# Patient Record
Sex: Male | Born: 1940
Health system: Southern US, Community
[De-identification: ages and names within clinical notes are randomized; demographics above are authoritative.]

## PROBLEM LIST (undated history)

## (undated) DIAGNOSIS — I4891 Unspecified atrial fibrillation: Secondary | ICD-10-CM

## (undated) DIAGNOSIS — R040 Epistaxis: Principal | ICD-10-CM

## (undated) DIAGNOSIS — I499 Cardiac arrhythmia, unspecified: Secondary | ICD-10-CM

## (undated) DIAGNOSIS — N529 Male erectile dysfunction, unspecified: Secondary | ICD-10-CM

## (undated) DIAGNOSIS — E785 Hyperlipidemia, unspecified: Secondary | ICD-10-CM

## (undated) DIAGNOSIS — I10 Essential (primary) hypertension: Secondary | ICD-10-CM

## (undated) DIAGNOSIS — M1990 Unspecified osteoarthritis, unspecified site: Secondary | ICD-10-CM

## (undated) HISTORY — PX: APPENDECTOMY: SHX54

## (undated) HISTORY — DX: Male erectile dysfunction, unspecified: N52.9

## (undated) HISTORY — PX: NASAL MASS EXCISION: SHX2067

---

## 2003-02-04 ENCOUNTER — Encounter: Admission: RE | Admit: 2003-02-04 | Discharge: 2003-05-05 | Payer: Self-pay | Admitting: Internal Medicine

## 2003-03-20 ENCOUNTER — Ambulatory Visit (HOSPITAL_COMMUNITY): Admission: RE | Admit: 2003-03-20 | Discharge: 2003-03-20 | Payer: Self-pay | Admitting: Gastroenterology

## 2005-01-12 ENCOUNTER — Encounter: Admission: RE | Admit: 2005-01-12 | Discharge: 2005-01-12 | Payer: Self-pay | Admitting: Internal Medicine

## 2006-11-24 ENCOUNTER — Encounter (INDEPENDENT_AMBULATORY_CARE_PROVIDER_SITE_OTHER): Payer: Self-pay | Admitting: Specialist

## 2006-11-24 ENCOUNTER — Ambulatory Visit (HOSPITAL_BASED_OUTPATIENT_CLINIC_OR_DEPARTMENT_OTHER): Admission: RE | Admit: 2006-11-24 | Discharge: 2006-11-24 | Payer: Self-pay | Admitting: Otolaryngology

## 2007-03-21 ENCOUNTER — Ambulatory Visit (HOSPITAL_COMMUNITY): Admission: RE | Admit: 2007-03-21 | Discharge: 2007-03-21 | Payer: Self-pay | Admitting: Cardiology

## 2007-04-10 ENCOUNTER — Ambulatory Visit (HOSPITAL_COMMUNITY): Admission: RE | Admit: 2007-04-10 | Discharge: 2007-04-10 | Payer: Self-pay | Admitting: Cardiology

## 2008-03-31 ENCOUNTER — Emergency Department (HOSPITAL_COMMUNITY): Admission: EM | Admit: 2008-03-31 | Discharge: 2008-04-01 | Payer: Self-pay | Admitting: *Deleted

## 2008-04-26 ENCOUNTER — Emergency Department (HOSPITAL_COMMUNITY): Admission: EM | Admit: 2008-04-26 | Discharge: 2008-04-27 | Payer: Self-pay | Admitting: Emergency Medicine

## 2010-08-16 ENCOUNTER — Encounter: Payer: Self-pay | Admitting: Cardiology

## 2010-12-07 NOTE — Op Note (Signed)
Robert Irwin, Robert Irwin                    ACCOUNT NO.:  [id number]   MEDICAL RECORD NO.:  [id number]          PATIENT TYPE:  OIB   LOCATION:  2856                         FACILITY:  [facility]   PHYSICIAN:  Jake Bathe, MD      DATE OF BIRTH:  [birth year]   DATE OF PROCEDURE:  03/21/2007  DATE OF DISCHARGE:  03/21/2007                               OPERATIVE REPORT   INDICATIONS:  Atrial fibrillation.   OPERATOR:  Jake Bathe, MD   PROCEDURE:  Transesophageal echo/ cardioversion.   DESCRIPTION OF PROCEDURE:  The patient was consented.  Risks and  benefits were explained with the daughter and wife and patient all  present.  The patient was then sedated with 5 mg of Versed and 50 mcg of  fentanyl and placed under conscious sedation.  Transesophageal  echocardiogram was then performed which demonstrated no evidence of left  atrial appendage thrombus.   Following the TEE, DC cardioversion was performed under systems with  anesthesia, Dr. Katrinka Blazing; 120 joules biphasic was administered and the  patient successfully cardioverted after 1 attempt.  Sinus rhythm was  documented with EKG.  An attempt to discuss the result of the study was  made with the family, however, they were unavailable post procedure.  There was mild electrical excoriation noted on chest wall and Silvadene  cream was applied.  The patient tolerated the procedure well without any  complications.   IMPRESSION:  1. No evidence of left atrial appendage thrombus.  2. Successful DC cardioversion.   Will see patient back in clinic in 2 weeks.  Continue Coumadin.      Jake Bathe, MD  Electronically Signed     MCS/MEDQ  D:  03/23/2007  T:  03/24/2007  Job:  [id number]

## 2010-12-10 NOTE — Op Note (Signed)
   NAMEOLLEN, RAO                              ACCOUNT NO.:  [id number]   MEDICAL RECORD NO.:  [id number]                   PATIENT TYPE:  AMB   LOCATION:  ENDO                                 FACILITY:  [facility]   PHYSICIAN:  Danise Edge, M.D.                DATE OF BIRTH:  04/09/1941   DATE OF PROCEDURE:  03/20/2003  DATE OF DISCHARGE:                                 OPERATIVE REPORT   PROCEDURE:  Screening colonoscopy.   PROCEDURE INDICATIONS:  Mr. Robert Irwin is a 70 year old [place] male born 1941/05/17.  Mr. Robert Irwin is scheduled to undergo his first screening colonoscopy  with polypectomy to prevent colon cancer.   ENDOSCOPIST:  Danise Edge, M.D.   PREMEDICATION:  Versed 5 mg, Demerol 50 mg.   DESCRIPTION OF PROCEDURE:  After obtaining informed consent, Mr. Robert Irwin was  placed in the left lateral decubitus position.  I administered intravenous  Demerol and intravenous Versed to achieve conscious sedation for the  procedure.  The patient's blood pressure, oxygen saturation, and cardiac  rhythm were monitored throughout the procedure and documented in the medical  record.   Anal inspection was normal.  Digital rectal exam was normal.  The prostate  was non-nodular.  The Olympus adult colonoscope was introduced into the  rectum and advanced to the cecum.  Colonic preparation for the exam today  was excellent.   Rectum normal.   Sigmoid colon and descending colon normal.   Splenic flexure normal.   Transverse colon normal.   Hepatic flexure normal.   Ascending colon normal.   Cecum and ileocecal valve normal.    ASSESSMENT:  Normal screening proctocolonoscopy to the cecum.  No endoscopic  evidence for the presence of colorectal neoplasia.                                               Danise Edge, M.D.    MJ/MEDQ  D:  03/20/2003  T:  03/20/2003  Job:  [id number]   cc:   Georgann Housekeeper, M.D.  [address]  [place]  [place] [address]  Fax: [phone]

## 2010-12-10 NOTE — Op Note (Signed)
NAMEABRAM, SAX                    ACCOUNT NO.:  [id number]   MEDICAL RECORD NO.:  [id number]          PATIENT TYPE:  AMB   LOCATION:  DSC                          FACILITY:  [facility]   PHYSICIAN:  Christopher E. 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   OPERATION:  Excision of right intranasal mass with a cauterization with  silver nitrate.   SURGEON:  Kristine Garbe. Ezzard Standing, M.D.   ANESTHESIA:  Local, 1% Xylocaine with epinephrine.   COMPLICATIONS:  None.   CLINICAL NOTE:  Robert Irwin is a 70 year old gentleman who has been having  recurrent right sided nose bleeds.  On exam in the office he has a  polypoid mass extending from the septum on the right side consistent  with probable pyogenic granuloma.  He is taken to the operating room  under local anesthetic for excision of right nasal mass and  cauterization with silver nitrate.   DESCRIPTION OF PROCEDURE:  Nose was injected with 0.50 mL Xylocaine with  epinephrine for local anesthetic.  The polypoid lesion was removed and  sent to pathology and the base of the lesion was cauterized with silver  nitrate.  This completed the procedure.  Tee tolerated it well and is  subsequently discharged home on Tylenol p.r.n. pain.  Will have him  follow up in my office in one week to review pathology.           ______________________________  Kristine Garbe Ezzard Standing, M.D.     CEN/MEDQ  D:  11/24/2006  T:  11/24/2006  Job:  [id number]

## 2011-03-10 ENCOUNTER — Other Ambulatory Visit: Payer: Self-pay | Admitting: Internal Medicine

## 2011-03-10 DIAGNOSIS — M79652 Pain in left thigh: Secondary | ICD-10-CM

## 2011-03-11 ENCOUNTER — Ambulatory Visit
Admission: RE | Admit: 2011-03-11 | Discharge: 2011-03-11 | Disposition: A | Discharge status: Home / Self Care | Payer: Medicare Other | Source: Ambulatory Visit | Attending: Internal Medicine | Admitting: Internal Medicine

## 2011-03-11 DIAGNOSIS — M79652 Pain in left thigh: Secondary | ICD-10-CM

## 2011-03-11 MED ORDER — GADOBENATE DIMEGLUMINE 529 MG/ML IV SOLN
14.0000 mL | Freq: Once | INTRAVENOUS | Status: AC | PRN
  Administered 2011-03-11: 14 mL via INTRAVENOUS

## 2011-04-27 LAB — POCT I-STAT, CHEM 8
BUN: 18
Glucose, Bld: 135 — ABNORMAL HIGH
Potassium: 4.5
TCO2: 30

## 2011-04-27 LAB — URINALYSIS, ROUTINE W REFLEX MICROSCOPIC
Bilirubin Urine: NEGATIVE
Ketones, ur: 15 — AB
Nitrite: POSITIVE — AB
Protein, ur: 100 — AB
Urobilinogen, UA: 0.2

## 2011-04-27 LAB — URINE CULTURE

## 2011-04-27 LAB — PROTIME-INR: INR: 2.2 — ABNORMAL HIGH

## 2011-08-24 DIAGNOSIS — Z7901 Long term (current) use of anticoagulants: Secondary | ICD-10-CM | POA: Diagnosis not present

## 2011-08-24 DIAGNOSIS — I4891 Unspecified atrial fibrillation: Secondary | ICD-10-CM | POA: Diagnosis not present

## 2011-08-24 DIAGNOSIS — E119 Type 2 diabetes mellitus without complications: Secondary | ICD-10-CM | POA: Diagnosis not present

## 2011-08-24 DIAGNOSIS — E785 Hyperlipidemia, unspecified: Secondary | ICD-10-CM | POA: Diagnosis not present

## 2011-08-31 DIAGNOSIS — R040 Epistaxis: Secondary | ICD-10-CM | POA: Diagnosis not present

## 2011-08-31 DIAGNOSIS — Z7901 Long term (current) use of anticoagulants: Secondary | ICD-10-CM | POA: Diagnosis not present

## 2011-08-31 DIAGNOSIS — Z1331 Encounter for screening for depression: Secondary | ICD-10-CM | POA: Diagnosis not present

## 2011-09-23 DIAGNOSIS — E119 Type 2 diabetes mellitus without complications: Secondary | ICD-10-CM | POA: Diagnosis not present

## 2011-10-05 DIAGNOSIS — Z7901 Long term (current) use of anticoagulants: Secondary | ICD-10-CM | POA: Diagnosis not present

## 2011-10-05 DIAGNOSIS — I4891 Unspecified atrial fibrillation: Secondary | ICD-10-CM | POA: Diagnosis not present

## 2011-11-16 DIAGNOSIS — I4891 Unspecified atrial fibrillation: Secondary | ICD-10-CM | POA: Diagnosis not present

## 2011-11-16 DIAGNOSIS — Z7901 Long term (current) use of anticoagulants: Secondary | ICD-10-CM | POA: Diagnosis not present

## 2011-11-21 DIAGNOSIS — J069 Acute upper respiratory infection, unspecified: Secondary | ICD-10-CM | POA: Diagnosis not present

## 2011-12-05 DIAGNOSIS — H1130 Conjunctival hemorrhage, unspecified eye: Secondary | ICD-10-CM | POA: Diagnosis not present

## 2011-12-05 DIAGNOSIS — H40029 Open angle with borderline findings, high risk, unspecified eye: Secondary | ICD-10-CM | POA: Diagnosis not present

## 2011-12-05 DIAGNOSIS — H5789 Other specified disorders of eye and adnexa: Secondary | ICD-10-CM | POA: Diagnosis not present

## 2011-12-05 DIAGNOSIS — H5710 Ocular pain, unspecified eye: Secondary | ICD-10-CM | POA: Diagnosis not present

## 2011-12-12 DIAGNOSIS — R040 Epistaxis: Secondary | ICD-10-CM | POA: Diagnosis not present

## 2011-12-13 DIAGNOSIS — R040 Epistaxis: Secondary | ICD-10-CM | POA: Diagnosis not present

## 2011-12-14 ENCOUNTER — Inpatient Hospital Stay (HOSPITAL_COMMUNITY)
Admission: EM | Admit: 2011-12-14 | Discharge: 2011-12-18 | DRG: 151 | Disposition: A | Discharge status: Home / Self Care | Payer: Medicare Other | Source: Ambulatory Visit | Attending: Internal Medicine | Admitting: Internal Medicine

## 2011-12-14 ENCOUNTER — Encounter (HOSPITAL_COMMUNITY): Payer: Self-pay | Admitting: *Deleted

## 2011-12-14 DIAGNOSIS — I4891 Unspecified atrial fibrillation: Secondary | ICD-10-CM | POA: Diagnosis present

## 2011-12-14 DIAGNOSIS — I10 Essential (primary) hypertension: Secondary | ICD-10-CM | POA: Diagnosis present

## 2011-12-14 DIAGNOSIS — E119 Type 2 diabetes mellitus without complications: Secondary | ICD-10-CM | POA: Diagnosis not present

## 2011-12-14 DIAGNOSIS — D62 Acute posthemorrhagic anemia: Secondary | ICD-10-CM | POA: Diagnosis not present

## 2011-12-14 DIAGNOSIS — Z7982 Long term (current) use of aspirin: Secondary | ICD-10-CM | POA: Diagnosis not present

## 2011-12-14 DIAGNOSIS — R791 Abnormal coagulation profile: Secondary | ICD-10-CM | POA: Diagnosis not present

## 2011-12-14 DIAGNOSIS — T45515A Adverse effect of anticoagulants, initial encounter: Secondary | ICD-10-CM | POA: Diagnosis present

## 2011-12-14 DIAGNOSIS — R040 Epistaxis: Secondary | ICD-10-CM | POA: Diagnosis not present

## 2011-12-14 DIAGNOSIS — Z7901 Long term (current) use of anticoagulants: Secondary | ICD-10-CM

## 2011-12-14 DIAGNOSIS — R338 Other retention of urine: Secondary | ICD-10-CM | POA: Diagnosis not present

## 2011-12-14 DIAGNOSIS — D649 Anemia, unspecified: Secondary | ICD-10-CM | POA: Diagnosis not present

## 2011-12-14 DIAGNOSIS — E785 Hyperlipidemia, unspecified: Secondary | ICD-10-CM

## 2011-12-14 DIAGNOSIS — R112 Nausea with vomiting, unspecified: Secondary | ICD-10-CM

## 2011-12-14 DIAGNOSIS — I498 Other specified cardiac arrhythmias: Secondary | ICD-10-CM | POA: Diagnosis not present

## 2011-12-14 HISTORY — DX: Epistaxis: R04.0

## 2011-12-14 HISTORY — DX: Hyperlipidemia, unspecified: E78.5

## 2011-12-14 HISTORY — DX: Unspecified atrial fibrillation: I48.91

## 2011-12-14 HISTORY — DX: Essential (primary) hypertension: I10

## 2011-12-14 LAB — POCT I-STAT, CHEM 8
Creatinine, Ser: 0.9 mg/dL (ref 0.50–1.35)
Hemoglobin: 7.8 g/dL — ABNORMAL LOW (ref 13.0–17.0)
Sodium: 139 mEq/L (ref 135–145)
TCO2: 23 mmol/L (ref 0–100)

## 2011-12-14 LAB — COMPREHENSIVE METABOLIC PANEL
ALT: 14 U/L (ref 0–53)
AST: 23 U/L (ref 0–37)
Albumin: 3.3 g/dL — ABNORMAL LOW (ref 3.5–5.2)
Alkaline Phosphatase: 46 U/L (ref 39–117)
Potassium: 4.2 mEq/L (ref 3.5–5.1)
Sodium: 135 mEq/L (ref 135–145)
Total Protein: 6.1 g/dL (ref 6.0–8.3)

## 2011-12-14 LAB — CBC
MCHC: 34.4 g/dL (ref 30.0–36.0)
RBC: 2.83 MIL/uL — ABNORMAL LOW (ref 4.22–5.81)
RDW: 13 % (ref 11.5–15.5)

## 2011-12-14 LAB — GLUCOSE, CAPILLARY

## 2011-12-14 LAB — DIFFERENTIAL
Basophils Relative: 0 % (ref 0–1)
Eosinophils Absolute: 0.1 10*3/uL (ref 0.0–0.7)
Lymphs Abs: 1.7 10*3/uL (ref 0.7–4.0)
Monocytes Absolute: 0.4 10*3/uL (ref 0.1–1.0)
Monocytes Relative: 4 % (ref 3–12)
Neutro Abs: 8.5 10*3/uL — ABNORMAL HIGH (ref 1.7–7.7)

## 2011-12-14 LAB — RETICULOCYTES
Retic Count, Absolute: 87.6 10*3/uL (ref 19.0–186.0)
Retic Ct Pct: 4 % — ABNORMAL HIGH (ref 0.4–3.1)

## 2011-12-14 LAB — PROTIME-INR: Prothrombin Time: 54.4 seconds — ABNORMAL HIGH (ref 11.6–15.2)

## 2011-12-14 MED ORDER — INSULIN ASPART 100 UNIT/ML ~~LOC~~ SOLN
0.0000 [IU] | Freq: Three times a day (TID) | SUBCUTANEOUS | Status: DC
  Administered 2011-12-16: 3 [IU] via SUBCUTANEOUS
  Administered 2011-12-17 – 2011-12-18 (×3): 2 [IU] via SUBCUTANEOUS

## 2011-12-14 MED ORDER — DILTIAZEM HCL 25 MG/5ML IV SOLN: 20.0000 mg | Freq: Once | INTRAVENOUS | Status: DC

## 2011-12-14 MED ORDER — INSULIN ASPART 100 UNIT/ML ~~LOC~~ SOLN: 0.0000 [IU] | Freq: Every day | SUBCUTANEOUS | Status: DC

## 2011-12-14 MED ORDER — DILTIAZEM HCL 90 MG PO TABS
90.0000 mg | ORAL_TABLET | Freq: Three times a day (TID) | ORAL | Status: DC
  Administered 2011-12-14 – 2011-12-17 (×8): 90 mg via ORAL
  Filled 2011-12-14 (×11): qty 1

## 2011-12-14 MED ORDER — ONDANSETRON HCL 4 MG/2ML IJ SOLN: 4.0000 mg | Freq: Four times a day (QID) | INTRAMUSCULAR | Status: DC | PRN

## 2011-12-14 MED ORDER — ACETAMINOPHEN 325 MG PO TABS
650.0000 mg | ORAL_TABLET | Freq: Four times a day (QID) | ORAL | Status: DC | PRN
  Administered 2011-12-14 – 2011-12-18 (×6): 650 mg via ORAL
  Filled 2011-12-14 (×6): qty 2

## 2011-12-14 MED ORDER — LISINOPRIL 2.5 MG PO TABS
2.5000 mg | ORAL_TABLET | Freq: Every day | ORAL | Status: DC
  Filled 2011-12-14 (×3): qty 1

## 2011-12-14 MED ORDER — ONDANSETRON HCL 4 MG PO TABS: 4.0000 mg | ORAL_TABLET | Freq: Four times a day (QID) | ORAL | Status: DC | PRN

## 2011-12-14 MED ORDER — METOPROLOL TARTRATE 1 MG/ML IV SOLN
5.0000 mg | INTRAVENOUS | Status: DC | PRN
  Filled 2011-12-14: qty 5

## 2011-12-14 MED ORDER — DILTIAZEM HCL 100 MG IV SOLR
5.0000 mg/h | Freq: Once | INTRAVENOUS | Status: AC
  Administered 2011-12-14: 5 mg/h via INTRAVENOUS
  Filled 2011-12-14: qty 100

## 2011-12-14 MED ORDER — SODIUM CHLORIDE 0.9 % IJ SOLN
3.0000 mL | Freq: Two times a day (BID) | INTRAMUSCULAR | Status: DC
  Administered 2011-12-15 – 2011-12-18 (×4): 3 mL via INTRAVENOUS

## 2011-12-14 MED ORDER — GLIMEPIRIDE 1 MG PO TABS
1.0000 mg | ORAL_TABLET | Freq: Every day | ORAL | Status: DC
  Administered 2011-12-15 – 2011-12-18 (×3): 1 mg via ORAL
  Filled 2011-12-14 (×5): qty 1

## 2011-12-14 MED ORDER — SODIUM CHLORIDE 0.9 % IV SOLN
INTRAVENOUS | Status: DC
  Administered 2011-12-14 – 2011-12-16 (×4): via INTRAVENOUS

## 2011-12-14 MED ORDER — SODIUM CHLORIDE 0.9 % IV BOLUS (SEPSIS)
1000.0000 mL | INTRAVENOUS | Status: AC
  Administered 2011-12-14: 1000 mL via INTRAVENOUS

## 2011-12-14 NOTE — H&P (Signed)
PCP:   Georgann Housekeeper, MD, MD  ENT: Dr. Ezzard Standing  Chief Complaint:  Nosebleed   HPI: This is a pleasant 71 year old gentleman who developed nose bleeding on Sunday. He saw his ENT Dr. Ezzard Standing on Tuesday and Wednesday, he had cauterization done and his nares were packed today.   After each cauterization, he was sent home but continued to bleed profusely. Dr. Ezzard Standing sent to the cardiologist today where his INR was checked. Patient is on Coumadin for atrial fibrillation. His INR was 6.8, he was given vitamin K by mouth and sent to the ER. In the ER patient was tachycardic with heart rates up to 160s. The patient does report feeling dizzy and lightheaded. Today had nausea and vomiting with old blood. He also reports black stools. The patient has a history of a right nasal mass and that was excised approximately 5 years ago. Patient denies any chest pains. History provided by the patient. Dr. Ezzard Standing is aware and states he was to the patient in the a.m.  Review of Systems: Positives bolded   anorexia, fever, weight loss,, vision loss, decreased hearing, hoarseness, chest pain, syncope, dyspnea on exertion, peripheral edema, balance deficits, hemoptysis, abdominal pain, melena, hematochezia, severe indigestion/heartburn, hematuria, incontinence, genital sores, muscle weakness, suspicious skin lesions, transient blindness, difficulty walking, depression, unusual weight change, abnormal bleeding, enlarged lymph nodes, angioedema, and breast masses.  Past Medical History: Past Medical History  Diagnosis Date  . A-fib   . Hyperlipidemia   . Diabetes mellitus   . Hypertension    Past Surgical History  Procedure Date  . Nasal mass excision     Medications: Prior to Admission medications   Medication Sig Start Date End Date Taking? Authorizing Provider  aspirin 81 MG tablet Take 81 mg by mouth daily. Take three times a week.   Yes Historical Provider, MD  atorvastatin (LIPITOR) 20 MG tablet Take 20 mg by  mouth daily.   Yes Historical Provider, MD  calcium citrate-vitamin D 200-200 MG-UNIT TABS Take 1 tablet by mouth daily.   Yes Historical Provider, MD  diltiazem (TIAZAC) 240 MG 24 hr capsule Take 240 mg by mouth daily.   Yes Historical Provider, MD  fish oil-omega-3 fatty acids 1000 MG capsule Take 2 g by mouth daily.   Yes Historical Provider, MD  fluocinonide ointment (LIDEX) 0.05 % Apply 1 application topically 2 (two) times daily.   Yes Historical Provider, MD  glimepiride (AMARYL) 2 MG tablet Take 1 mg by mouth daily before breakfast.   Yes Historical Provider, MD  GLUCOSAMINE PO Take 1 tablet by mouth daily. Hold while in hospital   Yes Historical Provider, MD  lisinopril (PRINIVIL,ZESTRIL) 2.5 MG tablet Take 2.5 mg by mouth daily.   Yes Historical Provider, MD  Multiple Vitamin (MULITIVITAMIN WITH MINERALS) TABS Take 1 tablet by mouth daily.   Yes Historical Provider, MD  phenylephrine (NEO-SYNEPHRINE) 0.5 % nasal solution Place 1 drop into the nose every 4 (four) hours as needed.   Yes Historical Provider, MD  warfarin (COUMADIN) 2.5 MG tablet Take 2.5 mg by mouth daily.   Yes Historical Provider, MD    Allergies:  No Known Allergies  Social History:  reports that he has never smoked. He does not have any smokeless tobacco history on file. He reports that he drinks alcohol. He reports that he does not use illicit drugs.  Family History: History reviewed. No pertinent family history.  Physical Exam: Filed Vitals:   12/14/11 1650 12/14/11 1704 12/14/11 1800  BP:  91/60  129/69  Pulse: 167    Temp:  97.9 F (36.6 C)   TempSrc:  Axillary   Resp: 21  19  SpO2: 98%      General:  Alert and oriented times three, well developed and nourished, no acute distress Eyes: PERRLA, pink conjunctiva, no scleral icterus ENT: Moist oral mucosa, neck supple, no thyromegaly, nasal packing in place Lungs: clear to ascultation, no wheeze, no crackles, no use of accessory  muscles Cardiovascular: irregular rate and rhythm, no regurgitation, no gallops, no murmurs. No carotid bruits, no JVD Abdomen: soft, positive BS, non-tender, non-distended, no organomegaly, not an acute abdomen GU: not examined Neuro: CN II - XII grossly intact, sensation intact Musculoskeletal: strength 5/5 all extremities, no clubbing, cyanosis or edema Skin: no rash, no subcutaneous crepitation, no decubitus Psych: appropriate patient   Labs on Admission:   [facility] 12/14/11 1816 12/14/11 1719  NA 139 135  K 4.7 4.2  CL 106 100  CO2 -- 24  GLUCOSE 155* 216*  BUN 30* 31*  CREATININE 0.90 0.87  CALCIUM -- 8.4  MG -- --  PHOS -- --    Basename 12/14/11 1719  AST 23  ALT 14  ALKPHOS 46  BILITOT 1.1  PROT 6.1  ALBUMIN 3.3*   No results found for this basename: LIPASE:2,AMYLASE:2 in the last 72 hours  Basename 12/14/11 1816 12/14/11 1719  WBC -- 10.8*  NEUTROABS -- 8.5*  HGB 7.8* 9.7*  HCT 23.0* 28.2*  MCV -- 99.6  PLT -- 209   No results found for this basename: CKTOTAL:3,CKMB:3,CKMBINDEX:3,TROPONINI:3 in the last 72 hours No components found with this basename: POCBNP:3 No results found for this basename: DDIMER:2 in the last 72 hours No results found for this basename: HGBA1C:2 in the last 72 hours No results found for this basename: CHOL:2,HDL:2,LDLCALC:2,TRIG:2,CHOLHDL:2,LDLDIRECT:2 in the last 72 hours No results found for this basename: TSH,T4TOTAL,FREET3,T3FREE,THYROIDAB in the last 72 hours No results found for this basename: VITAMINB12:2,FOLATE:2,FERRITIN:2,TIBC:2,IRON:2,RETICCTPCT:2 in the last 72 hours  Micro Results: No results found for this or any previous visit (from the past 240 hour(s)).   Radiological Exams on Admission: No results found.   EKG: A. fib with RVR  Assessment/Plan Present on Admission:  .Epistaxis Hypercoagulable state due to Coumadin Admit to telemetry FFP ordered for reversal of Coumadin Patient received vitamin K at  the cardiologist's office Patient's with nasal packing, Dr. Ezzard Standing to see patient in a.m. Serial H&H's  Hematemesis  and melena likely due to due to old swallowed blood Of note: On presentation to ER patient had i-STAT the hemoglobin was 7.8, confirmation her that H&H showed a hemoglobin of 9.7   .Atrial fibrillation with RVR discontinue Coumadin by mouth, Lopressor when necessary Coumadin being reversed Diabetes mellitus Hypertension Dyslipidemia On present medications will be continued  Full code Note the for DVT prophylaxis T./Dr. Domingo Madeira, Dorien Mayotte 12/14/2011, 7:33 PM

## 2011-12-14 NOTE — ED Notes (Signed)
Critical lab INR 6.00. MD advised.

## 2011-12-14 NOTE — ED Notes (Signed)
Patient was seen by ENT this am for nosebleed and bilateral were packed in office.  Patient called EMS for continued bleeding- vomiting 1500 CC enroute of dark blood with multiple clots. Sees Dr. Ezzard Standing for same.  Patient states "he is on coumadin and level was drawn today and was 7.6 in office.

## 2011-12-14 NOTE — ED Provider Notes (Signed)
History     CSN: [id number]  Arrival date & time 12/14/11  1645   None     Chief Complaint  Patient presents with  . Epistaxis    (Consider location/radiation/quality/duration/timing/severity/associated sxs/prior treatment) Patient is a 71 y.o. male presenting with nosebleeds. The history is provided by the spouse and the patient.  Epistaxis  This is a new problem. The current episode started 2 days ago. Episode frequency: intermittent. The problem has been gradually improving. The problem is associated with anticoagulants. The bleeding has been from both nares. Treatments tried: currently has packing in place, reports cautery stick by ENT. The treatment provided moderate relief.    Past Medical History  Diagnosis Date  . A-fib   . Hyperlipidemia   . Diabetes mellitus   . Hypertension     No past surgical history on file.  No family history on file.  History  Substance Use Topics  . Smoking status: Not on file  . Smokeless tobacco: Not on file  . Alcohol Use:       Review of Systems  Constitutional: Negative for fever.  HENT: Positive for nosebleeds. Negative for rhinorrhea, drooling and neck pain.   Eyes: Negative for pain.  Respiratory: Negative for cough and shortness of breath.   Cardiovascular: Negative for chest pain and leg swelling.  Gastrointestinal: Negative for nausea, vomiting, abdominal pain and diarrhea.  Genitourinary: Negative for dysuria and hematuria.  Musculoskeletal: Negative for gait problem.  Skin: Negative for color change.  Neurological: Positive for dizziness. Negative for numbness and headaches.  Hematological: Negative for adenopathy.  Psychiatric/Behavioral: Negative for behavioral problems.  All other systems reviewed and are negative.    Allergies  Review of patient's allergies indicates no known allergies.  Home Medications   Current Outpatient Rx  Name Route Sig Dispense Refill  . ASPIRIN 81 MG PO TABS Oral Take 81 mg by  mouth daily. Take three times a week.    . ATORVASTATIN CALCIUM 20 MG PO TABS Oral Take 20 mg by mouth daily.    Marland Kitchen CALCIUM CITRATE-VITAMIN D 200-200 MG-UNIT PO TABS Oral Take 1 tablet by mouth daily.    Marland Kitchen DILTIAZEM HCL ER BEADS 240 MG PO CP24 Oral Take 240 mg by mouth daily.    . OMEGA-3 FATTY ACIDS 1000 MG PO CAPS Oral Take 2 g by mouth daily.    Marland Kitchen FLUOCINONIDE 0.05 % EX OINT Topical Apply 1 application topically 2 (two) times daily.    Marland Kitchen GLIMEPIRIDE 2 MG PO TABS Oral Take 1 mg by mouth daily before breakfast.    . GLUCOSAMINE PO Oral Take 1 tablet by mouth daily. Hold while in hospital    . LISINOPRIL 2.5 MG PO TABS Oral Take 2.5 mg by mouth daily.    . ADULT MULTIVITAMIN W/MINERALS CH Oral Take 1 tablet by mouth daily.    Marland Kitchen PHENYLEPHRINE HCL 0.5 % NA SOLN Nasal Place 1 drop into the nose every 4 (four) hours as needed.    . WARFARIN SODIUM 2.5 MG PO TABS Oral Take 2.5 mg by mouth daily.      BP 91/60  Pulse 167  Temp(Src) 97.9 F (36.6 C) (Axillary)  Resp 21  SpO2 98%  Physical Exam  Constitutional: He is oriented to person, place, and time. He appears well-developed and well-nourished.  HENT:  Head: Normocephalic and atraumatic.  Right Ear: External ear normal.  Left Ear: External ear normal.  Nose: Nose normal.  Mouth/Throat: Oropharynx is clear and moist. No  oropharyngeal exudate.       Bilateral nasal packing noted. Mild oozing of blood bilaterally. No active bleeding noted in posterior pharynx.   Eyes: Conjunctivae and EOM are normal. Pupils are equal, round, and reactive to light.  Neck: Normal range of motion. Neck supple.  Cardiovascular: Normal heart sounds and intact distal pulses.  Exam reveals no gallop and no friction rub.   No murmur heard.      150's, A fib w/ RVR   Pulmonary/Chest: Effort normal and breath sounds normal. No respiratory distress. He has no wheezes.  Abdominal: Soft. Bowel sounds are normal. He exhibits no distension. There is no tenderness.    Musculoskeletal: Normal range of motion. He exhibits no edema and no tenderness.  Neurological: He is alert and oriented to person, place, and time.  Skin: Skin is warm and dry.  Psychiatric: He has a normal mood and affect. His behavior is normal.    ED Course  Procedures (including critical care time)   Labs Reviewed  CBC  DIFFERENTIAL  COMPREHENSIVE METABOLIC PANEL  PROTIME-INR  TYPE AND SCREEN   No results found.   No diagnosis found.   Date: 12/14/2011  Rate: 158  Rhythm: atrial fibrillation  QRS Axis: normal  Intervals: QT prolonged  ST/T Wave abnormalities: normal  Conduction Disutrbances:none  Narrative Interpretation: Afib w/ RVR, no ST or T wave changes cw ischemia  Old EKG Reviewed: changes noted    MDM  5:13 PM 71 y.o. male pw epistaxis for 2 days. Pt has seen his ENT, Dr. Ezzard Standing, two days ago and today. Pt noted to have Hgb 10.5 and INR approx 6 this am, he was given po vitamin K. Pt notes he had continued mild epistaxis and dizziness. Pt is tachycardic here to 150's, found to be in afib w/ RVR, BP 91/60. Pt mentating well on exam. Will get labs, IVF, cardizem for rate. Pt notes he did not take his meds this am.   HR now controlled. Pt feeling better on exam. INR remains elevated. Hgb dropped to 9.7. Dr. Radford Pax has spoken w/ Dr. Ezzard Standing, plan for admission to medicine service.   Clinical Impression 1. Epistaxis   2. Elevated INR            Purvis Sheffield, MD 12/14/11 2339

## 2011-12-15 DIAGNOSIS — R040 Epistaxis: Secondary | ICD-10-CM

## 2011-12-15 DIAGNOSIS — I4891 Unspecified atrial fibrillation: Secondary | ICD-10-CM

## 2011-12-15 DIAGNOSIS — T45515A Adverse effect of anticoagulants, initial encounter: Secondary | ICD-10-CM

## 2011-12-15 DIAGNOSIS — R112 Nausea with vomiting, unspecified: Secondary | ICD-10-CM

## 2011-12-15 LAB — GLUCOSE, CAPILLARY: Glucose-Capillary: 112 mg/dL — ABNORMAL HIGH (ref 70–99)

## 2011-12-15 LAB — DIFFERENTIAL
Eosinophils Relative: 2 % (ref 0–5)
Lymphocytes Relative: 22 % (ref 12–46)
Lymphs Abs: 1.4 10*3/uL (ref 0.7–4.0)
Monocytes Absolute: 0.5 10*3/uL (ref 0.1–1.0)

## 2011-12-15 LAB — CBC
HCT: 25.3 % — ABNORMAL LOW (ref 39.0–52.0)
MCH: 32.3 pg (ref 26.0–34.0)
MCV: 94.1 fL (ref 78.0–100.0)
Platelets: 148 10*3/uL — ABNORMAL LOW (ref 150–400)
RBC: 1.94 MIL/uL — ABNORMAL LOW (ref 4.22–5.81)
RBC: 2.69 MIL/uL — ABNORMAL LOW (ref 4.22–5.81)
RDW: 13.5 % (ref 11.5–15.5)
WBC: 6.1 10*3/uL (ref 4.0–10.5)
WBC: 6.3 10*3/uL (ref 4.0–10.5)

## 2011-12-15 LAB — PREPARE RBC (CROSSMATCH)

## 2011-12-15 LAB — BASIC METABOLIC PANEL
CO2: 29 mEq/L (ref 19–32)
Calcium: 8.8 mg/dL (ref 8.4–10.5)
Creatinine, Ser: 0.68 mg/dL (ref 0.50–1.35)
GFR calc Af Amer: >90 mL/min (ref >90)
GFR calc non Af Amer: >90 mL/min (ref >90)
Sodium: 143 mEq/L (ref 135–145)

## 2011-12-15 LAB — FERRITIN: Ferritin: 111 ng/mL (ref 22–322)

## 2011-12-15 LAB — IRON AND TIBC
Iron: 38 ug/dL — ABNORMAL LOW (ref 42–135)
UIBC: 211 ug/dL (ref 125–400)

## 2011-12-15 LAB — PROTIME-INR: INR: 1.51 — ABNORMAL HIGH (ref 0.00–1.49)

## 2011-12-15 MED ORDER — WHITE PETROLATUM GEL
Status: AC
  Administered 2011-12-15: 22:00:00
  Filled 2011-12-15: qty 5

## 2011-12-15 MED ORDER — LORAZEPAM 1 MG PO TABS: 1.0000 mg | ORAL_TABLET | Freq: Four times a day (QID) | ORAL | Status: DC | PRN

## 2011-12-15 MED ORDER — OXYCODONE-ACETAMINOPHEN 5-325 MG PO TABS
1.0000 | ORAL_TABLET | ORAL | Status: DC | PRN
  Administered 2011-12-15: 1 via ORAL
  Administered 2011-12-16 – 2011-12-17 (×2): 2 via ORAL
  Filled 2011-12-15 (×2): qty 2
  Filled 2011-12-15: qty 1

## 2011-12-15 MED ORDER — VITAMIN B-1 100 MG PO TABS
100.0000 mg | ORAL_TABLET | Freq: Every day | ORAL | Status: DC
  Administered 2011-12-15 – 2011-12-18 (×3): 100 mg via ORAL
  Filled 2011-12-15 (×4): qty 1

## 2011-12-15 MED ORDER — LORAZEPAM 2 MG/ML IJ SOLN: 1.0000 mg | Freq: Four times a day (QID) | INTRAMUSCULAR | Status: DC | PRN

## 2011-12-15 MED ORDER — THIAMINE HCL 100 MG/ML IJ SOLN
100.0000 mg | Freq: Every day | INTRAMUSCULAR | Status: DC
  Filled 2011-12-15 (×2): qty 1

## 2011-12-15 MED ORDER — FOLIC ACID 1 MG PO TABS
1.0000 mg | ORAL_TABLET | Freq: Every day | ORAL | Status: DC
  Administered 2011-12-15 – 2011-12-18 (×3): 1 mg via ORAL
  Filled 2011-12-15 (×4): qty 1

## 2011-12-15 MED ORDER — ADULT MULTIVITAMIN W/MINERALS CH
1.0000 | ORAL_TABLET | Freq: Every day | ORAL | Status: DC
  Administered 2011-12-15 – 2011-12-18 (×3): 1 via ORAL
  Filled 2011-12-15 (×4): qty 1

## 2011-12-15 NOTE — ED Provider Notes (Signed)
I saw and evaluated the patient, reviewed the resident's note and I agree with the findings and plan.   .Face to face Exam:  General:  Awake HEENT:  Atraumatic Resp:  Normal effort Abd:  Nondistended Neuro:No focal weakness Lymph: No adenopathy   Nelia Shi, MD 12/15/11 2350

## 2011-12-15 NOTE — Clinical Documentation Improvement (Signed)
Anemia Blood Loss Clarification  THIS DOCUMENT IS NOT A PERMANENT PART OF THE MEDICAL RECORD  RESPOND TO THE THIS QUERY, FOLLOW THE INSTRUCTIONS BELOW:  1. If needed, update documentation for the patient's encounter via the notes activity.  2. Access this query again and click edit on the In [employer].  3. After updating, or not, click F2 to complete all highlighted (required) fields concerning your review. Select "additional documentation in the medical record" OR "no additional documentation provided".  4. Click Sign note button.  5. The deficiency will fall out of your In Basket *Please let us know if you are not able to complete this workflow by phone or e-mail (listed below).        12/15/11  Dear Dr. Isidoro Donning Marton Redwood  In an effort to better capture your patient's severity of illness, reflect appropriate length of stay and utilization of resources, a review of the patient medical record has revealed the following indicators.    Based on your clinical judgment, please clarify and document in a progress note and/or discharge summary the clinical condition associated with the following supporting information:  In responding to this query please exercise your independent judgment.  The fact that a query is asked, does not imply that any particular answer is desired or expected.  Possible Clinical Conditions?   Acute Blood Loss Anemia  Acute on chronic blood loss anemia  Chronic blood loss anemia  Other Condition  [place] Clinically Determine  Supporting Information:  Risk Factors: (As per H&P and PN) ".Epistaxis", "recent significant nosebleed"    Signs and Symptoms:(As per H&P and PN) "hemoglobin was 7.8, confirmation her that H&H showed a hemoglobin of 9.7"    Diagnostics: Component Hemoglobin HCT  Latest Ref Rng 13.0 - 17.0 g/dL 96.2 - 95.2 %  8/41/3244    12/14/2011    12/14/2011    12/14/2011    12/14/2011    12/14/2011    12/14/2011 9.7 (L) 28.2 (L)    12/14/2011 7.8 (L) 23.0 (L)  12/14/2011    12/14/2011    12/14/2011    12/14/2011 7.6 (L) 21.6 (L)  12/14/2011    12/14/2011    12/14/2011    12/14/2011    12/14/2011    12/14/2011    12/15/2011    12/15/2011 6.6 (LL) 19.5 (L)  12/15/2011    12/15/2011    12/15/2011    12/15/2011      Treatments: Transfusion:(As per notes) "FFP ordered for reversal of Coumadin"    IV fluids / plasma expanders:(As per notes) "He has received FFP, he is currently on his second unit of packed red blood cells. His INR was 1.5"  Serial H&H monitoring Medications (As per notes) "Patient received vitamin K at the cardiologist's office" "On Wednesday, May 22, he received 5 mg of p.o. Vitamin K."  Reviewed: additional documentation in the medical record  Thank Bonita Quin  Joanette Gula [facility] RN,BSN  Clinical Documentation Specialist: [phone] Pager Health Information Management [place]

## 2011-12-15 NOTE — Progress Notes (Signed)
71 year old male with persistent atrial fibrillation, on chronic anticoagulation with warfarin therapy with recent significant nosebleed admitted to hospital with dizziness, significantly decreased hemoglobin.  He has received FFP, he is currently on his second unit of packed red blood cells. His INR was 1.5.  On Wednesday, May 22, he received 5 mg of p.o. Vitamin K.  I came by today to visit him, courtesy visit. His daughter and her husband were both present.  After leaving our office yesterday, he felt worse, dizziness increased, hematemesis/melena from nosebleed. He heeded our instruction and went to the emergency department.  Is atrial fibrillation demonstrated rapid ventricular response at first but now is slowly returning to normal with diltiazem p.o. As well as resuscitation.  Dr. Ezzard Standing will be taking him in tomorrow for exploratory surgery and possible further cauterization.  I discussed his case with Dr. Donette Larry, his primary physician. I reiterated to him that Robert Irwin has been extremely stable from his INR standpoint. He was just in [place] head and admits to excessive alcohol use which is the only explainable culprit for increasing INR. He does admit to daily drinking however.  Please let us know if we can be of assistance.

## 2011-12-15 NOTE — Progress Notes (Signed)
Nursing Note  Pt received a total of 3 units FFP's. No problems or reactions noted. Pt tolerated well. Will monitor. C.Lakresha Stifter, RN.

## 2011-12-15 NOTE — Progress Notes (Signed)
CRITICAL VALUE ALERT  Critical value received:  Hgb 6.6  Date of notification:  12/15/11  Time of notification:  0606  Critical value read back:yes  Nurse who received alert:  C.Jaece Ducharme, RN  MD notified (1st page):  K.Kirby, NP  Time of first page:  0607  Responding MD:  Donnamarie Poag, NP  Time MD responded:  1610 - orders placed in EPIC

## 2011-12-15 NOTE — Progress Notes (Signed)
Patient ID: CAS TRACZ  male  ZOX:[id number]    DOB: Jun 13, 1941    DOA: 12/14/2011  PCP: Georgann Housekeeper, MD, MD  Subjective: No active bleeding currently, hemoglobin however 6.6. Nasal packing noted  Objective: Weight change:   Intake/Output Summary (Last 24 hours) at 12/15/11 1450 Last data filed at 12/15/11 1300  Gross per 24 hour  Intake 2989.26 ml  Output      0 ml  Net 2989.26 ml   Blood pressure 104/53, pulse 90, temperature 98 F (36.7 C), temperature source Oral, resp. rate 20, height 5\' 7"  (1.702 m), weight 69.31 kg (152 lb 12.8 oz), SpO2 99.00%.  Physical Exam: General: Alert and awake, oriented x3, not in any acute distress. HEENT: anicteric sclera, pupils reactive to light and accommodation, EOMI nasal packing CVS: S1-S2 clear, no murmur rubs or gallops Chest: clear to auscultation bilaterally, no wheezing, rales or rhonchi Abdomen: soft nontender, nondistended, normal bowel sounds, no organomegaly Extremities: no cyanosis, clubbing or edema noted bilaterally   Lab Results: Basic Metabolic Panel:  Lab 12/15/11 8119 12/14/11 1816 12/14/11 1719  NA 143 139 --  K 4.3 4.7 --  CL 107 106 --  CO2 29 -- 24  GLUCOSE 113* 155* --  BUN 15 30* --  CREATININE 0.68 0.90 --  CALCIUM 8.8 -- 8.4  MG -- -- --  PHOS -- -- --   Liver Function Tests:  Lab 12/14/11 1719  AST 23  ALT 14  ALKPHOS 46  BILITOT 1.1  PROT 6.1  ALBUMIN 3.3*   CBC:  Lab 12/15/11 0500 12/14/11 2104 12/14/11 1719  WBC 6.1 -- 10.8*  NEUTROABS -- -- 8.5*  HGB 6.6* 7.6* --  HCT 19.5* 21.6* --  MCV 100.5* -- 99.6  PLT 148* -- 209   CBG:  Lab 12/15/11 1123 12/15/11 0723 12/14/11 2214  GLUCAP 99 112* 109*      Studies/Results: No results found.  Medications: Scheduled Meds:   . diltiazem (CARDIZEM) infusion  5-15 mg/hr Intravenous Once  . diltiazem  90 mg Oral Q8H  . glimepiride  1 mg Oral QAC breakfast  . insulin aspart  0-15 Units Subcutaneous TID WC  . insulin aspart  0-5 Units  Subcutaneous QHS  . lisinopril  2.5 mg Oral Daily  . sodium chloride  1,000 mL Intravenous STAT  . sodium chloride  3 mL Intravenous Q12H  . DISCONTD: diltiazem  20 mg Intravenous Once   Continuous Infusions:   . sodium chloride 100 mL/hr at 12/15/11 1300     Assessment/Plan: Principal Problem:  *Epistaxis: Recurrent bilateral status post cauterization and packing, status post vitamin K and FFP - No active bleeding currently however hemoglobin down to 6.6 - Continue packing. Surgery following, plan for OR for removal of packing nasal exam and cauterization on 12/16/2011, discussed with Dr. Ezzard Standing this morning.  Acute blood loss anemia: Secondary to epistaxis - Transfuse 2 units of packed RBCs. Recheck hemoglobin after the second unit.  Alcohol use: Not currently in withdrawals however been placed on CIWA scale. Patient was strongly counseled for alcohol cessation.   Hyperlipidemia   Diabetes mellitus:  Well-controlled   Hypertension   Atrial fibrillation with RVR: Off Cardizem drip, continue PO Cardizem. Cardiology following, off Coumadin currently. INR 1.5 after vitamin K and FFP  DVT Prophylaxis: SCDs  Code Status: Full code  Disposition: Not medically ready   LOS: 1 day   Davy Faught M.D. [facility] 12/15/2011, 2:50 PM Pager: [phone]

## 2011-12-15 NOTE — Progress Notes (Signed)
Admission Note  Pt arrived to 6734 via stretcher from the ED. A&Ox4, spouse at bedside. No distress noted, except for obvious packing/bleeding in bilateral nares. Pt arrived to unit with cardizem drip infusing - K.Kirby, NP notified and she stated this order was to be d/c'd prior to arriving to the floor. Pt has PO cardizem ordered. Pt states he's been having this nose bleed for several days now and was scheduled for a procedure on Thursday, but came to the hospital because he was feeling worse. VSS. Skin intact. No contact precautions. Oriented pt to unit and surroundings. Call bell within reach. Informed pt and family he would be receiving 3 units FFP's tonight; family agrees with this plan. Will continue to monitor. C.Baily Serpe, RN.

## 2011-12-15 NOTE — Consult Note (Signed)
Patient with hx of recurrent bilateral epistaxis seen in my office Tues.and Wed. For cauterization and packing. Because of persistent bleeding and patient on coumadin for chronic a fib  INR was obtained Wed. And was 6.8. He received  Vit K and coumadin was stopped but he continued to bleed and was tachy resulting in ER visit and admission.    Hgb dropped to 6.8 . He received FFP and  2 units PRBC. His bleeding has subsequently stopped but still has packing in place.     I will plan to take patient to OR for removal of packing , nasal exam and cauterization in AM 5/24.

## 2011-12-16 ENCOUNTER — Encounter (HOSPITAL_COMMUNITY): Payer: Self-pay | Admitting: Anesthesiology

## 2011-12-16 ENCOUNTER — Encounter (HOSPITAL_COMMUNITY)
Admission: EM | Disposition: A | Discharge status: Home / Self Care | Payer: Self-pay | Source: Ambulatory Visit | Attending: Internal Medicine

## 2011-12-16 ENCOUNTER — Inpatient Hospital Stay (HOSPITAL_COMMUNITY): Payer: Medicare Other | Admitting: Anesthesiology

## 2011-12-16 DIAGNOSIS — R040 Epistaxis: Secondary | ICD-10-CM | POA: Diagnosis not present

## 2011-12-16 DIAGNOSIS — D649 Anemia, unspecified: Secondary | ICD-10-CM

## 2011-12-16 HISTORY — PX: NASAL SINUS SURGERY: SHX719

## 2011-12-16 LAB — PREPARE FRESH FROZEN PLASMA
Unit division: 0
Unit division: 0

## 2011-12-16 LAB — CBC
Hemoglobin: 9.2 g/dL — ABNORMAL LOW (ref 13.0–17.0)
MCH: 33.2 pg (ref 26.0–34.0)
MCV: 95.7 fL (ref 78.0–100.0)
RBC: 2.77 MIL/uL — ABNORMAL LOW (ref 4.22–5.81)

## 2011-12-16 LAB — TYPE AND SCREEN
Antibody Screen: NEGATIVE
Unit division: 0

## 2011-12-16 LAB — GLUCOSE, CAPILLARY
Glucose-Capillary: 122 mg/dL — ABNORMAL HIGH (ref 70–99)
Glucose-Capillary: 135 mg/dL — ABNORMAL HIGH (ref 70–99)

## 2011-12-16 SURGERY — SINUS SURGERY, ENDOSCOPIC
Anesthesia: General | Site: Nose | Wound class: Clean Contaminated

## 2011-12-16 MED ORDER — ONDANSETRON HCL 4 MG/2ML IJ SOLN: 4.0000 mg | Freq: Once | INTRAMUSCULAR | Status: DC | PRN

## 2011-12-16 MED ORDER — OXYMETAZOLINE HCL 0.05 % NA SOLN
NASAL | Status: DC | PRN
  Administered 2011-12-16: 1 via NASAL

## 2011-12-16 MED ORDER — FENTANYL CITRATE 0.05 MG/ML IJ SOLN
INTRAMUSCULAR | Status: DC | PRN
  Administered 2011-12-16: 50 ug via INTRAVENOUS

## 2011-12-16 MED ORDER — LACTATED RINGERS IV SOLN
INTRAVENOUS | Status: DC
  Administered 2011-12-16 (×2): via INTRAVENOUS

## 2011-12-16 MED ORDER — LIDOCAINE HCL (CARDIAC) 20 MG/ML IV SOLN
INTRAVENOUS | Status: DC | PRN
  Administered 2011-12-16: 100 mg via INTRAVENOUS

## 2011-12-16 MED ORDER — CEPHALEXIN 500 MG PO CAPS: 500.0000 mg | ORAL_CAPSULE | Freq: Two times a day (BID) | ORAL | Status: AC

## 2011-12-16 MED ORDER — SUCCINYLCHOLINE CHLORIDE 20 MG/ML IJ SOLN
INTRAMUSCULAR | Status: DC | PRN
  Administered 2011-12-16: 100 mg via INTRAVENOUS

## 2011-12-16 MED ORDER — MIDAZOLAM HCL 2 MG/2ML IJ SOLN: 1.0000 mg | INTRAMUSCULAR | Status: DC | PRN

## 2011-12-16 MED ORDER — SODIUM CHLORIDE 0.9 % IR SOLN
Status: DC | PRN
  Administered 2011-12-16: 1000 mL

## 2011-12-16 MED ORDER — PHENYLEPHRINE HCL 10 MG/ML IJ SOLN
INTRAMUSCULAR | Status: DC | PRN
  Administered 2011-12-16 (×3): 80 ug via INTRAVENOUS
  Administered 2011-12-16: 40 ug via INTRAVENOUS
  Administered 2011-12-16: 80 ug via INTRAVENOUS
  Administered 2011-12-16: 40 ug via INTRAVENOUS
  Administered 2011-12-16: 80 ug via INTRAVENOUS

## 2011-12-16 MED ORDER — MIDAZOLAM HCL 5 MG/5ML IJ SOLN
INTRAMUSCULAR | Status: DC | PRN
  Administered 2011-12-16: 1 mg via INTRAVENOUS

## 2011-12-16 MED ORDER — HYDROMORPHONE HCL PF 1 MG/ML IJ SOLN
0.2500 mg | INTRAMUSCULAR | Status: DC | PRN
  Administered 2011-12-16 (×2): 0.5 mg via INTRAVENOUS

## 2011-12-16 MED ORDER — TAMSULOSIN HCL 0.4 MG PO CAPS
0.4000 mg | ORAL_CAPSULE | Freq: Every day | ORAL | Status: DC
  Administered 2011-12-16 – 2011-12-17 (×2): 0.4 mg via ORAL
  Filled 2011-12-16 (×3): qty 1

## 2011-12-16 MED ORDER — BACITRACIN ZINC 500 UNIT/GM EX OINT
TOPICAL_OINTMENT | CUTANEOUS | Status: DC | PRN
  Administered 2011-12-16: 1 via TOPICAL

## 2011-12-16 MED ORDER — OXYCODONE-ACETAMINOPHEN 5-325 MG PO TABS: 1.0000 | ORAL_TABLET | Freq: Three times a day (TID) | ORAL | Status: DC | PRN

## 2011-12-16 MED ORDER — FENTANYL CITRATE 0.05 MG/ML IJ SOLN: 50.0000 ug | INTRAMUSCULAR | Status: DC | PRN

## 2011-12-16 SURGICAL SUPPLY — 40 items
ATTRACTOMAT 16X20 MAGNETIC DRP (DRAPES) IMPLANT
BLADE RAD40 ROTATE 4M 4 5PK (BLADE) IMPLANT
BLADE RAD60 ROTATE M4 4 5PK (BLADE) IMPLANT
BLADE TRICUT ROTATE M4 4 5PK (BLADE) IMPLANT
CANISTER SUCTION 2500CC (MISCELLANEOUS) ×1 IMPLANT
CLEANER TIP ELECTROSURG 2X2 (MISCELLANEOUS) IMPLANT
CLOTH BEACON ORANGE TIMEOUT ST (SAFETY) ×2 IMPLANT
COAGULATOR SUCT 8FR VV (MISCELLANEOUS) ×1 IMPLANT
DRESSING NASAL KENNEDY 3.5X.9 (MISCELLANEOUS) IMPLANT
DRESSING NASAL POPE 10X1.5X2.5 (GAUZE/BANDAGES/DRESSINGS) IMPLANT
DRESSING TELFA 8X3 (GAUZE/BANDAGES/DRESSINGS) IMPLANT
DRSG NASAL KENNEDY 3.5X.9 (MISCELLANEOUS)
DRSG NASAL POPE 10X1.5X2.5 (GAUZE/BANDAGES/DRESSINGS) ×2
ELECT COATED BLADE 2.86 ST (ELECTRODE) IMPLANT
ELECT REM PT RETURN 9FT ADLT (ELECTROSURGICAL)
ELECTRODE REM PT RTRN 9FT ADLT (ELECTROSURGICAL) IMPLANT
FILTER ARTHROSCOPY CONVERTOR (FILTER) IMPLANT
GLOVE SS BIOGEL STRL SZ 7.5 (GLOVE) ×2 IMPLANT
GLOVE SUPERSENSE BIOGEL SZ 7.5 (GLOVE) ×2
GOWN PREVENTION PLUS XLARGE (GOWN DISPOSABLE) ×2 IMPLANT
GOWN STRL NON-REIN LRG LVL3 (GOWN DISPOSABLE) ×2 IMPLANT
HEMOSTAT SURGICEL 2X14 (HEMOSTASIS) ×1 IMPLANT
KIT BASIN OR (CUSTOM PROCEDURE TRAY) ×2 IMPLANT
KIT ROOM TURNOVER OR (KITS) ×2 IMPLANT
MARKER SPHERE PSV REFLC NDI (MISCELLANEOUS) ×5 IMPLANT
NDL SPNL 25GX3.5 QUINCKE BL (NEEDLE) IMPLANT
NEEDLE 27GAX1X1/2 (NEEDLE) ×1 IMPLANT
NEEDLE SPNL 25GX3.5 QUINCKE BL (NEEDLE) IMPLANT
NS IRRIG 1000ML POUR BTL (IV SOLUTION) ×2 IMPLANT
PAD ARMBOARD 7.5X6 YLW CONV (MISCELLANEOUS) ×4 IMPLANT
PATTIES SURGICAL .5 X3 (DISPOSABLE) ×2 IMPLANT
PENCIL BUTTON HOLSTER BLD 10FT (ELECTRODE) IMPLANT
SOLUTION ANTI FOG 6CC (MISCELLANEOUS) ×2 IMPLANT
SPECIMEN JAR SMALL (MISCELLANEOUS) ×2 IMPLANT
SPLINT NASAL DOYLE BI-VL (GAUZE/BANDAGES/DRESSINGS) IMPLANT
TOWEL OR 17X24 6PK STRL BLUE (TOWEL DISPOSABLE) ×2 IMPLANT
TOWEL OR 17X26 10 PK STRL BLUE (TOWEL DISPOSABLE) ×2 IMPLANT
TRAY ENT MC OR (CUSTOM PROCEDURE TRAY) ×2 IMPLANT
TUBE CONNECTING 12X1/4 (SUCTIONS) ×2 IMPLANT
WATER STERILE IRR 1000ML POUR (IV SOLUTION) ×2 IMPLANT

## 2011-12-16 NOTE — Anesthesia Preprocedure Evaluation (Signed)
Anesthesia Evaluation  Patient identified by MRN, date of birth, ID band Patient awake    Reviewed: Allergy & Precautions, H&P , NPO status , Patient's Chart, lab work & pertinent test results  Airway Mallampati: II TM Distance: >3 FB Neck ROM: Full    Dental  (+) Teeth Intact and Dental Advisory Given   Pulmonary  breath sounds clear to auscultation        Cardiovascular hypertension, Pt. on medications Rhythm:Regular Rate:Normal     Neuro/Psych    GI/Hepatic   Endo/Other  Diabetes mellitus-, Well Controlled, Type 2, Oral Hypoglycemic Agents  Renal/GU      Musculoskeletal   Abdominal   Peds  Hematology   Anesthesia Other Findings   Reproductive/Obstetrics                           Anesthesia Physical Anesthesia Plan  ASA: III  Anesthesia Plan: General   Post-op Pain Management:    Induction: Intravenous  Airway Management Planned: Oral ETT  Additional Equipment:   Intra-op Plan:   Post-operative Plan: Extubation in OR  Informed Consent: I have reviewed the patients History and Physical, chart, labs and discussed the procedure including the risks, benefits and alternatives for the proposed anesthesia with the patient or authorized representative who has indicated his/her understanding and acceptance.   Dental advisory given  Plan Discussed with: CRNA, Anesthesiologist and Surgeon  Anesthesia Plan Comments:         Anesthesia Quick Evaluation

## 2011-12-16 NOTE — Op Note (Signed)
NAMEALDRED, MASE                    ACCOUNT NO.:  [id number]  MEDICAL RECORD NO.:  [id number]  LOCATION:  [facility]                         FACILITY:  [facility]  PHYSICIAN:  Kristine Garbe. Ezzard Standing, M.D.DATE OF BIRTH:  06-05-1941  DATE OF PROCEDURE:  12/16/2011 DATE OF DISCHARGE:                              OPERATIVE REPORT   PREOPERATIVE DIAGNOSIS:  Chronic epistaxis.  POSTOPERATIVE DIAGNOSIS:  Chronic epistaxis.  OPERATION:  Endoscopic cauterization of bilateral epistaxis.  SURGEON:  Kristine Garbe. Ezzard Standing, MD  ANESTHESIA:  General endotracheal.  COMPLICATIONS:  None.  BRIEF CLINICAL NOTE:  Robert Irwin is a 71 year old gentleman who has chronic atrial fibrillation and has been on Coumadin.  He had been seen in my office twice earlier this week with nasal bleeds.  On the second attempt on Wednesday, he continued to have some bleeding requiring cauterization as well as packing and he was sent to check his INR being on Coumadin.  At that time, his INR was 6.8.  He continued to have the bleeding, became tachycardic, and was subsequently admitted through the emergency room to the hospital.  His hemoglobin dropped from 10 down to 6.8.  He received FFP as well as 2 units of packed red blood cells.  The bleeding eventually stopped, but still has packing in both nostrils.  He is taken to the operating room at this time for removal of the nasal packing, cauterization of any further bleeding.  His INR is now down to 1.4, and his last hemoglobin this morning was 9.6.  DESCRIPTION OF PROCEDURE:  After adequate endotracheal anesthesia, the left nasal packing was removed first.  He had a little bit of granulation tissue, and a little bit of small amount of oozing from the anterior-inferior left septum which had been previously cauterized as an outpatient in the office with silver nitrate.  Using suction cautery, this area was re-cauterized until there was no bleeding.  He had some blood clot for the  posterior portion of the nasal cavity which was suctioned removed.  On endoscopic exam, the rest of the nasal cavity was clear with no active bleeding noted.  Next, the right side was approached in a similar fashion.  The anterior packing was removed.  The previous area of cauterization was cauterized again with suction cautery.  He had a large amount of a blood clot in the nasopharynx that was suctioned and removed.  There was no further active bleeding noted in the posterior nasal cavity.  After obtaining adequate hemostasis with suction cautery, two 2 pieces of Surgicel were placed over the previously cauterized septal areas soaked in bacitracin ointment, followed by placement of Merocel short packing placed in the anterior nasal cavity bilaterally.  There was no further bleeding.  Nasopharynx and oropharynx was examined and was clear.  The patient was subsequently awoken from anesthesia and transferred to the recovery room and back to the floor postoperatively.  We will keep the patient on antibiotic Keflex 500 mg b.i.d. for a week and have him followup in my office in 4 days to have his nasal packs removed.          ______________________________ Kristine Garbe Ezzard Standing, M.D.  CEN/MEDQ  D:  12/16/2011  T:  12/16/2011  Job:  [id number]

## 2011-12-16 NOTE — Preoperative (Signed)
Beta Blockers   Reason not to administer Beta Blockers:Not Applicable 

## 2011-12-16 NOTE — Progress Notes (Signed)
Patient s/p nasal cauterization. No further bleeding noted. Nasal packs in place. Ready for discharge. Meds per medical MD and Keflex for 5 days. Will followup my office next Tues.

## 2011-12-16 NOTE — Consult Note (Signed)
Reason for Consult:Post op retention Referring Physician: Illene Regulus MD  Robert Irwin is an 71 y.o. male.  HPI: [name] was taken to the OR today for epistaxis.   He had been on warfarin.  He has been unable to void in the post op period and a bladder scan showed a PVR of 825cc.  Nursing staff attempted foley placement without success and I was consulted.   Mr. Caughlin denies prior voiding difficulty or GU history.  Past Medical History  Diagnosis Date  . A-fib   . Hyperlipidemia   . Diabetes mellitus   . Hypertension   . Epistaxis   . Atrial fibrillation     Past Surgical History  Procedure Date  . Nasal mass excision     History reviewed. No pertinent family history.  Social History:  reports that he has never smoked. He does not have any smokeless tobacco history on file. He reports that he drinks alcohol. He reports that he does not use illicit drugs.  Allergies: No Known Allergies  Medications:  I have reviewed the patient's current medications. Prior to Admission:  Prescriptions prior to admission  Medication Sig Dispense Refill  . atorvastatin (LIPITOR) 20 MG tablet Take 20 mg by mouth daily.      . calcium citrate-vitamin D 200-200 MG-UNIT TABS Take 1 tablet by mouth daily.      Marland Kitchen diltiazem (TIAZAC) 240 MG 24 hr capsule Take 240 mg by mouth daily.      . fish oil-omega-3 fatty acids 1000 MG capsule Take 2 g by mouth daily.      . fluocinonide ointment (LIDEX) 0.05 % Apply 1 application topically 2 (two) times daily.      Marland Kitchen glimepiride (AMARYL) 2 MG tablet Take 1 mg by mouth daily before breakfast.      . GLUCOSAMINE PO Take 1 tablet by mouth daily. Hold while in hospital      . lisinopril (PRINIVIL,ZESTRIL) 2.5 MG tablet Take 2.5 mg by mouth daily.      . Multiple Vitamin (MULITIVITAMIN WITH MINERALS) TABS Take 1 tablet by mouth daily.      Marland Kitchen DISCONTD: aspirin 81 MG tablet Take 81 mg by mouth daily. Take three times a week.      Marland Kitchen DISCONTD: phenylephrine (NEO-SYNEPHRINE)  0.5 % nasal solution Place 1 drop into the nose every 4 (four) hours as needed.      Marland Kitchen DISCONTD: warfarin (COUMADIN) 2.5 MG tablet Take 2.5 mg by mouth daily.        Results for orders placed during the hospital encounter of 12/14/11 (from the past 48 hour(s))  BASIC METABOLIC PANEL     Status: Abnormal   Collection Time   12/15/11  5:00 AM      Component Value Range Comment   Sodium 143  135 - 145 (mEq/L)    Potassium 4.3  3.5 - 5.1 (mEq/L)    Chloride 107  96 - 112 (mEq/L)    CO2 29  19 - 32 (mEq/L)    Glucose, Bld 113 (*) 70 - 99 (mg/dL)    BUN 15  6 - 23 (mg/dL)    Creatinine, Ser 1.61  0.50 - 1.35 (mg/dL)    Calcium 8.8  8.4 - 10.5 (mg/dL)    GFR calc non Af Amer >90  >90 (mL/min)    GFR calc Af Amer >90  >90 (mL/min)   CBC     Status: Abnormal   Collection Time   12/15/11  5:00 AM      Component Value Range Comment   WBC 6.1  4.0 - 10.5 (K/uL)    RBC 1.94 (*) 4.22 - 5.81 (MIL/uL)    Hemoglobin 6.6 (*) 13.0 - 17.0 (g/dL)    HCT 81.1 (*) 91.4 - 52.0 (%)    MCV 100.5 (*) 78.0 - 100.0 (fL)    MCH 34.0  26.0 - 34.0 (pg)    MCHC 33.8  30.0 - 36.0 (g/dL)    RDW 78.2  95.6 - 21.3 (%)    Platelets 148 (*) 150 - 400 (K/uL)   PROTIME-INR     Status: Abnormal   Collection Time   12/15/11  5:00 AM      Component Value Range Comment   Prothrombin Time 18.5 (*) 11.6 - 15.2 (seconds)    INR 1.51 (*) 0.00 - 1.49    PREPARE RBC (CROSSMATCH)     Status: Normal   Collection Time   12/15/11  6:30 AM      Component Value Range Comment   Order Confirmation ORDER PROCESSED BY BLOOD BANK     GLUCOSE, CAPILLARY     Status: Abnormal   Collection Time   12/15/11  7:23 AM      Component Value Range Comment   Glucose-Capillary 112 (*) 70 - 99 (mg/dL)    Comment 1 Documented in Chart      Comment 2 Notify RN     GLUCOSE, CAPILLARY     Status: Normal   Collection Time   12/15/11 11:23 AM      Component Value Range Comment   Glucose-Capillary 99  70 - 99 (mg/dL)    Comment 1 Documented in Chart       Comment 2 Notify RN     CBC     Status: Abnormal   Collection Time   12/15/11  3:41 PM      Component Value Range Comment   WBC 6.3  4.0 - 10.5 (K/uL)    RBC 2.69 (*) 4.22 - 5.81 (MIL/uL)    Hemoglobin 8.7 (*) 13.0 - 17.0 (g/dL) POST TRANSFUSION SPECIMEN   HCT 25.3 (*) 39.0 - 52.0 (%)    MCV 94.1  78.0 - 100.0 (fL)    MCH 32.3  26.0 - 34.0 (pg)    MCHC 34.4  30.0 - 36.0 (g/dL)    RDW 08.6 (*) 57.8 - 15.5 (%)    Platelets 134 (*) 150 - 400 (K/uL)   DIFFERENTIAL     Status: Normal   Collection Time   12/15/11  3:41 PM      Component Value Range Comment   Neutrophils Relative 67  43 - 77 (%)    Neutro Abs 4.2  1.7 - 7.7 (K/uL)    Lymphocytes Relative 22  12 - 46 (%)    Lymphs Abs 1.4  0.7 - 4.0 (K/uL)    Monocytes Relative 8  3 - 12 (%)    Monocytes Absolute 0.5  0.1 - 1.0 (K/uL)    Eosinophils Relative 2  0 - 5 (%)    Eosinophils Absolute 0.1  0.0 - 0.7 (K/uL)    Basophils Relative 0  0 - 1 (%)    Basophils Absolute 0.0  0.0 - 0.1 (K/uL)   GLUCOSE, CAPILLARY     Status: Normal   Collection Time   12/15/11  4:22 PM      Component Value Range Comment   Glucose-Capillary 91  70 - 99 (mg/dL)  Comment 1 Documented in Chart      Comment 2 Notify RN     GLUCOSE, CAPILLARY     Status: Abnormal   Collection Time   12/15/11  9:25 PM      Component Value Range Comment   Glucose-Capillary 130 (*) 70 - 99 (mg/dL)   CBC     Status: Abnormal   Collection Time   12/16/11  5:55 AM      Component Value Range Comment   WBC 6.3  4.0 - 10.5 (K/uL)    RBC 2.77 (*) 4.22 - 5.81 (MIL/uL)    Hemoglobin 9.2 (*) 13.0 - 17.0 (g/dL)    HCT 45.4 (*) 09.8 - 52.0 (%)    MCV 95.7  78.0 - 100.0 (fL)    MCH 33.2  26.0 - 34.0 (pg)    MCHC 34.7  30.0 - 36.0 (g/dL)    RDW 11.9 (*) 14.7 - 15.5 (%)    Platelets 156  150 - 400 (K/uL)   GLUCOSE, CAPILLARY     Status: Abnormal   Collection Time   12/16/11  8:00 AM      Component Value Range Comment   Glucose-Capillary 122 (*) 70 - 99 (mg/dL)    Comment  1 Documented in Chart      Comment 2 Notify RN     PROTIME-INR     Status: Abnormal   Collection Time   12/16/11  8:18 AM      Component Value Range Comment   Prothrombin Time 16.7 (*) 11.6 - 15.2 (seconds)    INR 1.33  0.00 - 1.49    GLUCOSE, CAPILLARY     Status: Abnormal   Collection Time   12/16/11 10:24 AM      Component Value Range Comment   Glucose-Capillary 119 (*) 70 - 99 (mg/dL)   GLUCOSE, CAPILLARY     Status: Abnormal   Collection Time   12/16/11 11:48 AM      Component Value Range Comment   Glucose-Capillary 135 (*) 70 - 99 (mg/dL)   GLUCOSE, CAPILLARY     Status: Abnormal   Collection Time   12/16/11  4:54 PM      Component Value Range Comment   Glucose-Capillary 151 (*) 70 - 99 (mg/dL)   GLUCOSE, CAPILLARY     Status: Abnormal   Collection Time   12/16/11 10:27 PM      Component Value Range Comment   Glucose-Capillary 130 (*) 70 - 99 (mg/dL)     No results found.  Review of Systems  Gastrointestinal: Positive for abdominal pain.  Genitourinary: Positive for urgency.  All other systems reviewed and are negative.   Blood pressure 114/60, pulse 90, temperature 98.8 F (37.1 C), temperature source Oral, resp. rate 17, height 5\' 7"  (1.702 m), weight 74.526 kg (164 lb 4.8 oz), SpO2 93.00%. Physical Exam  Constitutional: He is oriented to person, place, and time. He appears well-developed and well-nourished.  GI: He exhibits mass (in the suprapubic area consistent with a distended bladder).  Genitourinary: Penis normal.       Nl scrotum and testes.  Neurological: He is alert and oriented to person, place, and time.  Skin: Skin is warm and dry.  Psychiatric: He has a normal mood and affect.    Assessment/Plan: Post op urinary retention with no prior GU history.  I placed a 60fr coude foley catheter without difficulty after a betadine prep and intraurethral lubricant was instilled.Marland Kitchen He will be started on flomax tonight  and can have the foley removed for a  voiding trial prior to discharge. He will need to be voiding with a PVR confirmed to be <200cc to be able to go home without a foley.  Abigial Newville J 12/16/2011, 11:41 PM

## 2011-12-16 NOTE — Anesthesia Procedure Notes (Addendum)
Performed by: Marena Chancy   Procedure Name: Intubation Date/Time: 12/16/2011 8:58 AM Performed by: Marena Chancy Pre-anesthesia Checklist: Patient identified, Emergency Drugs available, Suction available, Patient being monitored and Timeout performed Patient Re-evaluated:Patient Re-evaluated prior to inductionOxygen Delivery Method: Circle system utilized Preoxygenation: Pre-oxygenation with 100% oxygen Intubation Type: IV induction Ventilation: Mask ventilation without difficulty and Oral airway inserted - appropriate to patient size Laryngoscope Size: Hyacinth Meeker and 2 Grade View: Grade IV Tube size: 7.5 mm Number of attempts: 2 Airway Equipment and Method: Video-laryngoscopy Placement Confirmation: breath sounds checked- equal and bilateral and positive ETCO2 Secured at: 23 cm Tube secured with: Tape Dental Injury: Teeth and Oropharynx as per pre-operative assessment  Difficulty Due To: Difficulty was unanticipated, Difficult Airway- due to reduced neck mobility and Difficult Airway- due to anterior larynx

## 2011-12-16 NOTE — Brief Op Note (Signed)
12/14/2011 - 12/16/2011  9:49 AM  PATIENT:  Robert Irwin  71 y.o. male  PRE-OPERATIVE DIAGNOSIS:  Epistasis  POST-OPERATIVE DIAGNOSIS:  Epistasis  PROCEDURE:  Procedure(s) (LRB): ENDOSCOPIC CAUTERIZATION OF BILATERAL EPSTAXIS  SURGEON:  Surgeon(s) and Role:    * Drema Halon, MD - Primary  PHYSICIAN ASSISTANT:   ASSISTANTS: none   ANESTHESIA:   general  EBL:  Total I/O In: 1000 [I.V.:1000] Out: -   BLOOD ADMINISTERED:none  DRAINS: none   LOCAL MEDICATIONS USED:  NONE  SPECIMEN:  No Specimen  DISPOSITION OF SPECIMEN:  N/A  COUNTS:  YES  TOURNIQUET:  * No tourniquets in log *  DICTATION: .Other Dictation: Dictation Number [id number]  PLAN OF CARE: Admit to inpatient   PATIENT DISPOSITION:  PACU - hemodynamically stable.   Delay start of Pharmacological VTE agent (>24hrs) due to surgical blood loss or risk of bleeding: not applicable

## 2011-12-16 NOTE — Discharge Instructions (Signed)
Return to see Dr. Ezzard Standing next Tuesday at 4:30 to have nasal packs removed Keflex 500 mg BID for 5 days to decrease risk of infection

## 2011-12-16 NOTE — Progress Notes (Signed)
Patient ID: Robert Irwin, male   DOB: [birth year], 71 y.o.   MRN: [id number] Subjective: C/o nasal pain with packing Or today by ENT No more bleeding HGB 9.2 BP ok  Objective: Vital signs in last 24 hours: Temp:  [97.7 F (36.5 C)-98.5 F (36.9 C)] 98.2 F (36.8 C) (05/24 0558) Pulse Rate:  [78-102] 89  (05/24 0558) Resp:  [16-20] 16  (05/24 0558) BP: (91-124)/(51-75) 114/67 mmHg (05/24 0558) SpO2:  [96 %-99 %] 97 % (05/24 0558) Weight:  [69.219 kg (152 lb 9.6 oz)] 69.219 kg (152 lb 9.6 oz) (05/23 2130)   Intake/Output from previous day: 05/23 0701 - 05/24 0700 In: 2880 [P.O.:240; I.V.:2640] Out: -     General appearance: alert Nose: Nares normal. Septum midline. Mucosa normal. No drainage or sinus tenderness., packing in place Resp: clear to auscultation bilaterally Cardio: irregularly irregular rhythm  Lab Results:  Basename 12/16/11 0555 12/15/11 1541  WBC 6.3 6.3  HGB 9.2* 8.7*  HCT 26.5* 25.3*  PLT 156 134*   BMET  Basename 12/15/11 0500 12/14/11 1816 12/14/11 1719  NA 143 139 --  K 4.3 4.7 --  CL 107 106 --  CO2 29 -- 24  GLUCOSE 113* 155* --  BUN 15 30* --  CREATININE 0.68 0.90 --  CALCIUM 8.8 -- 8.4   Lab Results  Component Value Date   ALT 14 12/14/2011   AST 23 12/14/2011   ALKPHOS 46 12/14/2011   BILITOT 1.1 12/14/2011    Assessment/Plan:  Principal Problem:  *Epistaxis ENT - for OR to open cautery No more bleeding Severe Anemia- with blood loss- S/p Transfusion HBG 9.2  super thuereupetic INR-   Check PT/INR- last 1.5 No more coumadin Discussed with Card- possible Xarelto outpt Active Problems:  Hyperlipidemia  Diabetes mellitus amaryl po  Hypertension BP ok  Atrial fibrillation with RVR diltazem - rate control  Anemia Plan to watch in hosp today after procedure- If CBC stable  D/c home With his Diltazem, and amaryl; And  Hold lisinopril NO COUMADIN or ASA F/U ON Tuesday WITH ME office.   Renea Schoonmaker 12/16/2011, 7:25  AM

## 2011-12-16 NOTE — Progress Notes (Signed)
Status post cauterization this AM. No further bleeding noted. Packing removed from L nostril. Will remove packing from R side next week

## 2011-12-16 NOTE — Progress Notes (Signed)
Called approx 22:58 by RN - patient with bladder distention, 800+cc by bladder scan. Patient is reported to be uncomfortable and only able to micturate small volumes.  RN called back: several attempts at I&O cath failed due to resistance. Flomax has been ordered but first dose pending.  Called Dr. Annabell Howells, on-cal urologist. He will come see the patient and assist with catheter placement.

## 2011-12-16 NOTE — Anesthesia Postprocedure Evaluation (Signed)
  Anesthesia Post-op Note  Patient: Robert Irwin  Procedure(s) Performed: Procedure(s) (LRB): ENDOSCOPIC SINUS SURGERY (N/A)  Patient Location: PACU  Anesthesia Type: General  Level of Consciousness: awake, alert  and oriented  Airway and Oxygen Therapy: Patient Spontanous Breathing and Patient connected to face mask oxygen  Post-op Pain: mild  Post-op Assessment: Post-op Vital signs reviewed  Post-op Vital Signs: Reviewed  Complications: No apparent anesthesia complications

## 2011-12-16 NOTE — Transfer of Care (Signed)
Immediate Anesthesia Transfer of Care Note  Patient: Robert Irwin  Procedure(s) Performed: Procedure(s) (LRB): ENDOSCOPIC SINUS SURGERY (N/A)  Patient Location: PACU  Anesthesia Type: General  Level of Consciousness: awake, alert  and oriented  Airway & Oxygen Therapy: Patient Spontanous Breathing and Patient connected to face mask oxygen  Post-op Assessment: Report given to PACU RN, Post -op Vital signs reviewed and stable and Patient moving all extremities X 4  Post vital signs: Reviewed and stable  Complications: No apparent anesthesia complications

## 2011-12-17 DIAGNOSIS — R040 Epistaxis: Secondary | ICD-10-CM

## 2011-12-17 LAB — GLUCOSE, CAPILLARY
Glucose-Capillary: 124 mg/dL — ABNORMAL HIGH (ref 70–99)
Glucose-Capillary: 107 mg/dL — ABNORMAL HIGH (ref 70–99)
Glucose-Capillary: 174 mg/dL — ABNORMAL HIGH (ref 70–99)

## 2011-12-17 LAB — CBC
Hemoglobin: 9.3 g/dL — ABNORMAL LOW (ref 13.0–17.0)
MCHC: 33.9 g/dL (ref 30.0–36.0)
RDW: 17.2 % — ABNORMAL HIGH (ref 11.5–15.5)

## 2011-12-17 LAB — PROTIME-INR
INR: 1.22 (ref 0.00–1.49)
Prothrombin Time: 15.7 seconds — ABNORMAL HIGH (ref 11.6–15.2)

## 2011-12-17 NOTE — Progress Notes (Signed)
Caude catheter removed at 0656 without any difficulty.  Emptied 175 clear yellow urine.  Discussed with patient the need to drink water and to void at least every two hours.  He is also to call us to room after his first void and to urinate in the urinal.  He voiced understanding.  Elan Mcelvain, Marta Lamas

## 2011-12-17 NOTE — Progress Notes (Signed)
Subjective: Mr. Convey was admitted with refractory high volume epistaxis that required operative intervention. Bleeding has stopped. He did require transfusion to bring Hgb to 9.2.  He had episode of urinary retention last night requring coude catheter placement. This was removed this AM.  Objective: Lab: Lab Results  Component Value Date   WBC 5.6 12/17/2011   HGB 9.3* 12/17/2011   HCT 27.4* 12/17/2011   MCV 95.8 12/17/2011   PLT 167 12/17/2011   BMET    Component Value Date/Time   NA 143 12/15/2011 0500   K 4.3 12/15/2011 0500   CL 107 12/15/2011 0500   CO2 29 12/15/2011 0500   GLUCOSE 113* 12/15/2011 0500   BUN 15 12/15/2011 0500   CREATININE 0.68 12/15/2011 0500   CALCIUM 8.8 12/15/2011 0500   GFRNONAA >90 12/15/2011 0500   GFRAA >90 12/15/2011 0500     Imaging: No imaging  Physical Exam: Filed Vitals:   12/17/11 0900  BP: 99/61  Pulse: 86  Temp: 97.9 F (36.6 C)  Resp: 18    Intake/Output Summary (Last 24 hours) at 12/17/11 0949 Last data filed at 12/17/11 0900  Gross per 24 hour  Intake   1020 ml  Output   2525 ml  Net  -1505 ml   CBG (last 3)   Basename 12/17/11 0737 12/16/11 2227 12/16/11 1654  GLUCAP 124* 130* 151*     Gen'l - WNWD korean man in no distress HEENT- normal Cor - 2+ radial, IRIR Pulm - normal respirations Abd - soft, no suprapubic tenderness      Assessment/Plan: 1. Epistaxis - no bleeding. Patient with some soreness and pressure in right nostril. Hgb stable  2. GU- acute urinary retention relieved with catheter - now removed. He has voided once today. Plan - observe today for any recurrent retention  Continue flomax  Home in AM if able to void  3. A fib -  Rate controlled. Plan -  Home on present medications  Continue to hold anti-coagulation: patient and family understand reason    For holding medication.  4. DM - stable   Illene Regulus 12/17/2011, 9:39 AM

## 2011-12-17 NOTE — Plan of Care (Signed)
Problem: Phase I Progression Outcomes Goal: Voiding-avoid urinary catheter unless indicated Outcome: Not Progressing Patient only able to urinate small amounts on the night of 12/16/2011.  Bladder scan showed greater than 820.  Bladder is distended and patient has feeling of urgency.  I&O catheter attempted per MD orders.  Unable to complete secondary to resistance.  MD called in Urologist who placed 16 [place].

## 2011-12-17 NOTE — Progress Notes (Signed)
Patient ID: Robert Irwin, male   DOB: 1941-06-17, 71 y.o.   MRN: [id number]  [name] had postop retention and a foley was placed last night.  He was started on flomax.  The foley was removed this morning and he has been able to void.  ROS: negative for urgency or abdominal pain.  PE: Afeb VSS. Gen: WD, WN in NAD. GI: bladder not palpable.  Imp: Post op retention has resolved.  Plan:  Would continue the flomax for about 2 weeks post d/c. F/U with urology prn.

## 2011-12-18 LAB — GLUCOSE, CAPILLARY
Glucose-Capillary: 132 mg/dL — ABNORMAL HIGH (ref 70–99)
Glucose-Capillary: 138 mg/dL — ABNORMAL HIGH (ref 70–99)

## 2011-12-18 MED ORDER — TAMSULOSIN HCL 0.4 MG PO CAPS: 0.4000 mg | ORAL_CAPSULE | Freq: Every day | ORAL | Status: DC

## 2011-12-18 NOTE — Progress Notes (Signed)
Patient with mild drop in BP - held diltiazem yesterday. At discharge BP ok but he is having some RVr. He will resume home meds.  He is medically stable and ready for d/c  Dcitation # [phone]

## 2011-12-19 NOTE — Discharge Summary (Signed)
NAMEAPOLINAR, Robert Irwin                    ACCOUNT NO.:  [id number]  MEDICAL RECORD NO.:  [id number]  LOCATION:  6734                         FACILITY:  [facility]  PHYSICIAN:  Rosalyn Gess. Maezie Justin, MD  DATE OF BIRTH:  08/21/1940  DATE OF ADMISSION:  12/14/2011 DATE OF DISCHARGE:  12/18/2011                              DISCHARGE SUMMARY   ADMITTING DIAGNOSES: 1. Refractory epistaxis. 2. Anemia. 3. Atrial fibrillation with rapid ventricular response. 4. Coagulopathy.  DISCHARGE DIAGNOSES: 1. Refractory epistaxis. 2. Anemia. 3. Atrial fibrillation with rapid ventricular response. 4. Coagulopathy.  CONSULTANTS:  Dr. Dillard Cannon.  PROCEDURES:  Surgical cauterization of his right naris for refractory bleeding by Dr. Ezzard Standing.  HISTORY OF PRESENT ILLNESS:  Robert Irwin is a 71 year old [place] gentleman who has been having trouble with epistaxis.  He had been seen as an outpatient by Dr. Narda Bonds and had an office cauterization.  He did have continued profuse bleeding.  The patient was seen by cardiology as an outpatient and was found to be hypercoagulated with an INR of 6.8. He was given vitamin K by mouth in the cardiologist's office and sent to the emergency department.  At evaluation, he was tachycardic to the 160s.  He was lightheaded and dizzy, he had nausea and vomiting of old blood, he had black stools.  Because of his hypercoagulable state and refractory bleeding, he was admitted to the hospital.  Please see the H and P and other previous epic notes for past medical history, family history, social history, and medications.  HOSPITAL COURSE: 1. Refractory epistaxis.  Patient was seen by Dr. Ezzard Standing in the     hospital.  He was taken to the OR for removal of packing nasal     examination and cauterization on May 24.  This was uneventful and     the patient did well.  At time of discharge dictation, the patient     still has packing in the right naris.  He is to see Dr. Ezzard Standing on  Tuesday, May 28, for followup. 2. Anemia.  The patient had significant blood loss.  His hemoglobin     dropped to 6.6 g.  He was transfused 2 units of packed red cells.     With this, his hemoglobin came up to 9.2 and remained stable.  Iron     studies were done and were normal with a low iron saturation.  The     patient has no prior history of anemia and it was felt that his     anemia was strictly related to blood loss.  Plan: Patient is     instructed to have an iron rich diet.  He will need to have a     followup H and H by his primary care physician in 7-10 days. 3. Hypercoagulable state.  The patient was taken off Coumadin.  He was     given vitamin K and fresh frozen plasma.  At time of discharge, the     patient was stable.  Final INR Dec 17, 2011, was 1.22.  Plan:     Patient is to remain off the Coumadin.  When the  patient is deemed     to be stable with no evidence of further bleeding or blood loss, he     will discuss resuming anticoagulation therapy with Dr. Donette Larry.  He     may be a candidate for Xarelto therapy. 4. Atrial fibrillation.  The patient did remain in atrial fibrillation     but was hemodynamically stable with a good blood pressure.  His     heart rate was not rapid.  Plan is for him to continue his home     medications.  He is to follow with Dr. Anne Fu, Cardiology, as     instructed. 5. Hypertension.  Patient's blood pressure did remain stable. 6. Diabetes.  The patient's blood sugars were monitored, and he had     sliding scale during his hospitalization.  He did remain stable.     A1c was not done this admission.  Final CBG noted in the chart was on May 23 and was stable at 113.  On day of     discharge, CBG in the morning was 132.  Plan: The patient will     continue his home medications without change. 7. Dyslipidemia.  This is to be followed as an outpatient. 8. GU:  The patient did have an episode of acute urinary retention on     the night of May 24.  He  was seen in consultation by Dr. Benard Rink     who placed a Coude catheter and relieved his retention with 800 mL     of urine free-flowing after placement of catheter.  Catheter was     discontinued at 06:38 hours on the a.m. of May 25.  The patient has     also been started on Flomax 0.4 mg once daily.  The patient was     able to micturate normally with no recurrent urinary retention.     Plan: Patient is to continue on Flomax for at least 1 month.     Continuation will be determined by Dr. Donette Larry.  With the patient's epistaxis being stable with his atrial fibrillation stable with his hypercoagulable state being corrected with his blood pressure being stable with his heart rate being well controlled with his urinary retention, resolved at this point he is ready for discharge to home.  DISCHARGE EXAMINATION:  VITAL SIGNS: Temperature 97.8, blood pressure 111/65, heart rate 111, respirations 18, O2 sats 95% on room air. GENERAL APPEARANCE: A pleasant [place] gentleman sitting on the side of the bed, in no distress. HEENT: Conjunctivae and sclerae were clear. NECK: Supple. CHEST: Patient is moving air well with no rales, wheezes, or rhonchi. CARDIOVASCULAR: 2+ radial pulses.  Precordium was quiet.  He had an irregularly irregular heart beat that was minimally tachycardic. NEURO: Patient is awake, alert.  He is oriented to person, place, time and context.  FINAL LABORATORY:  Hemoglobin 9.5 g, hematocrit 27.9%, platelet count from May 25 was 167.  Final chemistries from May 23, with a sodium of 143, potassium 4.3, chloride 107, CO2 of 29, BUN of 15, creatinine 0.68, glucose was 113.  Iron panel from May 22, with an iron of 38, TIBC 249, iron saturation was 15% and low, folate was 17.5, B12 was 506.  No imaging studies are available.  DISPOSITION:  Patient was discharged to home.  He is to see Dr. Ezzard Standing on Tuesday, the 28th.  The patient is to see Dr. Anne Fu as instructed. Patient is  to call the office, to schedule  appointment with Dr. Donette Larry in 7-10 days, at which point, he will need a followup H and H and is to discuss resuming anticoagulation therapy.  Patient is advised to follow an iron-rich diet  with no other special precautions.  He will resume all his home medications including his Cardizem.  Patient is to call for any problems with lightheadedness, dizziness, increasing pain, or discomfort.     Rosalyn Gess Willisha Sligar, MD     MEN/MEDQ  D:  12/18/2011  T:  12/18/2011  Job:  [id number]  cc:   Jake Bathe, MD

## 2011-12-20 ENCOUNTER — Encounter (HOSPITAL_COMMUNITY): Payer: Self-pay | Admitting: Otolaryngology

## 2011-12-21 DIAGNOSIS — Z7901 Long term (current) use of anticoagulants: Secondary | ICD-10-CM | POA: Diagnosis not present

## 2011-12-21 DIAGNOSIS — D649 Anemia, unspecified: Secondary | ICD-10-CM | POA: Diagnosis not present

## 2011-12-21 DIAGNOSIS — R040 Epistaxis: Secondary | ICD-10-CM | POA: Diagnosis not present

## 2011-12-21 DIAGNOSIS — I4891 Unspecified atrial fibrillation: Secondary | ICD-10-CM | POA: Diagnosis not present

## 2011-12-30 DIAGNOSIS — I4891 Unspecified atrial fibrillation: Secondary | ICD-10-CM | POA: Diagnosis not present

## 2011-12-30 DIAGNOSIS — Z7901 Long term (current) use of anticoagulants: Secondary | ICD-10-CM | POA: Diagnosis not present

## 2012-01-06 DIAGNOSIS — Z7901 Long term (current) use of anticoagulants: Secondary | ICD-10-CM | POA: Diagnosis not present

## 2012-01-06 DIAGNOSIS — I4891 Unspecified atrial fibrillation: Secondary | ICD-10-CM | POA: Diagnosis not present

## 2012-01-13 DIAGNOSIS — E119 Type 2 diabetes mellitus without complications: Secondary | ICD-10-CM | POA: Diagnosis not present

## 2012-01-13 DIAGNOSIS — I4891 Unspecified atrial fibrillation: Secondary | ICD-10-CM | POA: Diagnosis not present

## 2012-01-13 DIAGNOSIS — E782 Mixed hyperlipidemia: Secondary | ICD-10-CM | POA: Diagnosis not present

## 2012-01-17 DIAGNOSIS — D649 Anemia, unspecified: Secondary | ICD-10-CM | POA: Diagnosis not present

## 2012-01-17 DIAGNOSIS — I4891 Unspecified atrial fibrillation: Secondary | ICD-10-CM | POA: Diagnosis not present

## 2012-01-17 DIAGNOSIS — R040 Epistaxis: Secondary | ICD-10-CM | POA: Diagnosis not present

## 2012-01-23 DIAGNOSIS — Z7901 Long term (current) use of anticoagulants: Secondary | ICD-10-CM | POA: Diagnosis not present

## 2012-01-23 DIAGNOSIS — I4891 Unspecified atrial fibrillation: Secondary | ICD-10-CM | POA: Diagnosis not present

## 2012-02-21 DIAGNOSIS — I4891 Unspecified atrial fibrillation: Secondary | ICD-10-CM | POA: Diagnosis not present

## 2012-02-21 DIAGNOSIS — Z7901 Long term (current) use of anticoagulants: Secondary | ICD-10-CM | POA: Diagnosis not present

## 2012-02-27 DIAGNOSIS — I4891 Unspecified atrial fibrillation: Secondary | ICD-10-CM | POA: Diagnosis not present

## 2012-03-21 DIAGNOSIS — I4891 Unspecified atrial fibrillation: Secondary | ICD-10-CM | POA: Diagnosis not present

## 2012-03-21 DIAGNOSIS — Z7901 Long term (current) use of anticoagulants: Secondary | ICD-10-CM | POA: Diagnosis not present

## 2012-03-30 DIAGNOSIS — Z7901 Long term (current) use of anticoagulants: Secondary | ICD-10-CM | POA: Diagnosis not present

## 2012-03-30 DIAGNOSIS — I4891 Unspecified atrial fibrillation: Secondary | ICD-10-CM | POA: Diagnosis not present

## 2012-04-13 DIAGNOSIS — Z7901 Long term (current) use of anticoagulants: Secondary | ICD-10-CM | POA: Diagnosis not present

## 2012-04-13 DIAGNOSIS — I4891 Unspecified atrial fibrillation: Secondary | ICD-10-CM | POA: Diagnosis not present

## 2012-04-23 DIAGNOSIS — Z23 Encounter for immunization: Secondary | ICD-10-CM | POA: Diagnosis not present

## 2012-05-16 DIAGNOSIS — Z7901 Long term (current) use of anticoagulants: Secondary | ICD-10-CM | POA: Diagnosis not present

## 2012-05-16 DIAGNOSIS — I4891 Unspecified atrial fibrillation: Secondary | ICD-10-CM | POA: Diagnosis not present

## 2012-06-11 DIAGNOSIS — I4891 Unspecified atrial fibrillation: Secondary | ICD-10-CM | POA: Diagnosis not present

## 2012-06-11 DIAGNOSIS — E119 Type 2 diabetes mellitus without complications: Secondary | ICD-10-CM | POA: Diagnosis not present

## 2012-06-11 DIAGNOSIS — E782 Mixed hyperlipidemia: Secondary | ICD-10-CM | POA: Diagnosis not present

## 2012-06-11 DIAGNOSIS — Z0000 Encounter for general adult medical examination without abnormal findings: Secondary | ICD-10-CM | POA: Diagnosis not present

## 2012-06-11 DIAGNOSIS — N529 Male erectile dysfunction, unspecified: Secondary | ICD-10-CM | POA: Diagnosis not present

## 2012-06-11 DIAGNOSIS — Z1331 Encounter for screening for depression: Secondary | ICD-10-CM | POA: Diagnosis not present

## 2012-06-19 DIAGNOSIS — I4891 Unspecified atrial fibrillation: Secondary | ICD-10-CM | POA: Diagnosis not present

## 2012-06-19 DIAGNOSIS — Z7901 Long term (current) use of anticoagulants: Secondary | ICD-10-CM | POA: Diagnosis not present

## 2012-07-23 DIAGNOSIS — I4891 Unspecified atrial fibrillation: Secondary | ICD-10-CM | POA: Diagnosis not present

## 2012-07-23 DIAGNOSIS — Z7901 Long term (current) use of anticoagulants: Secondary | ICD-10-CM | POA: Diagnosis not present

## 2012-08-20 DIAGNOSIS — Z7901 Long term (current) use of anticoagulants: Secondary | ICD-10-CM | POA: Diagnosis not present

## 2012-08-20 DIAGNOSIS — I4891 Unspecified atrial fibrillation: Secondary | ICD-10-CM | POA: Diagnosis not present

## 2012-08-31 DIAGNOSIS — E782 Mixed hyperlipidemia: Secondary | ICD-10-CM | POA: Diagnosis not present

## 2012-08-31 DIAGNOSIS — I4891 Unspecified atrial fibrillation: Secondary | ICD-10-CM | POA: Diagnosis not present

## 2012-10-16 DIAGNOSIS — Z7901 Long term (current) use of anticoagulants: Secondary | ICD-10-CM | POA: Diagnosis not present

## 2012-10-16 DIAGNOSIS — I4891 Unspecified atrial fibrillation: Secondary | ICD-10-CM | POA: Diagnosis not present

## 2012-11-27 DIAGNOSIS — I4891 Unspecified atrial fibrillation: Secondary | ICD-10-CM | POA: Diagnosis not present

## 2012-11-27 DIAGNOSIS — Z7901 Long term (current) use of anticoagulants: Secondary | ICD-10-CM | POA: Diagnosis not present

## 2013-01-01 DIAGNOSIS — Z7901 Long term (current) use of anticoagulants: Secondary | ICD-10-CM | POA: Diagnosis not present

## 2013-01-01 DIAGNOSIS — I4891 Unspecified atrial fibrillation: Secondary | ICD-10-CM | POA: Diagnosis not present

## 2013-01-08 DIAGNOSIS — I781 Nevus, non-neoplastic: Secondary | ICD-10-CM | POA: Diagnosis not present

## 2013-01-08 DIAGNOSIS — D235 Other benign neoplasm of skin of trunk: Secondary | ICD-10-CM | POA: Diagnosis not present

## 2013-01-23 DIAGNOSIS — I4891 Unspecified atrial fibrillation: Secondary | ICD-10-CM | POA: Diagnosis not present

## 2013-01-23 DIAGNOSIS — E119 Type 2 diabetes mellitus without complications: Secondary | ICD-10-CM | POA: Diagnosis not present

## 2013-01-23 DIAGNOSIS — E782 Mixed hyperlipidemia: Secondary | ICD-10-CM | POA: Diagnosis not present

## 2013-02-05 DIAGNOSIS — E119 Type 2 diabetes mellitus without complications: Secondary | ICD-10-CM | POA: Diagnosis not present

## 2013-02-05 DIAGNOSIS — H40029 Open angle with borderline findings, high risk, unspecified eye: Secondary | ICD-10-CM | POA: Diagnosis not present

## 2013-02-05 DIAGNOSIS — Z7901 Long term (current) use of anticoagulants: Secondary | ICD-10-CM | POA: Diagnosis not present

## 2013-02-05 DIAGNOSIS — H35379 Puckering of macula, unspecified eye: Secondary | ICD-10-CM | POA: Diagnosis not present

## 2013-02-05 DIAGNOSIS — I4891 Unspecified atrial fibrillation: Secondary | ICD-10-CM | POA: Diagnosis not present

## 2013-02-05 DIAGNOSIS — H35019 Changes in retinal vascular appearance, unspecified eye: Secondary | ICD-10-CM | POA: Diagnosis not present

## 2013-02-05 DIAGNOSIS — Z961 Presence of intraocular lens: Secondary | ICD-10-CM | POA: Diagnosis not present

## 2013-02-05 DIAGNOSIS — H43399 Other vitreous opacities, unspecified eye: Secondary | ICD-10-CM | POA: Diagnosis not present

## 2013-03-12 DIAGNOSIS — E7800 Pure hypercholesterolemia, unspecified: Secondary | ICD-10-CM | POA: Diagnosis not present

## 2013-03-12 DIAGNOSIS — E119 Type 2 diabetes mellitus without complications: Secondary | ICD-10-CM | POA: Diagnosis not present

## 2013-03-12 DIAGNOSIS — Z7901 Long term (current) use of anticoagulants: Secondary | ICD-10-CM | POA: Diagnosis not present

## 2013-03-12 DIAGNOSIS — I4891 Unspecified atrial fibrillation: Secondary | ICD-10-CM | POA: Diagnosis not present

## 2013-04-05 DIAGNOSIS — Z7901 Long term (current) use of anticoagulants: Secondary | ICD-10-CM | POA: Diagnosis not present

## 2013-04-05 DIAGNOSIS — I4891 Unspecified atrial fibrillation: Secondary | ICD-10-CM | POA: Diagnosis not present

## 2013-05-03 DIAGNOSIS — Z23 Encounter for immunization: Secondary | ICD-10-CM | POA: Diagnosis not present

## 2013-05-08 DIAGNOSIS — H40029 Open angle with borderline findings, high risk, unspecified eye: Secondary | ICD-10-CM | POA: Diagnosis not present

## 2013-05-10 ENCOUNTER — Ambulatory Visit (INDEPENDENT_AMBULATORY_CARE_PROVIDER_SITE_OTHER): Payer: Medicare Other | Admitting: Pharmacist

## 2013-05-10 DIAGNOSIS — I4891 Unspecified atrial fibrillation: Secondary | ICD-10-CM

## 2013-05-10 LAB — POCT INR: INR: 2

## 2013-05-30 DIAGNOSIS — K921 Melena: Secondary | ICD-10-CM | POA: Diagnosis not present

## 2013-05-30 DIAGNOSIS — I4891 Unspecified atrial fibrillation: Secondary | ICD-10-CM | POA: Diagnosis not present

## 2013-05-30 DIAGNOSIS — K649 Unspecified hemorrhoids: Secondary | ICD-10-CM | POA: Diagnosis not present

## 2013-05-30 DIAGNOSIS — Z7901 Long term (current) use of anticoagulants: Secondary | ICD-10-CM | POA: Diagnosis not present

## 2013-06-11 DIAGNOSIS — Z0000 Encounter for general adult medical examination without abnormal findings: Secondary | ICD-10-CM | POA: Diagnosis not present

## 2013-06-11 DIAGNOSIS — E119 Type 2 diabetes mellitus without complications: Secondary | ICD-10-CM | POA: Diagnosis not present

## 2013-06-11 DIAGNOSIS — Z1331 Encounter for screening for depression: Secondary | ICD-10-CM | POA: Diagnosis not present

## 2013-06-11 DIAGNOSIS — Z125 Encounter for screening for malignant neoplasm of prostate: Secondary | ICD-10-CM | POA: Diagnosis not present

## 2013-06-11 DIAGNOSIS — M109 Gout, unspecified: Secondary | ICD-10-CM | POA: Diagnosis not present

## 2013-06-11 DIAGNOSIS — E782 Mixed hyperlipidemia: Secondary | ICD-10-CM | POA: Diagnosis not present

## 2013-06-11 DIAGNOSIS — I4891 Unspecified atrial fibrillation: Secondary | ICD-10-CM | POA: Diagnosis not present

## 2013-06-11 DIAGNOSIS — I10 Essential (primary) hypertension: Secondary | ICD-10-CM | POA: Diagnosis not present

## 2013-06-11 DIAGNOSIS — N529 Male erectile dysfunction, unspecified: Secondary | ICD-10-CM | POA: Diagnosis not present

## 2013-06-14 ENCOUNTER — Encounter (INDEPENDENT_AMBULATORY_CARE_PROVIDER_SITE_OTHER): Payer: Self-pay

## 2013-06-14 ENCOUNTER — Ambulatory Visit (INDEPENDENT_AMBULATORY_CARE_PROVIDER_SITE_OTHER): Payer: Medicare Other | Admitting: Pharmacist

## 2013-06-14 DIAGNOSIS — I4891 Unspecified atrial fibrillation: Secondary | ICD-10-CM | POA: Diagnosis not present

## 2013-06-14 LAB — POCT INR: INR: 1.7

## 2013-06-28 ENCOUNTER — Telehealth: Payer: Self-pay | Admitting: Pharmacist

## 2013-06-28 ENCOUNTER — Ambulatory Visit (INDEPENDENT_AMBULATORY_CARE_PROVIDER_SITE_OTHER): Payer: Medicare Other | Admitting: Pharmacist

## 2013-06-28 DIAGNOSIS — I4891 Unspecified atrial fibrillation: Secondary | ICD-10-CM | POA: Diagnosis not present

## 2013-06-28 LAB — POCT INR: INR: 2.2

## 2013-06-28 NOTE — Telephone Encounter (Signed)
Message copied by Lou Miner on Fri Jun 28, 2013  4:01 PM ------      Message from: Jake Bathe      Created: Fri Jun 28, 2013 11:10 AM      Regarding: RE: colonoscopy       I'm fine with him holding Coumadin for 5 days prior.            Mark      ----- Message -----         From: Gaspar Skeeters Lillee Mooneyhan, RPH         Sent: 06/28/2013   8:35 AM           To: Donato Schultz, MD      Subject: colonoscopy                                              Patient scheduled to have a colonoscopy 08/07/13 by Dr. Danise Edge.  He's on coumadin for Afib.  Any objection to holding warfarin 5 days prior to procedure?            Thanks,      Riki Rusk        ------

## 2013-06-28 NOTE — Telephone Encounter (Signed)
Patient notified that it's okay to hold warfarin 5 days prior.  Will see him in 3-4 weeks for protime and will give him exact date to stop at that time based on INR.

## 2013-07-30 ENCOUNTER — Ambulatory Visit (INDEPENDENT_AMBULATORY_CARE_PROVIDER_SITE_OTHER): Payer: Medicare Other | Admitting: Pharmacist

## 2013-07-30 DIAGNOSIS — I4891 Unspecified atrial fibrillation: Secondary | ICD-10-CM | POA: Diagnosis not present

## 2013-07-30 LAB — POCT INR: INR: 2.7

## 2013-08-07 ENCOUNTER — Other Ambulatory Visit: Payer: Self-pay | Admitting: Gastroenterology

## 2013-08-07 DIAGNOSIS — D126 Benign neoplasm of colon, unspecified: Secondary | ICD-10-CM | POA: Diagnosis not present

## 2013-08-07 DIAGNOSIS — K620 Anal polyp: Secondary | ICD-10-CM | POA: Diagnosis not present

## 2013-08-07 DIAGNOSIS — K921 Melena: Secondary | ICD-10-CM | POA: Diagnosis not present

## 2013-08-22 ENCOUNTER — Ambulatory Visit (INDEPENDENT_AMBULATORY_CARE_PROVIDER_SITE_OTHER): Payer: Medicare Other | Admitting: Pharmacist

## 2013-08-22 DIAGNOSIS — I4891 Unspecified atrial fibrillation: Secondary | ICD-10-CM | POA: Diagnosis not present

## 2013-08-22 DIAGNOSIS — Z5181 Encounter for therapeutic drug level monitoring: Secondary | ICD-10-CM

## 2013-08-22 LAB — POCT INR: INR: 2.4

## 2013-09-18 ENCOUNTER — Ambulatory Visit (INDEPENDENT_AMBULATORY_CARE_PROVIDER_SITE_OTHER): Payer: Medicare Other | Admitting: Pharmacist

## 2013-09-18 DIAGNOSIS — I4891 Unspecified atrial fibrillation: Secondary | ICD-10-CM | POA: Diagnosis not present

## 2013-09-18 DIAGNOSIS — Z5181 Encounter for therapeutic drug level monitoring: Secondary | ICD-10-CM | POA: Diagnosis not present

## 2013-09-18 DIAGNOSIS — J069 Acute upper respiratory infection, unspecified: Secondary | ICD-10-CM | POA: Diagnosis not present

## 2013-09-18 LAB — POCT INR: INR: 2.3

## 2013-10-30 ENCOUNTER — Ambulatory Visit (INDEPENDENT_AMBULATORY_CARE_PROVIDER_SITE_OTHER): Payer: Medicare Other | Admitting: *Deleted

## 2013-10-30 DIAGNOSIS — Z5181 Encounter for therapeutic drug level monitoring: Secondary | ICD-10-CM

## 2013-10-30 DIAGNOSIS — I4891 Unspecified atrial fibrillation: Secondary | ICD-10-CM

## 2013-10-30 LAB — POCT INR: INR: 2.4

## 2013-12-11 ENCOUNTER — Ambulatory Visit (INDEPENDENT_AMBULATORY_CARE_PROVIDER_SITE_OTHER): Payer: Medicare Other | Admitting: *Deleted

## 2013-12-11 DIAGNOSIS — Z5181 Encounter for therapeutic drug level monitoring: Secondary | ICD-10-CM

## 2013-12-11 DIAGNOSIS — I4891 Unspecified atrial fibrillation: Secondary | ICD-10-CM

## 2013-12-11 LAB — POCT INR: INR: 3.7

## 2013-12-17 DIAGNOSIS — I4891 Unspecified atrial fibrillation: Secondary | ICD-10-CM | POA: Diagnosis not present

## 2013-12-17 DIAGNOSIS — I10 Essential (primary) hypertension: Secondary | ICD-10-CM | POA: Diagnosis not present

## 2013-12-17 DIAGNOSIS — E782 Mixed hyperlipidemia: Secondary | ICD-10-CM | POA: Diagnosis not present

## 2013-12-17 DIAGNOSIS — E119 Type 2 diabetes mellitus without complications: Secondary | ICD-10-CM | POA: Diagnosis not present

## 2013-12-17 DIAGNOSIS — M109 Gout, unspecified: Secondary | ICD-10-CM | POA: Diagnosis not present

## 2014-01-01 ENCOUNTER — Ambulatory Visit (INDEPENDENT_AMBULATORY_CARE_PROVIDER_SITE_OTHER): Payer: Medicare Other | Admitting: *Deleted

## 2014-01-01 DIAGNOSIS — I4891 Unspecified atrial fibrillation: Secondary | ICD-10-CM | POA: Diagnosis not present

## 2014-01-01 DIAGNOSIS — Z5181 Encounter for therapeutic drug level monitoring: Secondary | ICD-10-CM

## 2014-01-01 LAB — POCT INR: INR: 2.1

## 2014-01-30 ENCOUNTER — Encounter: Payer: Self-pay | Admitting: Cardiology

## 2014-02-05 ENCOUNTER — Ambulatory Visit (INDEPENDENT_AMBULATORY_CARE_PROVIDER_SITE_OTHER): Payer: Medicare Other | Admitting: *Deleted

## 2014-02-05 DIAGNOSIS — I4891 Unspecified atrial fibrillation: Secondary | ICD-10-CM

## 2014-02-05 DIAGNOSIS — Z5181 Encounter for therapeutic drug level monitoring: Secondary | ICD-10-CM

## 2014-02-05 LAB — POCT INR: INR: 3.1

## 2014-03-10 ENCOUNTER — Encounter: Payer: Self-pay | Admitting: *Deleted

## 2014-03-12 ENCOUNTER — Ambulatory Visit: Payer: Medicare Other | Admitting: Cardiology

## 2014-03-19 ENCOUNTER — Ambulatory Visit (INDEPENDENT_AMBULATORY_CARE_PROVIDER_SITE_OTHER): Payer: Medicare Other | Admitting: Cardiology

## 2014-03-19 ENCOUNTER — Encounter: Payer: Self-pay | Admitting: Cardiology

## 2014-03-19 ENCOUNTER — Ambulatory Visit (INDEPENDENT_AMBULATORY_CARE_PROVIDER_SITE_OTHER): Payer: Medicare Other | Admitting: *Deleted

## 2014-03-19 VITALS — BP 118/78 | HR 81 | Ht 67.0 in | Wt 156.0 lb

## 2014-03-19 DIAGNOSIS — I4891 Unspecified atrial fibrillation: Secondary | ICD-10-CM | POA: Diagnosis not present

## 2014-03-19 DIAGNOSIS — I10 Essential (primary) hypertension: Secondary | ICD-10-CM | POA: Diagnosis not present

## 2014-03-19 DIAGNOSIS — E785 Hyperlipidemia, unspecified: Secondary | ICD-10-CM

## 2014-03-19 DIAGNOSIS — Z5181 Encounter for therapeutic drug level monitoring: Secondary | ICD-10-CM

## 2014-03-19 LAB — POCT INR: INR: 2

## 2014-03-19 NOTE — Progress Notes (Signed)
[facility]. [address], [address] [place], [place]  [address] Phone: [phone] Fax:  [phone]  Date:  03/19/2014   ID:  Robert Irwin, Robert Irwin 10/07/1940, MRN [id number]  PCP:  Wenda Low, MD   History of Present Illness: Robert Irwin is a 73 y.o. male with atrial fibrillation, well rate controlled on chronic anticoagulation, diabetes, hyperlipidemia here for followup. He is currently doing very well. Diltiazem. Low risk nuclear stress test in November 2008. Mildly dilated left atrium. Normal EF.   Hyperlipidemia, Lipitor, fish oil.   Diabetes, excellent job.  Dr. Lysle Rubens.   Alcohol use is noted.    Wt Readings from Last 3 Encounters:  03/19/14 156 lb (70.761 kg)  12/17/11 149 lb 1.6 oz (67.631 kg)  12/17/11 149 lb 1.6 oz (67.631 kg)     Past Medical History  Diagnosis Date  . A-fib   . Hyperlipidemia   . Diabetes mellitus   . Hypertension   . Epistaxis   . Atrial fibrillation   . ED (erectile dysfunction)     Past Surgical History  Procedure Laterality Date  . Nasal mass excision    . Nasal sinus surgery  12/16/2011    Procedure: ENDOSCOPIC SINUS SURGERY;  Surgeon: Rozetta Nunnery, MD;  Location: [facility];  Service: ENT;  Laterality: N/A;  for control of epistaxis    Current Outpatient Prescriptions  Medication Sig Dispense Refill  . atorvastatin (LIPITOR) 20 MG tablet Take 20 mg by mouth daily.      . calcium citrate-vitamin D 200-200 MG-UNIT TABS Take 1 tablet by mouth daily.      Marland Kitchen diltiazem (TIAZAC) 240 MG 24 hr capsule Take 240 mg by mouth daily.      . fish oil-omega-3 fatty acids 1000 MG capsule Take 2 g by mouth daily.      . fluocinonide ointment (LIDEX) 1.85 % Apply 1 application topically 2 (two) times daily.      Marland Kitchen glimepiride (AMARYL) 2 MG tablet Take 1 mg by mouth daily before breakfast.      . GLUCOSAMINE PO Take 1 tablet by mouth daily. Hold while in hospital      . lisinopril (PRINIVIL,ZESTRIL) 2.5 MG tablet Take 2.5 mg by mouth daily.      .  Multiple Vitamin (MULITIVITAMIN WITH MINERALS) TABS Take 1 tablet by mouth daily.      Marland Kitchen warfarin (COUMADIN) 5 MG tablet        No current facility-administered medications for this visit.    Allergies:   No Known Allergies  Social History:  The patient  reports that he quit smoking about 23 years ago. He does not have any smokeless tobacco history on file. He reports that he drinks alcohol. He reports that he does not use illicit drugs.   No family history on file.  ROS:  Please see the history of present illness.   Denies any bleeding, syncope, orthopnea, PND   All other systems reviewed and negative.   PHYSICAL EXAM: VS:  BP 118/78  Pulse 81  Ht 5\' 7"  (1.702 m)  Wt 156 lb (70.761 kg)  BMI 24.43 kg/m2 Well nourished, well developed, in no acute distress HEENT: normal, [place]/AT, EOMI Neck: no JVD, normal carotid upstroke, no bruit Cardiac:  normal S1, S2; irregularly irregular, normal rate; no murmur Lungs:  clear to auscultation bilaterally, no wheezing, rhonchi or rales Abd: soft, nontender, no hepatomegaly, no bruits Ext: no edema, 2+ distal pulses Skin: warm and  dry GU: deferred Neuro: no focal abnormalities noted, AAO x 3  EKG:  03/19/14-atrial fibrillation heart rate 81 beats per minute, no other ST segment changes    LDL: 83  ASSESSMENT AND PLAN:  1. Chronic atrial fibrillation-overall doing very well, well rate controlled. No symptoms. No bleeding. 2. Chronic anticoagulation-Coumadin. Therapeutic today. Appreciate pharmacy assistance. 3. Hyperlipidemia-LDL 83 as above. Atorvastatin. Excellent. 4. Diabetes-well controlled, Dr. Lysle Rubens. 5. Hypertension-excellent control. Low-dose ACE inhibitor, diabetes.  Signed, Candee Furbish, MD [facility]  03/19/2014 8:59 AM

## 2014-03-19 NOTE — Patient Instructions (Signed)
The current medical regimen is effective;  continue present plan and medications.  Follow up in 6 months with Dr. Skains.  You will receive a letter in the mail 2 months before you are due.  Please call us when you receive this letter to schedule your follow up appointment.  

## 2014-03-24 ENCOUNTER — Ambulatory Visit: Payer: Medicare Other | Admitting: Cardiology

## 2014-04-02 ENCOUNTER — Encounter: Payer: Self-pay | Admitting: Cardiology

## 2014-04-04 DIAGNOSIS — Z23 Encounter for immunization: Secondary | ICD-10-CM | POA: Diagnosis not present

## 2014-04-24 ENCOUNTER — Ambulatory Visit (INDEPENDENT_AMBULATORY_CARE_PROVIDER_SITE_OTHER): Payer: Medicare Other | Admitting: *Deleted

## 2014-04-24 DIAGNOSIS — Z5181 Encounter for therapeutic drug level monitoring: Secondary | ICD-10-CM

## 2014-04-24 DIAGNOSIS — I4891 Unspecified atrial fibrillation: Secondary | ICD-10-CM

## 2014-04-24 LAB — POCT INR: INR: 1.9

## 2014-05-15 DIAGNOSIS — H40023 Open angle with borderline findings, high risk, bilateral: Secondary | ICD-10-CM | POA: Diagnosis not present

## 2014-05-23 ENCOUNTER — Ambulatory Visit (INDEPENDENT_AMBULATORY_CARE_PROVIDER_SITE_OTHER): Payer: Medicare Other | Admitting: *Deleted

## 2014-05-23 DIAGNOSIS — I4891 Unspecified atrial fibrillation: Secondary | ICD-10-CM | POA: Diagnosis not present

## 2014-05-23 DIAGNOSIS — Z5181 Encounter for therapeutic drug level monitoring: Secondary | ICD-10-CM

## 2014-05-23 LAB — POCT INR: INR: 1.9

## 2014-06-13 ENCOUNTER — Ambulatory Visit (INDEPENDENT_AMBULATORY_CARE_PROVIDER_SITE_OTHER): Payer: Medicare Other | Admitting: *Deleted

## 2014-06-13 DIAGNOSIS — Z5181 Encounter for therapeutic drug level monitoring: Secondary | ICD-10-CM

## 2014-06-13 DIAGNOSIS — I4891 Unspecified atrial fibrillation: Secondary | ICD-10-CM | POA: Diagnosis not present

## 2014-06-13 LAB — POCT INR: INR: 2.5

## 2014-07-02 DIAGNOSIS — Z125 Encounter for screening for malignant neoplasm of prostate: Secondary | ICD-10-CM | POA: Diagnosis not present

## 2014-07-02 DIAGNOSIS — E119 Type 2 diabetes mellitus without complications: Secondary | ICD-10-CM | POA: Diagnosis not present

## 2014-07-02 DIAGNOSIS — J309 Allergic rhinitis, unspecified: Secondary | ICD-10-CM | POA: Diagnosis not present

## 2014-07-02 DIAGNOSIS — M109 Gout, unspecified: Secondary | ICD-10-CM | POA: Diagnosis not present

## 2014-07-02 DIAGNOSIS — Z0001 Encounter for general adult medical examination with abnormal findings: Secondary | ICD-10-CM | POA: Diagnosis not present

## 2014-07-02 DIAGNOSIS — I482 Chronic atrial fibrillation: Secondary | ICD-10-CM | POA: Diagnosis not present

## 2014-07-02 DIAGNOSIS — E782 Mixed hyperlipidemia: Secondary | ICD-10-CM | POA: Diagnosis not present

## 2014-07-02 DIAGNOSIS — Z1389 Encounter for screening for other disorder: Secondary | ICD-10-CM | POA: Diagnosis not present

## 2014-07-14 ENCOUNTER — Ambulatory Visit (INDEPENDENT_AMBULATORY_CARE_PROVIDER_SITE_OTHER): Payer: Medicare Other | Admitting: Pharmacist

## 2014-07-14 DIAGNOSIS — I4891 Unspecified atrial fibrillation: Secondary | ICD-10-CM

## 2014-07-14 DIAGNOSIS — Z5181 Encounter for therapeutic drug level monitoring: Secondary | ICD-10-CM | POA: Diagnosis not present

## 2014-07-14 LAB — POCT INR: INR: 3.5

## 2014-08-04 ENCOUNTER — Ambulatory Visit (INDEPENDENT_AMBULATORY_CARE_PROVIDER_SITE_OTHER): Payer: Medicare Other

## 2014-08-04 DIAGNOSIS — I4891 Unspecified atrial fibrillation: Secondary | ICD-10-CM | POA: Diagnosis not present

## 2014-08-04 DIAGNOSIS — Z5181 Encounter for therapeutic drug level monitoring: Secondary | ICD-10-CM | POA: Diagnosis not present

## 2014-08-04 LAB — POCT INR: INR: 2.9

## 2014-08-06 DIAGNOSIS — H40023 Open angle with borderline findings, high risk, bilateral: Secondary | ICD-10-CM | POA: Diagnosis not present

## 2014-08-06 DIAGNOSIS — H43813 Vitreous degeneration, bilateral: Secondary | ICD-10-CM | POA: Diagnosis not present

## 2014-08-06 DIAGNOSIS — Z961 Presence of intraocular lens: Secondary | ICD-10-CM | POA: Diagnosis not present

## 2014-08-06 DIAGNOSIS — E119 Type 2 diabetes mellitus without complications: Secondary | ICD-10-CM | POA: Diagnosis not present

## 2014-08-06 DIAGNOSIS — D3132 Benign neoplasm of left choroid: Secondary | ICD-10-CM | POA: Diagnosis not present

## 2014-08-14 DIAGNOSIS — J069 Acute upper respiratory infection, unspecified: Secondary | ICD-10-CM | POA: Diagnosis not present

## 2014-09-01 ENCOUNTER — Ambulatory Visit (INDEPENDENT_AMBULATORY_CARE_PROVIDER_SITE_OTHER): Payer: Medicare Other | Admitting: *Deleted

## 2014-09-01 DIAGNOSIS — Z5181 Encounter for therapeutic drug level monitoring: Secondary | ICD-10-CM

## 2014-09-01 DIAGNOSIS — I4891 Unspecified atrial fibrillation: Secondary | ICD-10-CM | POA: Diagnosis not present

## 2014-09-01 LAB — POCT INR: INR: 3.6

## 2014-09-22 ENCOUNTER — Ambulatory Visit (INDEPENDENT_AMBULATORY_CARE_PROVIDER_SITE_OTHER): Payer: Medicare Other | Admitting: *Deleted

## 2014-09-22 DIAGNOSIS — I4891 Unspecified atrial fibrillation: Secondary | ICD-10-CM | POA: Diagnosis not present

## 2014-09-22 DIAGNOSIS — Z5181 Encounter for therapeutic drug level monitoring: Secondary | ICD-10-CM | POA: Diagnosis not present

## 2014-09-22 LAB — POCT INR: INR: 3.6

## 2014-10-06 ENCOUNTER — Ambulatory Visit (INDEPENDENT_AMBULATORY_CARE_PROVIDER_SITE_OTHER): Payer: Medicare Other

## 2014-10-06 DIAGNOSIS — I4891 Unspecified atrial fibrillation: Secondary | ICD-10-CM | POA: Diagnosis not present

## 2014-10-06 DIAGNOSIS — Z5181 Encounter for therapeutic drug level monitoring: Secondary | ICD-10-CM

## 2014-10-06 LAB — POCT INR: INR: 2.6

## 2014-11-03 ENCOUNTER — Ambulatory Visit (INDEPENDENT_AMBULATORY_CARE_PROVIDER_SITE_OTHER): Payer: Medicare Other | Admitting: *Deleted

## 2014-11-03 DIAGNOSIS — Z5181 Encounter for therapeutic drug level monitoring: Secondary | ICD-10-CM | POA: Diagnosis not present

## 2014-11-03 DIAGNOSIS — I4891 Unspecified atrial fibrillation: Secondary | ICD-10-CM | POA: Diagnosis not present

## 2014-11-03 LAB — POCT INR: INR: 4.1

## 2014-11-19 DIAGNOSIS — J069 Acute upper respiratory infection, unspecified: Secondary | ICD-10-CM | POA: Diagnosis not present

## 2014-12-01 ENCOUNTER — Ambulatory Visit (INDEPENDENT_AMBULATORY_CARE_PROVIDER_SITE_OTHER): Payer: Medicare Other | Admitting: *Deleted

## 2014-12-01 DIAGNOSIS — I4891 Unspecified atrial fibrillation: Secondary | ICD-10-CM

## 2014-12-01 DIAGNOSIS — Z5181 Encounter for therapeutic drug level monitoring: Secondary | ICD-10-CM

## 2014-12-01 LAB — POCT INR: INR: 2.2

## 2014-12-29 ENCOUNTER — Ambulatory Visit (INDEPENDENT_AMBULATORY_CARE_PROVIDER_SITE_OTHER): Payer: Medicare Other

## 2014-12-29 DIAGNOSIS — I4891 Unspecified atrial fibrillation: Secondary | ICD-10-CM | POA: Diagnosis not present

## 2014-12-29 DIAGNOSIS — Z5181 Encounter for therapeutic drug level monitoring: Secondary | ICD-10-CM | POA: Diagnosis not present

## 2014-12-29 LAB — POCT INR: INR: 2.3

## 2015-01-02 DIAGNOSIS — E119 Type 2 diabetes mellitus without complications: Secondary | ICD-10-CM | POA: Diagnosis not present

## 2015-01-02 DIAGNOSIS — E782 Mixed hyperlipidemia: Secondary | ICD-10-CM | POA: Diagnosis not present

## 2015-01-02 DIAGNOSIS — I482 Chronic atrial fibrillation: Secondary | ICD-10-CM | POA: Diagnosis not present

## 2015-01-02 DIAGNOSIS — Z23 Encounter for immunization: Secondary | ICD-10-CM | POA: Diagnosis not present

## 2015-02-02 ENCOUNTER — Ambulatory Visit (INDEPENDENT_AMBULATORY_CARE_PROVIDER_SITE_OTHER): Payer: Medicare Other | Admitting: *Deleted

## 2015-02-02 DIAGNOSIS — I4891 Unspecified atrial fibrillation: Secondary | ICD-10-CM | POA: Diagnosis not present

## 2015-02-02 DIAGNOSIS — Z5181 Encounter for therapeutic drug level monitoring: Secondary | ICD-10-CM | POA: Diagnosis not present

## 2015-02-02 LAB — POCT INR: INR: 3.1

## 2015-03-02 ENCOUNTER — Ambulatory Visit (INDEPENDENT_AMBULATORY_CARE_PROVIDER_SITE_OTHER): Payer: Medicare Other | Admitting: *Deleted

## 2015-03-02 DIAGNOSIS — I4891 Unspecified atrial fibrillation: Secondary | ICD-10-CM | POA: Diagnosis not present

## 2015-03-02 DIAGNOSIS — Z5181 Encounter for therapeutic drug level monitoring: Secondary | ICD-10-CM | POA: Diagnosis not present

## 2015-03-02 LAB — POCT INR: INR: 3.3

## 2015-03-19 DIAGNOSIS — R350 Frequency of micturition: Secondary | ICD-10-CM | POA: Diagnosis not present

## 2015-03-19 DIAGNOSIS — R351 Nocturia: Secondary | ICD-10-CM | POA: Diagnosis not present

## 2015-03-23 ENCOUNTER — Ambulatory Visit (INDEPENDENT_AMBULATORY_CARE_PROVIDER_SITE_OTHER): Payer: Medicare Other | Admitting: *Deleted

## 2015-03-23 DIAGNOSIS — Z5181 Encounter for therapeutic drug level monitoring: Secondary | ICD-10-CM

## 2015-03-23 DIAGNOSIS — I4891 Unspecified atrial fibrillation: Secondary | ICD-10-CM

## 2015-03-23 LAB — POCT INR: INR: 2.3

## 2015-04-22 ENCOUNTER — Ambulatory Visit (INDEPENDENT_AMBULATORY_CARE_PROVIDER_SITE_OTHER): Payer: Medicare Other | Admitting: *Deleted

## 2015-04-22 DIAGNOSIS — Z5181 Encounter for therapeutic drug level monitoring: Secondary | ICD-10-CM | POA: Diagnosis not present

## 2015-04-22 DIAGNOSIS — I4891 Unspecified atrial fibrillation: Secondary | ICD-10-CM

## 2015-04-22 LAB — POCT INR: INR: 2.2

## 2015-04-28 DIAGNOSIS — Z23 Encounter for immunization: Secondary | ICD-10-CM | POA: Diagnosis not present

## 2015-04-28 DIAGNOSIS — M109 Gout, unspecified: Secondary | ICD-10-CM | POA: Diagnosis not present

## 2015-05-01 DIAGNOSIS — R351 Nocturia: Secondary | ICD-10-CM | POA: Diagnosis not present

## 2015-05-20 DIAGNOSIS — H40023 Open angle with borderline findings, high risk, bilateral: Secondary | ICD-10-CM | POA: Diagnosis not present

## 2015-05-28 ENCOUNTER — Ambulatory Visit (INDEPENDENT_AMBULATORY_CARE_PROVIDER_SITE_OTHER): Payer: Medicare Other | Admitting: *Deleted

## 2015-05-28 DIAGNOSIS — I4891 Unspecified atrial fibrillation: Secondary | ICD-10-CM | POA: Diagnosis not present

## 2015-05-28 DIAGNOSIS — Z5181 Encounter for therapeutic drug level monitoring: Secondary | ICD-10-CM | POA: Diagnosis not present

## 2015-05-28 LAB — POCT INR: INR: 1.7

## 2015-06-10 ENCOUNTER — Ambulatory Visit (INDEPENDENT_AMBULATORY_CARE_PROVIDER_SITE_OTHER): Payer: Medicare Other | Admitting: *Deleted

## 2015-06-10 DIAGNOSIS — I4891 Unspecified atrial fibrillation: Secondary | ICD-10-CM

## 2015-06-10 DIAGNOSIS — Z5181 Encounter for therapeutic drug level monitoring: Secondary | ICD-10-CM

## 2015-06-10 LAB — POCT INR: INR: 2

## 2015-07-03 ENCOUNTER — Ambulatory Visit (INDEPENDENT_AMBULATORY_CARE_PROVIDER_SITE_OTHER): Payer: Medicare Other | Admitting: *Deleted

## 2015-07-03 DIAGNOSIS — I4891 Unspecified atrial fibrillation: Secondary | ICD-10-CM

## 2015-07-03 DIAGNOSIS — Z5181 Encounter for therapeutic drug level monitoring: Secondary | ICD-10-CM | POA: Diagnosis not present

## 2015-07-03 LAB — POCT INR: INR: 2.2

## 2015-07-23 DIAGNOSIS — E119 Type 2 diabetes mellitus without complications: Secondary | ICD-10-CM | POA: Diagnosis not present

## 2015-07-23 DIAGNOSIS — M109 Gout, unspecified: Secondary | ICD-10-CM | POA: Diagnosis not present

## 2015-07-23 DIAGNOSIS — Z7984 Long term (current) use of oral hypoglycemic drugs: Secondary | ICD-10-CM | POA: Diagnosis not present

## 2015-07-23 DIAGNOSIS — N400 Enlarged prostate without lower urinary tract symptoms: Secondary | ICD-10-CM | POA: Diagnosis not present

## 2015-07-23 DIAGNOSIS — M25521 Pain in right elbow: Secondary | ICD-10-CM | POA: Diagnosis not present

## 2015-07-23 DIAGNOSIS — M519 Unspecified thoracic, thoracolumbar and lumbosacral intervertebral disc disorder: Secondary | ICD-10-CM | POA: Diagnosis not present

## 2015-07-23 DIAGNOSIS — Z1389 Encounter for screening for other disorder: Secondary | ICD-10-CM | POA: Diagnosis not present

## 2015-07-23 DIAGNOSIS — M1990 Unspecified osteoarthritis, unspecified site: Secondary | ICD-10-CM | POA: Diagnosis not present

## 2015-07-23 DIAGNOSIS — E782 Mixed hyperlipidemia: Secondary | ICD-10-CM | POA: Diagnosis not present

## 2015-07-23 DIAGNOSIS — Z0000 Encounter for general adult medical examination without abnormal findings: Secondary | ICD-10-CM | POA: Diagnosis not present

## 2015-07-23 DIAGNOSIS — I482 Chronic atrial fibrillation: Secondary | ICD-10-CM | POA: Diagnosis not present

## 2015-07-23 DIAGNOSIS — I10 Essential (primary) hypertension: Secondary | ICD-10-CM | POA: Diagnosis not present

## 2015-07-31 ENCOUNTER — Ambulatory Visit (INDEPENDENT_AMBULATORY_CARE_PROVIDER_SITE_OTHER): Payer: Medicare Other | Admitting: *Deleted

## 2015-07-31 DIAGNOSIS — Z5181 Encounter for therapeutic drug level monitoring: Secondary | ICD-10-CM

## 2015-07-31 DIAGNOSIS — I4891 Unspecified atrial fibrillation: Secondary | ICD-10-CM | POA: Diagnosis not present

## 2015-07-31 LAB — POCT INR: INR: 2.4

## 2015-08-14 DIAGNOSIS — Z961 Presence of intraocular lens: Secondary | ICD-10-CM | POA: Diagnosis not present

## 2015-08-14 DIAGNOSIS — H40023 Open angle with borderline findings, high risk, bilateral: Secondary | ICD-10-CM | POA: Diagnosis not present

## 2015-08-14 DIAGNOSIS — E119 Type 2 diabetes mellitus without complications: Secondary | ICD-10-CM | POA: Diagnosis not present

## 2015-09-18 ENCOUNTER — Ambulatory Visit (INDEPENDENT_AMBULATORY_CARE_PROVIDER_SITE_OTHER): Payer: Medicare Other | Admitting: Surgery

## 2015-09-18 DIAGNOSIS — I4891 Unspecified atrial fibrillation: Secondary | ICD-10-CM

## 2015-09-18 DIAGNOSIS — Z5181 Encounter for therapeutic drug level monitoring: Secondary | ICD-10-CM | POA: Diagnosis not present

## 2015-09-18 LAB — POCT INR: INR: 1.9

## 2015-10-16 ENCOUNTER — Ambulatory Visit (INDEPENDENT_AMBULATORY_CARE_PROVIDER_SITE_OTHER): Payer: Medicare Other | Admitting: *Deleted

## 2015-10-16 DIAGNOSIS — I4891 Unspecified atrial fibrillation: Secondary | ICD-10-CM

## 2015-10-16 DIAGNOSIS — Z5181 Encounter for therapeutic drug level monitoring: Secondary | ICD-10-CM | POA: Diagnosis not present

## 2015-10-16 LAB — POCT INR: INR: 2.8

## 2015-11-27 ENCOUNTER — Ambulatory Visit (INDEPENDENT_AMBULATORY_CARE_PROVIDER_SITE_OTHER): Payer: Medicare Other | Admitting: *Deleted

## 2015-11-27 DIAGNOSIS — I4891 Unspecified atrial fibrillation: Secondary | ICD-10-CM | POA: Diagnosis not present

## 2015-11-27 DIAGNOSIS — Z5181 Encounter for therapeutic drug level monitoring: Secondary | ICD-10-CM

## 2015-11-27 LAB — POCT INR: INR: 3.1

## 2016-01-08 ENCOUNTER — Ambulatory Visit (INDEPENDENT_AMBULATORY_CARE_PROVIDER_SITE_OTHER): Payer: Medicare Other | Admitting: *Deleted

## 2016-01-08 DIAGNOSIS — I4891 Unspecified atrial fibrillation: Secondary | ICD-10-CM

## 2016-01-08 DIAGNOSIS — Z5181 Encounter for therapeutic drug level monitoring: Secondary | ICD-10-CM | POA: Diagnosis not present

## 2016-01-08 LAB — POCT INR: INR: 3.7

## 2016-01-20 DIAGNOSIS — I482 Chronic atrial fibrillation: Secondary | ICD-10-CM | POA: Diagnosis not present

## 2016-01-20 DIAGNOSIS — Z7984 Long term (current) use of oral hypoglycemic drugs: Secondary | ICD-10-CM | POA: Diagnosis not present

## 2016-01-20 DIAGNOSIS — I10 Essential (primary) hypertension: Secondary | ICD-10-CM | POA: Diagnosis not present

## 2016-01-20 DIAGNOSIS — M109 Gout, unspecified: Secondary | ICD-10-CM | POA: Diagnosis not present

## 2016-01-20 DIAGNOSIS — E782 Mixed hyperlipidemia: Secondary | ICD-10-CM | POA: Diagnosis not present

## 2016-01-20 DIAGNOSIS — E119 Type 2 diabetes mellitus without complications: Secondary | ICD-10-CM | POA: Diagnosis not present

## 2016-01-22 ENCOUNTER — Ambulatory Visit (INDEPENDENT_AMBULATORY_CARE_PROVIDER_SITE_OTHER): Payer: Medicare Other | Admitting: *Deleted

## 2016-01-22 DIAGNOSIS — Z5181 Encounter for therapeutic drug level monitoring: Secondary | ICD-10-CM

## 2016-01-22 DIAGNOSIS — I4891 Unspecified atrial fibrillation: Secondary | ICD-10-CM | POA: Diagnosis not present

## 2016-01-22 LAB — POCT INR: INR: 2.3

## 2016-02-17 ENCOUNTER — Ambulatory Visit (INDEPENDENT_AMBULATORY_CARE_PROVIDER_SITE_OTHER): Payer: Medicare Other | Admitting: *Deleted

## 2016-02-17 DIAGNOSIS — Z5181 Encounter for therapeutic drug level monitoring: Secondary | ICD-10-CM

## 2016-02-17 DIAGNOSIS — I4891 Unspecified atrial fibrillation: Secondary | ICD-10-CM

## 2016-02-17 LAB — POCT INR: INR: 2.3

## 2016-03-24 ENCOUNTER — Ambulatory Visit (INDEPENDENT_AMBULATORY_CARE_PROVIDER_SITE_OTHER): Payer: Medicare Other | Admitting: *Deleted

## 2016-03-24 DIAGNOSIS — I4891 Unspecified atrial fibrillation: Secondary | ICD-10-CM

## 2016-03-24 DIAGNOSIS — Z5181 Encounter for therapeutic drug level monitoring: Secondary | ICD-10-CM

## 2016-03-24 LAB — POCT INR: INR: 2.5

## 2016-04-06 DIAGNOSIS — Z23 Encounter for immunization: Secondary | ICD-10-CM | POA: Diagnosis not present

## 2016-05-05 ENCOUNTER — Ambulatory Visit (INDEPENDENT_AMBULATORY_CARE_PROVIDER_SITE_OTHER): Payer: Medicare Other

## 2016-05-05 DIAGNOSIS — Z5181 Encounter for therapeutic drug level monitoring: Secondary | ICD-10-CM | POA: Diagnosis not present

## 2016-05-05 DIAGNOSIS — I4891 Unspecified atrial fibrillation: Secondary | ICD-10-CM | POA: Diagnosis not present

## 2016-05-05 LAB — POCT INR: INR: 2.1

## 2016-05-06 DIAGNOSIS — Q103 Other congenital malformations of eyelid: Secondary | ICD-10-CM | POA: Diagnosis not present

## 2016-05-06 DIAGNOSIS — H04123 Dry eye syndrome of bilateral lacrimal glands: Secondary | ICD-10-CM | POA: Diagnosis not present

## 2016-05-06 DIAGNOSIS — H40023 Open angle with borderline findings, high risk, bilateral: Secondary | ICD-10-CM | POA: Diagnosis not present

## 2016-05-06 DIAGNOSIS — H02054 Trichiasis without entropian left upper eyelid: Secondary | ICD-10-CM | POA: Diagnosis not present

## 2016-06-23 ENCOUNTER — Ambulatory Visit (INDEPENDENT_AMBULATORY_CARE_PROVIDER_SITE_OTHER): Payer: Medicare Other | Admitting: *Deleted

## 2016-06-23 DIAGNOSIS — I4891 Unspecified atrial fibrillation: Secondary | ICD-10-CM

## 2016-06-23 DIAGNOSIS — Z5181 Encounter for therapeutic drug level monitoring: Secondary | ICD-10-CM

## 2016-06-23 LAB — POCT INR: INR: 1.4

## 2016-07-01 ENCOUNTER — Ambulatory Visit (INDEPENDENT_AMBULATORY_CARE_PROVIDER_SITE_OTHER): Payer: Medicare Other | Admitting: *Deleted

## 2016-07-01 DIAGNOSIS — I4891 Unspecified atrial fibrillation: Secondary | ICD-10-CM | POA: Diagnosis not present

## 2016-07-01 DIAGNOSIS — Z5181 Encounter for therapeutic drug level monitoring: Secondary | ICD-10-CM

## 2016-07-01 LAB — POCT INR: INR: 2

## 2016-07-22 ENCOUNTER — Ambulatory Visit (INDEPENDENT_AMBULATORY_CARE_PROVIDER_SITE_OTHER): Payer: Medicare Other | Admitting: *Deleted

## 2016-07-22 DIAGNOSIS — Z5181 Encounter for therapeutic drug level monitoring: Secondary | ICD-10-CM

## 2016-07-22 DIAGNOSIS — I4891 Unspecified atrial fibrillation: Secondary | ICD-10-CM | POA: Diagnosis not present

## 2016-07-22 LAB — POCT INR: INR: 1.7

## 2016-08-05 ENCOUNTER — Ambulatory Visit (INDEPENDENT_AMBULATORY_CARE_PROVIDER_SITE_OTHER): Payer: Medicare Other | Admitting: *Deleted

## 2016-08-05 DIAGNOSIS — Z5181 Encounter for therapeutic drug level monitoring: Secondary | ICD-10-CM | POA: Diagnosis not present

## 2016-08-05 DIAGNOSIS — I4891 Unspecified atrial fibrillation: Secondary | ICD-10-CM | POA: Diagnosis not present

## 2016-08-05 LAB — POCT INR: INR: 2.1

## 2016-08-17 DIAGNOSIS — Z1389 Encounter for screening for other disorder: Secondary | ICD-10-CM | POA: Diagnosis not present

## 2016-08-17 DIAGNOSIS — E782 Mixed hyperlipidemia: Secondary | ICD-10-CM | POA: Diagnosis not present

## 2016-08-17 DIAGNOSIS — I482 Chronic atrial fibrillation: Secondary | ICD-10-CM | POA: Diagnosis not present

## 2016-08-17 DIAGNOSIS — K649 Unspecified hemorrhoids: Secondary | ICD-10-CM | POA: Diagnosis not present

## 2016-08-17 DIAGNOSIS — J309 Allergic rhinitis, unspecified: Secondary | ICD-10-CM | POA: Diagnosis not present

## 2016-08-17 DIAGNOSIS — I10 Essential (primary) hypertension: Secondary | ICD-10-CM | POA: Diagnosis not present

## 2016-08-17 DIAGNOSIS — N400 Enlarged prostate without lower urinary tract symptoms: Secondary | ICD-10-CM | POA: Diagnosis not present

## 2016-08-17 DIAGNOSIS — Z0000 Encounter for general adult medical examination without abnormal findings: Secondary | ICD-10-CM | POA: Diagnosis not present

## 2016-08-17 DIAGNOSIS — E119 Type 2 diabetes mellitus without complications: Secondary | ICD-10-CM | POA: Diagnosis not present

## 2016-08-17 DIAGNOSIS — M109 Gout, unspecified: Secondary | ICD-10-CM | POA: Diagnosis not present

## 2016-08-26 ENCOUNTER — Encounter (INDEPENDENT_AMBULATORY_CARE_PROVIDER_SITE_OTHER): Payer: Self-pay

## 2016-08-26 ENCOUNTER — Ambulatory Visit (INDEPENDENT_AMBULATORY_CARE_PROVIDER_SITE_OTHER): Payer: Medicare Other | Admitting: *Deleted

## 2016-08-26 DIAGNOSIS — Z5181 Encounter for therapeutic drug level monitoring: Secondary | ICD-10-CM | POA: Diagnosis not present

## 2016-08-26 DIAGNOSIS — I4891 Unspecified atrial fibrillation: Secondary | ICD-10-CM | POA: Diagnosis not present

## 2016-08-26 LAB — POCT INR: INR: 2.4

## 2016-08-30 DIAGNOSIS — K642 Third degree hemorrhoids: Secondary | ICD-10-CM | POA: Diagnosis not present

## 2016-09-23 ENCOUNTER — Ambulatory Visit (INDEPENDENT_AMBULATORY_CARE_PROVIDER_SITE_OTHER): Payer: Medicare Other | Admitting: *Deleted

## 2016-09-23 DIAGNOSIS — I4891 Unspecified atrial fibrillation: Secondary | ICD-10-CM

## 2016-09-23 DIAGNOSIS — Z5181 Encounter for therapeutic drug level monitoring: Secondary | ICD-10-CM | POA: Diagnosis not present

## 2016-09-23 LAB — POCT INR: INR: 2.8

## 2016-11-10 ENCOUNTER — Ambulatory Visit (INDEPENDENT_AMBULATORY_CARE_PROVIDER_SITE_OTHER): Payer: Medicare Other | Admitting: *Deleted

## 2016-11-10 DIAGNOSIS — Z5181 Encounter for therapeutic drug level monitoring: Secondary | ICD-10-CM | POA: Diagnosis not present

## 2016-11-10 DIAGNOSIS — I4891 Unspecified atrial fibrillation: Secondary | ICD-10-CM

## 2016-11-10 LAB — POCT INR: INR: 2.3

## 2016-12-22 ENCOUNTER — Ambulatory Visit (INDEPENDENT_AMBULATORY_CARE_PROVIDER_SITE_OTHER): Payer: Medicare Other | Admitting: *Deleted

## 2016-12-22 DIAGNOSIS — I4891 Unspecified atrial fibrillation: Secondary | ICD-10-CM

## 2016-12-22 DIAGNOSIS — Z5181 Encounter for therapeutic drug level monitoring: Secondary | ICD-10-CM

## 2016-12-22 LAB — POCT INR: INR: 3

## 2016-12-23 DIAGNOSIS — H40023 Open angle with borderline findings, high risk, bilateral: Secondary | ICD-10-CM | POA: Diagnosis not present

## 2016-12-23 DIAGNOSIS — H35363 Drusen (degenerative) of macula, bilateral: Secondary | ICD-10-CM | POA: Diagnosis not present

## 2016-12-23 DIAGNOSIS — E119 Type 2 diabetes mellitus without complications: Secondary | ICD-10-CM | POA: Diagnosis not present

## 2016-12-23 DIAGNOSIS — Z961 Presence of intraocular lens: Secondary | ICD-10-CM | POA: Diagnosis not present

## 2017-02-03 ENCOUNTER — Ambulatory Visit (INDEPENDENT_AMBULATORY_CARE_PROVIDER_SITE_OTHER): Payer: Medicare Other

## 2017-02-03 ENCOUNTER — Encounter (INDEPENDENT_AMBULATORY_CARE_PROVIDER_SITE_OTHER): Payer: Self-pay

## 2017-02-03 DIAGNOSIS — Z5181 Encounter for therapeutic drug level monitoring: Secondary | ICD-10-CM

## 2017-02-03 DIAGNOSIS — I4891 Unspecified atrial fibrillation: Secondary | ICD-10-CM | POA: Diagnosis not present

## 2017-02-03 LAB — POCT INR: INR: 3

## 2017-03-07 DIAGNOSIS — Z7984 Long term (current) use of oral hypoglycemic drugs: Secondary | ICD-10-CM | POA: Diagnosis not present

## 2017-03-07 DIAGNOSIS — I482 Chronic atrial fibrillation: Secondary | ICD-10-CM | POA: Diagnosis not present

## 2017-03-07 DIAGNOSIS — M109 Gout, unspecified: Secondary | ICD-10-CM | POA: Diagnosis not present

## 2017-03-07 DIAGNOSIS — I10 Essential (primary) hypertension: Secondary | ICD-10-CM | POA: Diagnosis not present

## 2017-03-07 DIAGNOSIS — E782 Mixed hyperlipidemia: Secondary | ICD-10-CM | POA: Diagnosis not present

## 2017-03-07 DIAGNOSIS — E119 Type 2 diabetes mellitus without complications: Secondary | ICD-10-CM | POA: Diagnosis not present

## 2017-03-07 DIAGNOSIS — E1165 Type 2 diabetes mellitus with hyperglycemia: Secondary | ICD-10-CM | POA: Diagnosis not present

## 2017-03-20 ENCOUNTER — Ambulatory Visit (INDEPENDENT_AMBULATORY_CARE_PROVIDER_SITE_OTHER): Payer: Medicare Other | Admitting: Pharmacist

## 2017-03-20 DIAGNOSIS — Z5181 Encounter for therapeutic drug level monitoring: Secondary | ICD-10-CM | POA: Diagnosis not present

## 2017-03-20 DIAGNOSIS — I4891 Unspecified atrial fibrillation: Secondary | ICD-10-CM | POA: Diagnosis not present

## 2017-03-20 LAB — POCT INR: INR: 4.3

## 2017-04-11 ENCOUNTER — Ambulatory Visit (INDEPENDENT_AMBULATORY_CARE_PROVIDER_SITE_OTHER): Payer: Medicare Other | Admitting: *Deleted

## 2017-04-11 DIAGNOSIS — I4891 Unspecified atrial fibrillation: Secondary | ICD-10-CM | POA: Diagnosis not present

## 2017-04-11 DIAGNOSIS — Z5181 Encounter for therapeutic drug level monitoring: Secondary | ICD-10-CM

## 2017-04-11 LAB — POCT INR: INR: 3.2

## 2017-04-17 DIAGNOSIS — Z23 Encounter for immunization: Secondary | ICD-10-CM | POA: Diagnosis not present

## 2017-04-21 ENCOUNTER — Telehealth: Payer: Self-pay | Admitting: Cardiology

## 2017-04-21 DIAGNOSIS — E119 Type 2 diabetes mellitus without complications: Secondary | ICD-10-CM | POA: Diagnosis not present

## 2017-04-21 DIAGNOSIS — E782 Mixed hyperlipidemia: Secondary | ICD-10-CM | POA: Diagnosis not present

## 2017-04-21 DIAGNOSIS — I482 Chronic atrial fibrillation: Secondary | ICD-10-CM | POA: Diagnosis not present

## 2017-04-21 DIAGNOSIS — N400 Enlarged prostate without lower urinary tract symptoms: Secondary | ICD-10-CM | POA: Diagnosis not present

## 2017-04-21 DIAGNOSIS — I10 Essential (primary) hypertension: Secondary | ICD-10-CM | POA: Diagnosis not present

## 2017-04-21 DIAGNOSIS — M1990 Unspecified osteoarthritis, unspecified site: Secondary | ICD-10-CM | POA: Diagnosis not present

## 2017-04-21 NOTE — Telephone Encounter (Signed)
[place] Pharmacist at [facility] called in regards to this pt's WARFARIN 5mg .  Pt requested a change to some thing that donot have a blood test's.

## 2017-04-21 NOTE — Telephone Encounter (Signed)
Returned call to [place] - pt mentioned potentially wanting to switch to a DOAC. Do not see any contraindications to this, can discuss with pt at his next INR check on 10/9.   [facility] labs from 07/2016: SCr 0.84, they will fax over BMET results.  Pt also has not seen a cardiologist since 2015. Will have pt schedule f/u with Dr Marlou Porch when he comes in for next Coumadin check.

## 2017-05-02 ENCOUNTER — Ambulatory Visit (INDEPENDENT_AMBULATORY_CARE_PROVIDER_SITE_OTHER): Payer: Medicare Other | Admitting: *Deleted

## 2017-05-02 DIAGNOSIS — Z5181 Encounter for therapeutic drug level monitoring: Secondary | ICD-10-CM | POA: Diagnosis not present

## 2017-05-02 DIAGNOSIS — I4891 Unspecified atrial fibrillation: Secondary | ICD-10-CM

## 2017-05-02 LAB — POCT INR: INR: 2.1

## 2017-06-05 ENCOUNTER — Encounter: Payer: Self-pay | Admitting: Cardiology

## 2017-06-13 ENCOUNTER — Ambulatory Visit (INDEPENDENT_AMBULATORY_CARE_PROVIDER_SITE_OTHER): Payer: Medicare Other | Admitting: Pharmacist

## 2017-06-13 DIAGNOSIS — I4891 Unspecified atrial fibrillation: Secondary | ICD-10-CM

## 2017-06-13 DIAGNOSIS — Z5181 Encounter for therapeutic drug level monitoring: Secondary | ICD-10-CM | POA: Diagnosis not present

## 2017-06-13 LAB — POCT INR: INR: 2.6

## 2017-06-13 NOTE — Patient Instructions (Signed)
Continue taking 1/2 tablet everyday. Recheck INR in 4 weeks. [facility] [phone]

## 2017-06-19 DIAGNOSIS — J069 Acute upper respiratory infection, unspecified: Secondary | ICD-10-CM | POA: Diagnosis not present

## 2017-06-19 DIAGNOSIS — K648 Other hemorrhoids: Secondary | ICD-10-CM | POA: Diagnosis not present

## 2017-06-20 ENCOUNTER — Ambulatory Visit (INDEPENDENT_AMBULATORY_CARE_PROVIDER_SITE_OTHER): Payer: Medicare Other | Admitting: Cardiology

## 2017-06-20 ENCOUNTER — Encounter: Payer: Self-pay | Admitting: Cardiology

## 2017-06-20 VITALS — BP 120/86 | HR 70 | Ht 67.0 in | Wt 158.4 lb

## 2017-06-20 DIAGNOSIS — I10 Essential (primary) hypertension: Secondary | ICD-10-CM

## 2017-06-20 DIAGNOSIS — I4891 Unspecified atrial fibrillation: Secondary | ICD-10-CM

## 2017-06-20 DIAGNOSIS — Z5181 Encounter for therapeutic drug level monitoring: Secondary | ICD-10-CM | POA: Diagnosis not present

## 2017-06-20 DIAGNOSIS — E119 Type 2 diabetes mellitus without complications: Secondary | ICD-10-CM

## 2017-06-20 MED ORDER — RIVAROXABAN 20 MG PO TABS: 20.0000 mg | ORAL_TABLET | Freq: Every day | ORAL | 11 refills | Status: DC

## 2017-06-20 NOTE — Patient Instructions (Signed)
Medication Instructions:  Please discontinue Warfarin (Coumadin) and start Xarelto 20 mg a day with your largest meal of the day. Continue all other medications as listed.  Follow-Up: Follow up in 6 months with Dr. Marlou Porch.  You will receive a letter in the mail 2 months before you are due.  Please call us when you receive this letter to schedule your follow up appointment.  If you need a refill on your cardiac medications before your next appointment, please call your pharmacy.  Thank you for choosing [facility]!!

## 2017-06-20 NOTE — Progress Notes (Signed)
EKG

## 2017-06-20 NOTE — Progress Notes (Signed)
Robert Irwin. [address] [place], [place]  [address] Phone: [phone] Fax:  [phone]  Date:  06/20/2017   ID:  Worth, Kober 07-05-1941, MRN [id number]  PCP:  Robert Low, MD   History of Present Illness: Robert Irwin is a 76 y.o. male with atrial fibrillation, well rate controlled on chronic anticoagulation, diabetes, hyperlipidemia .  It has been over 3 years since his last evaluation.   He is currently doing very well. Diltiazem. Irwin risk nuclear stress test in November 2008. Mildly dilated left atrium. Normal EF on original echocardiogram.  Denies any bleeding syncope orthopnea PND chest pain tremors muscle aches.  He is interested in transitioning over to Xarelto.  He has been quite stable on his Coumadin.  Hyperlipidemia, Lipitor, fish oil.   Diabetes, excellent job.  Dr. Lysle Rubens.   Alcohol use is noted, occasional scotch.      Wt Readings from Last 3 Encounters:  06/20/17 158 lb 6.4 oz (71.8 kg)  03/19/14 156 lb (70.8 kg)  12/17/11 149 lb 1.6 oz (67.6 kg)     Past Medical History:  Diagnosis Date  . A-fib ([facility])   . Atrial fibrillation ([facility])   . Diabetes mellitus   . ED (erectile dysfunction)   . Epistaxis   . Hyperlipidemia   . Hypertension     Past Surgical History:  Procedure Laterality Date  . NASAL MASS EXCISION    . NASAL SINUS SURGERY  12/16/2011   Procedure: ENDOSCOPIC SINUS SURGERY;  Surgeon: Rozetta Nunnery, MD;  Location: [facility];  Service: ENT;  Laterality: N/A;  for control of epistaxis    Current Outpatient Medications  Medication Sig Dispense Refill  . atorvastatin (LIPITOR) 20 MG tablet Take 20 mg by mouth daily.    . calcium citrate-vitamin D 200-200 MG-UNIT TABS Take 1 tablet by mouth daily.    Marland Kitchen diltiazem (TIAZAC) 240 MG 24 hr capsule Take 240 mg by mouth daily.    . fish oil-omega-3 fatty acids 1000 MG capsule Take 2 g by mouth daily.    . fluocinonide ointment (LIDEX) 7.32 % Apply 1 application topically 2 (two)  times daily.    . fluticasone (FLONASE) 50 MCG/ACT nasal spray Place 1 spray into both nostrils daily.    Marland Kitchen glimepiride (AMARYL) 2 MG tablet Take 1 mg by mouth daily before breakfast.    . GLUCOSAMINE PO Take 1 tablet by mouth daily. Hold while in hospital    . lisinopril (PRINIVIL,ZESTRIL) 2.5 MG tablet Take 2.5 mg by mouth daily.    . Multiple Vitamin (MULITIVITAMIN WITH MINERALS) TABS Take 1 tablet by mouth daily.    . rivaroxaban (XARELTO) 20 MG TABS tablet Take 1 tablet (20 mg total) by mouth daily with supper. 30 tablet 11   No current facility-administered medications for this visit.     Allergies:   No Known Allergies  Social History:  The patient  reports that he quit smoking about 26 years ago. he has never used smokeless tobacco. He reports that he drinks alcohol. He reports that he does not use drugs.   History reviewed. No pertinent family history.  ROS:  Please see the history of present illness.   Denies any bleeding, syncope, orthopnea, PND all other review of systems negative PHYSICAL EXAM: VS:  BP 120/86   Pulse 70   Ht 5\' 7"  (1.702 m)   Wt 158 lb 6.4 oz (71.8 kg)   SpO2 97%  BMI 24.81 kg/m  GEN: Well nourished, well developed, in no acute distress  HEENT: normal  Neck: no JVD, carotid bruits, or masses Cardiac: irreg irreg; no murmurs, rubs, or gallops,no edema  Respiratory:  clear to auscultation bilaterally, normal work of breathing GI: soft, nontender, nondistended, + BS MS: no deformity or atrophy  Skin: warm and dry, no rash Neuro:  Alert and Oriented x 3, Strength and sensation are intact Psych: euthymic mood, full affect   EKG: 06/20/17-atrial fibrillation 103, old septal infarct pattern personally viewed - prior 03/19/14-atrial fibrillation heart rate 81 beats per minute, no other ST segment changes     LDL: 82, hemoglobin A1c 6.2, hemoglobin 14, creatinine 0.8, estimated creatinine clearance 71  ASSESSMENT AND PLAN:  1. Permanent atrial  fibrillation-has been feeling well, with very well rate controlled over the last several years.  He wishes to switch over to Xarelto.  We discussed at length, bleeding risks.  Had pharmacy check his INR which currently is 2.5.  He will go ahead and take his first dose tonight. 2. Chronic anticoagulation-transitioning from Coumadin to Xarelto.  In 1 month we will have him come back in to see the pharmacist to make sure things are going well.  Therapeutic today. Appreciate pharmacy assistance. 3. Hyperlipidemia-LDL 82 as above. Atorvastatin. Excellent.  No changes made, no myalgias. 4. Diabetes-well controlled, Dr. Lysle Rubens.  Last hemoglobin A1c 6.2.  He is on lisinopril for diabetes as well. 5. Hypertension-excellent control. Irwin-dose ACE inhibitor, diabetes.  No changes made. 6. We will have him come back in in 6 months.  Signed, Candee Furbish, MD [facility]  06/20/2017 9:12 AM

## 2017-07-21 ENCOUNTER — Encounter (INDEPENDENT_AMBULATORY_CARE_PROVIDER_SITE_OTHER): Payer: Self-pay

## 2017-07-21 ENCOUNTER — Ambulatory Visit (INDEPENDENT_AMBULATORY_CARE_PROVIDER_SITE_OTHER): Payer: Medicare Other | Admitting: *Deleted

## 2017-07-21 DIAGNOSIS — Z5181 Encounter for therapeutic drug level monitoring: Secondary | ICD-10-CM

## 2017-07-21 DIAGNOSIS — I4891 Unspecified atrial fibrillation: Secondary | ICD-10-CM

## 2017-07-21 MED ORDER — RIVAROXABAN 20 MG PO TABS: 20.0000 mg | ORAL_TABLET | Freq: Every day | ORAL | 11 refills | Status: DC

## 2017-07-21 NOTE — Progress Notes (Signed)
Pt was started on Xarelto 20mg  daily for AFIB on 06/20/17 by Dr. Marlou Porch.    Reviewed patients medication list.  Pt is not currently on any combined P-gp and strong CYP3A4 inhibitors/inducers (ketoconazole, traconazole, ritonavir, carbamazepine, phenytoin, rifampin, St. John's wort).  Reviewed labs: SCr-0.89, Hgb-13.5, HCT-38.9, Weight-71.9kg, CrCl-72.83ml/min.  Dose appropriate based on CrCl.   Hgb and HCT within normal limits.   A full discussion of the nature of anticoagulants has been carried out.  A benefit/risk analysis has been presented to the patient, so that they understand the justification for choosing anticoagulation with Xarelto at this time.  The need for compliance is stressed.  Pt is aware to take the medication once daily with the largest meal of the day.  Side effects of potential bleeding are discussed, including unusual colored urine or stools, coughing up blood or coffee ground emesis, nose bleeds or serious fall or head trauma.  Discussed signs and symptoms of stroke. The patient should avoid any OTC items containing aspirin or ibuprofen.  Avoid alcohol consumption.   Call if any signs of abnormal bleeding.  Discussed financial obligations and resolved any difficulty in obtaining medication.  Next lab test in 6 months.   07/21/17: Pt states he was given a 30 day supply free with the card he took to the pharmacy & he is unsure of the cost. Advised pt that if he is unable to pay for Xarelto that he needs to notify Dr. Marlou Porch & he verbalized understanding. Also, pt stated that he does not want meds sent to [facility] but to [facility] on [address]; pt requested I send meds there & this was done today.   07/24/17: Labs reviewed for Xarelto f/u and noted that the pt was called regarding increased potassium level & given instructions, informed pt that the labs used for Xarelto f/u are within normal limits & to continue Xarelto 20mg  daily & he verbalized understanding. Advised pt to call back  with any issues & he verbalized understanding. Also, reminded pt to pick up refill on Xarelto as he has not done so & he verbalized understanding. Appt set with pt over the phone for 6 months for a follow-up appt.

## 2017-07-22 LAB — CBC
HEMATOCRIT: 38.9 % (ref 37.5–51.0)
HEMOGLOBIN: 13.5 g/dL (ref 13.0–17.7)
MCH: 34.4 pg — AB (ref 26.6–33.0)
MCHC: 34.7 g/dL (ref 31.5–35.7)
MCV: 99 fL — AB (ref 79–97)
Platelets: 231 10*3/uL (ref 150–379)
RBC: 3.92 x10E6/uL — ABNORMAL LOW (ref 4.14–5.80)
RDW: 14 % (ref 12.3–15.4)
WBC: 4.4 10*3/uL (ref 3.4–10.8)

## 2017-07-22 LAB — BASIC METABOLIC PANEL
BUN/Creatinine Ratio: 15 (ref 10–24)
BUN: 13 mg/dL (ref 8–27)
CO2: 22 mmol/L (ref 20–29)
CREATININE: 0.89 mg/dL (ref 0.76–1.27)
Calcium: 9.2 mg/dL (ref 8.6–10.2)
Chloride: 103 mmol/L (ref 96–106)
GFR calc Af Amer: 97 mL/min/{1.73_m2} (ref >59)
GFR, EST NON AFRICAN AMERICAN: 84 mL/min/{1.73_m2} (ref >59)
Glucose: 122 mg/dL — ABNORMAL HIGH (ref 65–99)
Potassium: 5.5 mmol/L — ABNORMAL HIGH (ref 3.5–5.2)
SODIUM: 140 mmol/L (ref 134–144)

## 2017-08-01 ENCOUNTER — Other Ambulatory Visit: Payer: Self-pay | Admitting: *Deleted

## 2017-08-01 DIAGNOSIS — E875 Hyperkalemia: Secondary | ICD-10-CM

## 2017-08-01 DIAGNOSIS — Z5181 Encounter for therapeutic drug level monitoring: Secondary | ICD-10-CM

## 2017-08-03 ENCOUNTER — Other Ambulatory Visit: Payer: Medicare HMO | Admitting: *Deleted

## 2017-08-03 DIAGNOSIS — Z5181 Encounter for therapeutic drug level monitoring: Secondary | ICD-10-CM

## 2017-08-03 DIAGNOSIS — E875 Hyperkalemia: Secondary | ICD-10-CM

## 2017-08-03 LAB — BASIC METABOLIC PANEL
BUN / CREAT RATIO: 11 (ref 10–24)
BUN: 10 mg/dL (ref 8–27)
CALCIUM: 9.1 mg/dL (ref 8.6–10.2)
CO2: 25 mmol/L (ref 20–29)
Chloride: 104 mmol/L (ref 96–106)
Creatinine, Ser: 0.87 mg/dL (ref 0.76–1.27)
GFR calc Af Amer: 98 mL/min/{1.73_m2} (ref >59)
GFR calc non Af Amer: 84 mL/min/{1.73_m2} (ref >59)
GLUCOSE: 115 mg/dL — AB (ref 65–99)
Potassium: 5.4 mmol/L — ABNORMAL HIGH (ref 3.5–5.2)
Sodium: 141 mmol/L (ref 134–144)

## 2017-08-04 ENCOUNTER — Telehealth: Payer: Self-pay

## 2017-08-04 DIAGNOSIS — E785 Hyperlipidemia, unspecified: Secondary | ICD-10-CM

## 2017-08-04 NOTE — Telephone Encounter (Signed)
spoke with patient about recent lab results. patient verbalized understanding. repeat bmet appt made for 08/18/17 at 10am. patient thanked me for my call.

## 2017-08-18 ENCOUNTER — Other Ambulatory Visit: Payer: Medicare HMO

## 2017-08-18 DIAGNOSIS — E782 Mixed hyperlipidemia: Secondary | ICD-10-CM | POA: Diagnosis not present

## 2017-08-18 DIAGNOSIS — M1990 Unspecified osteoarthritis, unspecified site: Secondary | ICD-10-CM | POA: Diagnosis not present

## 2017-08-18 DIAGNOSIS — E119 Type 2 diabetes mellitus without complications: Secondary | ICD-10-CM | POA: Diagnosis not present

## 2017-08-18 DIAGNOSIS — Z0000 Encounter for general adult medical examination without abnormal findings: Secondary | ICD-10-CM | POA: Diagnosis not present

## 2017-08-18 DIAGNOSIS — Z7984 Long term (current) use of oral hypoglycemic drugs: Secondary | ICD-10-CM | POA: Diagnosis not present

## 2017-08-18 DIAGNOSIS — N400 Enlarged prostate without lower urinary tract symptoms: Secondary | ICD-10-CM | POA: Diagnosis not present

## 2017-08-18 DIAGNOSIS — I482 Chronic atrial fibrillation: Secondary | ICD-10-CM | POA: Diagnosis not present

## 2017-08-18 DIAGNOSIS — Z1389 Encounter for screening for other disorder: Secondary | ICD-10-CM | POA: Diagnosis not present

## 2017-08-18 DIAGNOSIS — M109 Gout, unspecified: Secondary | ICD-10-CM | POA: Diagnosis not present

## 2017-08-18 DIAGNOSIS — I10 Essential (primary) hypertension: Secondary | ICD-10-CM | POA: Diagnosis not present

## 2017-08-18 DIAGNOSIS — M519 Unspecified thoracic, thoracolumbar and lumbosacral intervertebral disc disorder: Secondary | ICD-10-CM | POA: Diagnosis not present

## 2017-09-11 DIAGNOSIS — J069 Acute upper respiratory infection, unspecified: Secondary | ICD-10-CM | POA: Diagnosis not present

## 2017-11-14 DIAGNOSIS — E119 Type 2 diabetes mellitus without complications: Secondary | ICD-10-CM | POA: Diagnosis not present

## 2017-11-14 DIAGNOSIS — I10 Essential (primary) hypertension: Secondary | ICD-10-CM | POA: Diagnosis not present

## 2017-11-14 DIAGNOSIS — I482 Chronic atrial fibrillation: Secondary | ICD-10-CM | POA: Diagnosis not present

## 2017-11-14 DIAGNOSIS — N400 Enlarged prostate without lower urinary tract symptoms: Secondary | ICD-10-CM | POA: Diagnosis not present

## 2017-11-14 DIAGNOSIS — M1990 Unspecified osteoarthritis, unspecified site: Secondary | ICD-10-CM | POA: Diagnosis not present

## 2017-11-14 DIAGNOSIS — E782 Mixed hyperlipidemia: Secondary | ICD-10-CM | POA: Diagnosis not present

## 2017-11-14 DIAGNOSIS — Z7984 Long term (current) use of oral hypoglycemic drugs: Secondary | ICD-10-CM | POA: Diagnosis not present

## 2017-12-05 NOTE — Progress Notes (Signed)
[facility]. [address], [place]  [address] Phone: [phone] Fax:  [phone]  Date:  12/11/2017   ID:  Robert Irwin, Robert Irwin 1940-09-20, MRN [id number]  PCP:  Wenda Low, MD   History of Present Illness: Robert Irwin is a 77 y.o. male with atrial fibrillation, well rate controlled on chronic anticoagulation, diabetes, hyperlipidemia here for follow-up.  Low risk nuclear stress test in November 2008. Mildly dilated left atrium. Normal EF on original echocardiogram.   Hyperlipidemia, Lipitor, fish oil.   Diabetes, excellent job.  Hemoglobin A1c 6.2 Dr. Lysle Rubens.   Alcohol use is noted, occasional scotch.  Chronic anticoagulation- platelet count 231, hemoglobin 13.3, creatinine 0.8  Overall continues to do very well.  Enjoys not having to go to the Coumadin clinic all of the time.  No bleeding, no syncope, no orthopnea, no PND.  No chest pain.  Wt Readings from Last 3 Encounters:  12/11/17 155 lb 6 oz (70.5 kg)  06/20/17 158 lb 6.4 oz (71.8 kg)  03/19/14 156 lb (70.8 kg)     Past Medical History:  Diagnosis Date  . A-fib ([place])   . Atrial fibrillation ([place])   . Diabetes mellitus   . ED (erectile dysfunction)   . Epistaxis   . Hyperlipidemia   . Hypertension     Past Surgical History:  Procedure Laterality Date  . NASAL MASS EXCISION    . NASAL SINUS SURGERY  12/16/2011   Procedure: ENDOSCOPIC SINUS SURGERY;  Surgeon: Rozetta Nunnery, MD;  Location: [facility];  Service: ENT;  Laterality: N/A;  for control of epistaxis    Current Outpatient Medications  Medication Sig Dispense Refill  . atorvastatin (LIPITOR) 20 MG tablet Take 20 mg by mouth daily.    . calcium citrate-vitamin D 200-200 MG-UNIT TABS Take 1 tablet by mouth daily.    Marland Kitchen diltiazem (TIAZAC) 240 MG 24 hr capsule Take 240 mg by mouth daily.    . fish oil-omega-3 fatty acids 1000 MG capsule Take 2 g by mouth daily.    . fluocinonide ointment (LIDEX) 5.28 % Apply 1 application topically 2  (two) times daily.    . fluticasone (FLONASE) 50 MCG/ACT nasal spray Place 1 spray into both nostrils daily.    Marland Kitchen glimepiride (AMARYL) 2 MG tablet Take 1 mg by mouth daily before breakfast.    . GLUCOSAMINE PO Take 1 tablet by mouth daily. Hold while in hospital    . lisinopril (PRINIVIL,ZESTRIL) 2.5 MG tablet Take 2.5 mg by mouth daily.    . Multiple Vitamin (MULITIVITAMIN WITH MINERALS) TABS Take 1 tablet by mouth daily.    . rivaroxaban (XARELTO) 20 MG TABS tablet Take 1 tablet (20 mg total) by mouth daily with supper. 90 tablet 3   No current facility-administered medications for this visit.     Allergies:   No Known Allergies  Social History:  The patient  reports that he quit smoking about 27 years ago. He has never used smokeless tobacco. He reports that he drinks alcohol. He reports that he does not use drugs.   No family history on file.  ROS:  Please see the history of present illness.   Denies any bleeding, syncope, orthopnea, PND all other review of systems negative  PHYSICAL EXAM: VS:  BP (!) 149/73 (BP Location: Left Arm, Patient Position: Sitting, Cuff Size: Normal)   Pulse (!) 55   Ht 5\' 7"  (1.702 m)   Wt 155 lb 6  oz (70.5 kg)   BMI 24.34 kg/m  GEN: Well nourished, well developed, in no acute distress  HEENT: normal  Neck: no JVD, carotid bruits, or masses Cardiac: Irregular; no murmurs, rubs, or gallops,no edema  Respiratory:  clear to auscultation bilaterally, normal work of breathing GI: soft, nontender, nondistended, + BS MS: no deformity or atrophy  Skin: warm and dry, no rash Neuro:  Alert and Oriented x 3, Strength and sensation are intact Psych: euthymic mood, full affect  EKG: 06/20/17-atrial fibrillation 28, old septal infarct pattern personally viewed - prior 03/19/14-atrial fibrillation heart rate 81 beats per minute, no other ST segment changes     Creatinine clearance 67.5  ASSESSMENT AND PLAN:  1. Permanent atrial fibrillation-no issues.  Doing  well with rate control.  He is asymptomatic.  We changed him over from Coumadin to Xarelto.  This was done at previous visit.  No bleeding, no syncope.  Continue to monitor his blood work, hemoglobin and creatinine normal. 2. hyperlipidemia-LDL 82 as above. Atorvastatin. Excellent.  No changes made, no myalgias. 3. Diabetes type 2 with hypertension-blood pressure was a little bit elevated today usually it is under good control.  Continue to monitor.  Dr. Lysle Rubens.  Last hemoglobin A1c 6.2.  He is on lisinopril for diabetes as well.  Overall doing quite well. 4. We will have him come back in in 12 months.  Signed, Candee Furbish, MD [facility]  12/11/2017 8:24 AM

## 2017-12-11 ENCOUNTER — Ambulatory Visit: Payer: Medicare HMO | Admitting: Cardiology

## 2017-12-11 ENCOUNTER — Encounter: Payer: Self-pay | Admitting: Cardiology

## 2017-12-11 VITALS — BP 149/73 | HR 55 | Ht 67.0 in | Wt 155.4 lb

## 2017-12-11 DIAGNOSIS — E785 Hyperlipidemia, unspecified: Secondary | ICD-10-CM

## 2017-12-11 DIAGNOSIS — I482 Chronic atrial fibrillation: Secondary | ICD-10-CM | POA: Diagnosis not present

## 2017-12-11 DIAGNOSIS — I10 Essential (primary) hypertension: Secondary | ICD-10-CM | POA: Diagnosis not present

## 2017-12-11 DIAGNOSIS — I4821 Permanent atrial fibrillation: Secondary | ICD-10-CM

## 2017-12-11 DIAGNOSIS — E119 Type 2 diabetes mellitus without complications: Secondary | ICD-10-CM | POA: Diagnosis not present

## 2017-12-11 MED ORDER — RIVAROXABAN 20 MG PO TABS: 20.0000 mg | ORAL_TABLET | Freq: Every day | ORAL | 3 refills | Status: DC

## 2017-12-11 NOTE — Patient Instructions (Signed)

## 2017-12-29 DIAGNOSIS — E119 Type 2 diabetes mellitus without complications: Secondary | ICD-10-CM | POA: Diagnosis not present

## 2017-12-29 DIAGNOSIS — H353111 Nonexudative age-related macular degeneration, right eye, early dry stage: Secondary | ICD-10-CM | POA: Diagnosis not present

## 2017-12-29 DIAGNOSIS — H40023 Open angle with borderline findings, high risk, bilateral: Secondary | ICD-10-CM | POA: Diagnosis not present

## 2017-12-29 DIAGNOSIS — H353123 Nonexudative age-related macular degeneration, left eye, advanced atrophic without subfoveal involvement: Secondary | ICD-10-CM | POA: Diagnosis not present

## 2018-01-15 ENCOUNTER — Ambulatory Visit (INDEPENDENT_AMBULATORY_CARE_PROVIDER_SITE_OTHER): Payer: Medicare HMO | Admitting: Pharmacist

## 2018-01-15 VITALS — Wt 156.4 lb

## 2018-01-15 DIAGNOSIS — I4891 Unspecified atrial fibrillation: Secondary | ICD-10-CM | POA: Diagnosis not present

## 2018-01-15 LAB — CBC
HEMATOCRIT: 38.4 % (ref 37.5–51.0)
HEMOGLOBIN: 13.2 g/dL (ref 13.0–17.7)
MCH: 33.2 pg — ABNORMAL HIGH (ref 26.6–33.0)
MCHC: 34.4 g/dL (ref 31.5–35.7)
MCV: 97 fL (ref 79–97)
Platelets: 236 10*3/uL (ref 150–450)
RBC: 3.98 x10E6/uL — ABNORMAL LOW (ref 4.14–5.80)
RDW: 13 % (ref 12.3–15.4)
WBC: 6 10*3/uL (ref 3.4–10.8)

## 2018-01-15 LAB — BASIC METABOLIC PANEL
BUN / CREAT RATIO: 11 (ref 10–24)
BUN: 10 mg/dL (ref 8–27)
CALCIUM: 9.1 mg/dL (ref 8.6–10.2)
CHLORIDE: 103 mmol/L (ref 96–106)
CO2: 24 mmol/L (ref 20–29)
CREATININE: 0.9 mg/dL (ref 0.76–1.27)
GFR calc non Af Amer: 83 mL/min/{1.73_m2} (ref >59)
GFR, EST AFRICAN AMERICAN: 96 mL/min/{1.73_m2} (ref >59)
Glucose: 114 mg/dL — ABNORMAL HIGH (ref 65–99)
Potassium: 4.2 mmol/L (ref 3.5–5.2)
Sodium: 139 mmol/L (ref 134–144)

## 2018-01-15 NOTE — Progress Notes (Signed)
Pt was started on Xarelto for atrial fibrillation on 06/20/2017.  Reviewed patients medication list.  Pt is not currently on any combined P-gp and strong CYP3A4 inhibitors/inducers (ketoconazole, traconazole, ritonavir, carbamazepine, phenytoin, rifampin, St. John's wort).  Reviewed labs.  SCr 0.9, Weight 156 lbs, CrCl 70.  Dose appropriate based on CrCl.   Hgb and HCT WNL.  A full discussion of the nature of anticoagulants has been carried out.  A benefit/risk analysis has been presented to the patient, so that they understand the justification for choosing anticoagulation with Xarelto at this time.  The need for compliance is stressed.  Pt is aware to take the medication once daily with the largest meal of the day.  Side effects of potential bleeding are discussed, including unusual colored urine or stools, coughing up blood or coffee ground emesis, nose bleeds or serious fall or head trauma.  Discussed signs and symptoms of stroke. The patient should avoid any OTC items containing aspirin or ibuprofen.  Avoid alcohol consumption.   Call if any signs of abnormal bleeding.  Discussed financial obligations and resolved any difficulty in obtaining medication.  Next lab test in 6 months.

## 2018-01-16 NOTE — Progress Notes (Signed)
Spoke with pt and made aware of results.

## 2018-01-17 ENCOUNTER — Encounter: Payer: Self-pay | Admitting: *Deleted

## 2018-02-26 DIAGNOSIS — M109 Gout, unspecified: Secondary | ICD-10-CM | POA: Diagnosis not present

## 2018-02-26 DIAGNOSIS — I10 Essential (primary) hypertension: Secondary | ICD-10-CM | POA: Diagnosis not present

## 2018-02-26 DIAGNOSIS — E1169 Type 2 diabetes mellitus with other specified complication: Secondary | ICD-10-CM | POA: Diagnosis not present

## 2018-02-26 DIAGNOSIS — K648 Other hemorrhoids: Secondary | ICD-10-CM | POA: Diagnosis not present

## 2018-02-26 DIAGNOSIS — I482 Chronic atrial fibrillation: Secondary | ICD-10-CM | POA: Diagnosis not present

## 2018-04-13 DIAGNOSIS — R05 Cough: Secondary | ICD-10-CM | POA: Diagnosis not present

## 2018-04-13 DIAGNOSIS — Z23 Encounter for immunization: Secondary | ICD-10-CM | POA: Diagnosis not present

## 2018-07-31 DIAGNOSIS — K625 Hemorrhage of anus and rectum: Secondary | ICD-10-CM | POA: Diagnosis not present

## 2018-07-31 DIAGNOSIS — K648 Other hemorrhoids: Secondary | ICD-10-CM | POA: Diagnosis not present

## 2018-08-03 DIAGNOSIS — H40023 Open angle with borderline findings, high risk, bilateral: Secondary | ICD-10-CM | POA: Diagnosis not present

## 2018-08-21 DIAGNOSIS — K642 Third degree hemorrhoids: Secondary | ICD-10-CM | POA: Diagnosis not present

## 2018-09-24 DIAGNOSIS — K642 Third degree hemorrhoids: Secondary | ICD-10-CM | POA: Diagnosis not present

## 2018-09-27 DIAGNOSIS — Z0000 Encounter for general adult medical examination without abnormal findings: Secondary | ICD-10-CM | POA: Diagnosis not present

## 2018-09-27 DIAGNOSIS — M109 Gout, unspecified: Secondary | ICD-10-CM | POA: Diagnosis not present

## 2018-09-27 DIAGNOSIS — E1169 Type 2 diabetes mellitus with other specified complication: Secondary | ICD-10-CM | POA: Diagnosis not present

## 2018-09-27 DIAGNOSIS — I482 Chronic atrial fibrillation, unspecified: Secondary | ICD-10-CM | POA: Diagnosis not present

## 2018-09-27 DIAGNOSIS — Z125 Encounter for screening for malignant neoplasm of prostate: Secondary | ICD-10-CM | POA: Diagnosis not present

## 2018-09-27 DIAGNOSIS — Z1389 Encounter for screening for other disorder: Secondary | ICD-10-CM | POA: Diagnosis not present

## 2018-09-27 DIAGNOSIS — E782 Mixed hyperlipidemia: Secondary | ICD-10-CM | POA: Diagnosis not present

## 2018-09-27 DIAGNOSIS — I10 Essential (primary) hypertension: Secondary | ICD-10-CM | POA: Diagnosis not present

## 2018-09-27 DIAGNOSIS — Z7984 Long term (current) use of oral hypoglycemic drugs: Secondary | ICD-10-CM | POA: Diagnosis not present

## 2018-12-04 ENCOUNTER — Other Ambulatory Visit: Payer: Self-pay | Admitting: Cardiology

## 2018-12-04 MED ORDER — RIVAROXABAN 20 MG PO TABS: 20.0000 mg | ORAL_TABLET | Freq: Every day | ORAL | 0 refills | Status: DC

## 2018-12-04 MED ORDER — ATORVASTATIN CALCIUM 20 MG PO TABS: 20.0000 mg | ORAL_TABLET | Freq: Every day | ORAL | 0 refills | Status: DC

## 2018-12-04 NOTE — Telephone Encounter (Signed)
Pt calling requesting a refill on atorvastatin and Xarelto, sent to [facility] mail order pharmacy, 90 day supply, pt has an upcoming appt with Dr. Marlou Porch 12/11/18. Would Dr. Marlou Porch like to refill atorvastatin? Please address

## 2018-12-11 ENCOUNTER — Encounter: Payer: Self-pay | Admitting: Cardiology

## 2018-12-11 ENCOUNTER — Ambulatory Visit (INDEPENDENT_AMBULATORY_CARE_PROVIDER_SITE_OTHER): Payer: Medicare HMO | Admitting: Cardiology

## 2018-12-11 ENCOUNTER — Other Ambulatory Visit: Payer: Self-pay

## 2018-12-11 VITALS — BP 124/70 | HR 73 | Ht 67.0 in | Wt 159.8 lb

## 2018-12-11 DIAGNOSIS — I4891 Unspecified atrial fibrillation: Secondary | ICD-10-CM | POA: Diagnosis not present

## 2018-12-11 DIAGNOSIS — I10 Essential (primary) hypertension: Secondary | ICD-10-CM | POA: Diagnosis not present

## 2018-12-11 DIAGNOSIS — I4821 Permanent atrial fibrillation: Secondary | ICD-10-CM | POA: Diagnosis not present

## 2018-12-11 DIAGNOSIS — E119 Type 2 diabetes mellitus without complications: Secondary | ICD-10-CM

## 2018-12-11 DIAGNOSIS — E785 Hyperlipidemia, unspecified: Secondary | ICD-10-CM

## 2018-12-11 MED ORDER — ATORVASTATIN CALCIUM 20 MG PO TABS: 20.0000 mg | ORAL_TABLET | Freq: Every day | ORAL | 3 refills | Status: DC

## 2018-12-11 MED ORDER — RIVAROXABAN 20 MG PO TABS: 20.0000 mg | ORAL_TABLET | Freq: Every day | ORAL | 3 refills | Status: DC

## 2018-12-11 MED ORDER — DILTIAZEM HCL ER BEADS 240 MG PO CP24: 240.0000 mg | ORAL_CAPSULE | Freq: Every day | ORAL | 3 refills | Status: DC

## 2018-12-11 NOTE — Progress Notes (Signed)
Cardiology Office Note:    Date:  12/11/2018   ID:  Robert Irwin, DOB [birth year], MRN [id number]  PCP:  Wenda Low, MD  Cardiologist:  Candee Furbish, MD  Electrophysiologist:  None   Referring MD: Wenda Low, MD     History of Present Illness:    Robert Irwin is a 78 y.o. male with atrial fibrilation permanent, well rate controlled on anticoagulation with diabetes and hyperlipidemia here for follow-up.  Prior hemoglobin A1c 6.4, prior hemoglobin 13 with creatinine of 1.  Doing very well.  Takes atorvastatin without any myalgias.  Previously had a low risk nuclear stress test in 2008 with normal EF on original echocardiogram.  No CP, no SOB, no palps, no bleeding. 2 months, hemorrhoid band.  Enjoying golf.  Maintaining social distancing.    Past Medical History:  Diagnosis Date  . A-fib ([place])   . Atrial fibrillation ([place])   . Diabetes mellitus   . ED (erectile dysfunction)   . Epistaxis   . Hyperlipidemia   . Hypertension     Past Surgical History:  Procedure Laterality Date  . NASAL MASS EXCISION    . NASAL SINUS SURGERY  12/16/2011   Procedure: ENDOSCOPIC SINUS SURGERY;  Surgeon: Rozetta Nunnery, MD;  Location: [facility];  Service: ENT;  Laterality: N/A;  for control of epistaxis    Current Medications: Current Meds  Medication Sig  . atorvastatin (LIPITOR) 20 MG tablet Take 1 tablet (20 mg total) by mouth daily.  . calcium citrate-vitamin D 200-200 MG-UNIT TABS Take 1 tablet by mouth daily.  Marland Kitchen COLCRYS 0.6 MG tablet Take 0.6 mg by mouth as needed.  . diltiazem (TIAZAC) 240 MG 24 hr capsule Take 1 capsule (240 mg total) by mouth daily.  . fish oil-omega-3 fatty acids 1000 MG capsule Take 2 g by mouth daily.  Marland Kitchen glimepiride (AMARYL) 2 MG tablet Take 1 mg by mouth daily before breakfast.  . Multiple Vitamin (MULITIVITAMIN WITH MINERALS) TABS Take 1 tablet by mouth daily.  . rivaroxaban (XARELTO) 20 MG TABS tablet Take 1 tablet (20 mg total) by mouth daily with  supper.  . [DISCONTINUED] atorvastatin (LIPITOR) 20 MG tablet Take 1 tablet (20 mg total) by mouth daily.  . [DISCONTINUED] diltiazem (TIAZAC) 240 MG 24 hr capsule Take 240 mg by mouth daily.  . [DISCONTINUED] rivaroxaban (XARELTO) 20 MG TABS tablet Take 1 tablet (20 mg total) by mouth daily with supper.     Allergies:   Patient has no known allergies.   Social History   Socioeconomic History  . Marital status: Married    Spouse name: Not on file  . Number of children: Not on file  . Years of education: Not on file  . Highest education level: Not on file  Occupational History  . Not on file  Social Needs  . Financial resource strain: Not on file  . Food insecurity:    Worry: Not on file    Inability: Not on file  . Transportation needs:    Medical: Not on file    Non-medical: Not on file  Tobacco Use  . Smoking status: Former Smoker    Last attempt to quit: 07/25/1990    Years since quitting: 28.4  . Smokeless tobacco: Never Used  Substance and Sexual Activity  . Alcohol use: Yes  . Drug use: No  . Sexual activity: Not on file  Lifestyle  . Physical activity:    Days per week: Not on file  Minutes per session: Not on file  . Stress: Not on file  Relationships  . Social connections:    Talks on phone: Not on file    Gets together: Not on file    Attends religious service: Not on file    Active member of club or organization: Not on file    Attends meetings of clubs or organizations: Not on file    Relationship status: Not on file  Other Topics Concern  . Not on file  Social History Narrative  . Not on file     Family History: The patient's family history is not on file.  No early family history of coronary disease  ROS:   Please see the history of present illness.     All other systems reviewed and are negative.  EKGs/Labs/Other Studies Reviewed:    The following studies were reviewed today: As above  EKG:  EKG is  ordered today.  The ekg ordered today  demonstrates atrial fibrillation 73 septal infarct pattern no other changes personally reviewed  Recent Labs: 01/15/2018: BUN 10; Creatinine, Ser 0.90; Hemoglobin 13.2; Platelets 236; Potassium 4.2; Sodium 139  Recent Lipid Panel No results found for: CHOL, TRIG, HDL, CHOLHDL, VLDL, LDLCALC, LDLDIRECT  Physical Exam:    VS:  BP 124/70   Pulse 73   Ht 5\' 7"  (1.702 m)   Wt 159 lb 12.8 oz (72.5 kg)   BMI 25.03 kg/m     Wt Readings from Last 3 Encounters:  12/11/18 159 lb 12.8 oz (72.5 kg)  01/15/18 156 lb 6.4 oz (70.9 kg)  12/11/17 155 lb 6 oz (70.5 kg)     GEN:  Well nourished, well developed in no acute distress HEENT: Normal NECK: No JVD; No carotid bruits LYMPHATICS: No lymphadenopathy CARDIAC: irreg, no murmurs, rubs, gallops RESPIRATORY:  Clear to auscultation without rales, wheezing or rhonchi  ABDOMEN: Soft, non-tender, non-distended MUSCULOSKELETAL:  No edema; No deformity  SKIN: Warm and dry NEUROLOGIC:  Alert and oriented x 3 PSYCHIATRIC:  Normal affect   ASSESSMENT:    1. Atrial fibrillation with RVR ([place])   2. Permanent atrial fibrillation   3. Diabetes mellitus with coincident hypertension ([place])   4. Hyperlipidemia, unspecified hyperlipidemia type    PLAN:    In order of problems listed above:  Permanent atrial fibrillation - Has been doing very well with good rate control for several years asymptomatic.  Previously changing from Coumadin over to [place].  Doing well without any bleeding.  Did have a hemorrhoid clipping performed about 2 months ago.  Doing well.  No bleeding.  Hyperlipidemia - Currently well controlled on atorvastatin without any myalgias.  Prior LDL 109.  No changes made.  Diabetes with hyperlipidemia -Well-controlled, Dr. Deforest Hoyles.  Prior hemoglobin A1c 6.4.     Medication Adjustments/Labs and Tests Ordered: Current medicines are reviewed at length with the patient today.  Concerns regarding medicines are outlined above.  Orders  Placed This Encounter  Procedures  . EKG 12-Lead   Meds ordered this encounter  Medications  . atorvastatin (LIPITOR) 20 MG tablet    Sig: Take 1 tablet (20 mg total) by mouth daily.    Dispense:  90 tablet    Refill:  3  . diltiazem (TIAZAC) 240 MG 24 hr capsule    Sig: Take 1 capsule (240 mg total) by mouth daily.    Dispense:  90 capsule    Refill:  3  . rivaroxaban (XARELTO) 20 MG TABS tablet  Sig: Take 1 tablet (20 mg total) by mouth daily with supper.    Dispense:  90 tablet    Refill:  3    Patient Instructions  Medication Instructions:  The current medical regimen is effective;  continue present plan and medications.  If you need a refill on your cardiac medications before your next appointment, please call your pharmacy.   If you have labs (blood work) drawn today and your tests are completely normal, you  Follow-Up: At [facility], you and your health needs are our priority.  As part of our continuing mission to provide you with exceptional heart care, we have created designated Provider Care Teams.  These Care Teams include your primary Cardiologist (physician) and Advanced Practice Providers (APPs -  Physician Assistants and Nurse Practitioners) who all work together to provide you with the care you need, when you need it. You will need a follow up appointment in 12 months.  Please call our office 2 months in advance to schedule this appointment.  You may see Candee Furbish, MD or one of the following Advanced Practice Providers on your designated Care Team:   Truitt Merle, NP Cecilie Kicks, NP . Kathyrn Drown, NP  Thank you for choosing [facility]!!        Signed, Candee Furbish, MD  12/11/2018 9:27 AM    [place]

## 2018-12-11 NOTE — Patient Instructions (Signed)
Medication Instructions:  The current medical regimen is effective;  continue present plan and medications.  If you need a refill on your cardiac medications before your next appointment, please call your pharmacy.   If you have labs (blood work) drawn today and your tests are completely normal, you  Follow-Up: At [facility], you and your health needs are our priority.  As part of our continuing mission to provide you with exceptional heart care, we have created designated Provider Care Teams.  These Care Teams include your primary Cardiologist (physician) and Advanced Practice Providers (APPs -  Physician Assistants and Nurse Practitioners) who all work together to provide you with the care you need, when you need it. You will need a follow up appointment in 12 months.  Please call our office 2 months in advance to schedule this appointment.  You may see Candee Furbish, MD or one of the following Advanced Practice Providers on your designated Care Team:   Truitt Merle, NP Cecilie Kicks, NP . Kathyrn Drown, NP  Thank you for choosing [facility]!!

## 2018-12-27 DIAGNOSIS — M545 Low back pain: Secondary | ICD-10-CM | POA: Diagnosis not present

## 2019-01-16 DIAGNOSIS — N400 Enlarged prostate without lower urinary tract symptoms: Secondary | ICD-10-CM | POA: Diagnosis not present

## 2019-01-16 DIAGNOSIS — E1169 Type 2 diabetes mellitus with other specified complication: Secondary | ICD-10-CM | POA: Diagnosis not present

## 2019-01-16 DIAGNOSIS — I482 Chronic atrial fibrillation, unspecified: Secondary | ICD-10-CM | POA: Diagnosis not present

## 2019-01-16 DIAGNOSIS — I10 Essential (primary) hypertension: Secondary | ICD-10-CM | POA: Diagnosis not present

## 2019-01-16 DIAGNOSIS — E119 Type 2 diabetes mellitus without complications: Secondary | ICD-10-CM | POA: Diagnosis not present

## 2019-01-16 DIAGNOSIS — E782 Mixed hyperlipidemia: Secondary | ICD-10-CM | POA: Diagnosis not present

## 2019-01-16 DIAGNOSIS — M1990 Unspecified osteoarthritis, unspecified site: Secondary | ICD-10-CM | POA: Diagnosis not present

## 2019-01-26 ENCOUNTER — Other Ambulatory Visit: Payer: Self-pay | Admitting: Cardiology

## 2019-01-28 NOTE — Telephone Encounter (Signed)
Xarelto 20mg  refill request received; pt is 78 yrs old, wt-72.5kg, Crea-1.00 on 10/17/2018 via [facility] at [place], last seen by Dr. Marlou Porch on 12/11/2018, [place].86ml/min; will send in refill to requested pharmacy.

## 2019-02-08 DIAGNOSIS — Z961 Presence of intraocular lens: Secondary | ICD-10-CM | POA: Diagnosis not present

## 2019-02-08 DIAGNOSIS — H353111 Nonexudative age-related macular degeneration, right eye, early dry stage: Secondary | ICD-10-CM | POA: Diagnosis not present

## 2019-02-08 DIAGNOSIS — E119 Type 2 diabetes mellitus without complications: Secondary | ICD-10-CM | POA: Diagnosis not present

## 2019-02-08 DIAGNOSIS — H40023 Open angle with borderline findings, high risk, bilateral: Secondary | ICD-10-CM | POA: Diagnosis not present

## 2019-03-29 DIAGNOSIS — I10 Essential (primary) hypertension: Secondary | ICD-10-CM | POA: Diagnosis not present

## 2019-03-29 DIAGNOSIS — I482 Chronic atrial fibrillation, unspecified: Secondary | ICD-10-CM | POA: Diagnosis not present

## 2019-03-29 DIAGNOSIS — Z7984 Long term (current) use of oral hypoglycemic drugs: Secondary | ICD-10-CM | POA: Diagnosis not present

## 2019-03-29 DIAGNOSIS — M109 Gout, unspecified: Secondary | ICD-10-CM | POA: Diagnosis not present

## 2019-03-29 DIAGNOSIS — E782 Mixed hyperlipidemia: Secondary | ICD-10-CM | POA: Diagnosis not present

## 2019-03-29 DIAGNOSIS — E1169 Type 2 diabetes mellitus with other specified complication: Secondary | ICD-10-CM | POA: Diagnosis not present

## 2019-04-04 DIAGNOSIS — Z23 Encounter for immunization: Secondary | ICD-10-CM | POA: Diagnosis not present

## 2019-04-23 DIAGNOSIS — E119 Type 2 diabetes mellitus without complications: Secondary | ICD-10-CM | POA: Diagnosis not present

## 2019-04-23 DIAGNOSIS — I10 Essential (primary) hypertension: Secondary | ICD-10-CM | POA: Diagnosis not present

## 2019-04-23 DIAGNOSIS — N400 Enlarged prostate without lower urinary tract symptoms: Secondary | ICD-10-CM | POA: Diagnosis not present

## 2019-04-23 DIAGNOSIS — I482 Chronic atrial fibrillation, unspecified: Secondary | ICD-10-CM | POA: Diagnosis not present

## 2019-04-23 DIAGNOSIS — E782 Mixed hyperlipidemia: Secondary | ICD-10-CM | POA: Diagnosis not present

## 2019-04-23 DIAGNOSIS — E1169 Type 2 diabetes mellitus with other specified complication: Secondary | ICD-10-CM | POA: Diagnosis not present

## 2019-04-23 DIAGNOSIS — M1990 Unspecified osteoarthritis, unspecified site: Secondary | ICD-10-CM | POA: Diagnosis not present

## 2019-06-28 DIAGNOSIS — I482 Chronic atrial fibrillation, unspecified: Secondary | ICD-10-CM | POA: Diagnosis not present

## 2019-06-28 DIAGNOSIS — E782 Mixed hyperlipidemia: Secondary | ICD-10-CM | POA: Diagnosis not present

## 2019-06-28 DIAGNOSIS — M1990 Unspecified osteoarthritis, unspecified site: Secondary | ICD-10-CM | POA: Diagnosis not present

## 2019-06-28 DIAGNOSIS — N400 Enlarged prostate without lower urinary tract symptoms: Secondary | ICD-10-CM | POA: Diagnosis not present

## 2019-06-28 DIAGNOSIS — I10 Essential (primary) hypertension: Secondary | ICD-10-CM | POA: Diagnosis not present

## 2019-06-28 DIAGNOSIS — E1169 Type 2 diabetes mellitus with other specified complication: Secondary | ICD-10-CM | POA: Diagnosis not present

## 2019-06-28 DIAGNOSIS — E119 Type 2 diabetes mellitus without complications: Secondary | ICD-10-CM | POA: Diagnosis not present

## 2019-08-20 DIAGNOSIS — M1990 Unspecified osteoarthritis, unspecified site: Secondary | ICD-10-CM | POA: Diagnosis not present

## 2019-08-20 DIAGNOSIS — I10 Essential (primary) hypertension: Secondary | ICD-10-CM | POA: Diagnosis not present

## 2019-08-20 DIAGNOSIS — E782 Mixed hyperlipidemia: Secondary | ICD-10-CM | POA: Diagnosis not present

## 2019-08-20 DIAGNOSIS — E1169 Type 2 diabetes mellitus with other specified complication: Secondary | ICD-10-CM | POA: Diagnosis not present

## 2019-08-20 DIAGNOSIS — E119 Type 2 diabetes mellitus without complications: Secondary | ICD-10-CM | POA: Diagnosis not present

## 2019-08-20 DIAGNOSIS — N400 Enlarged prostate without lower urinary tract symptoms: Secondary | ICD-10-CM | POA: Diagnosis not present

## 2019-08-20 DIAGNOSIS — I482 Chronic atrial fibrillation, unspecified: Secondary | ICD-10-CM | POA: Diagnosis not present

## 2019-09-04 ENCOUNTER — Other Ambulatory Visit: Payer: Self-pay | Admitting: Cardiology

## 2019-09-05 NOTE — Telephone Encounter (Addendum)
Prescription refill request for Xarelto received.   Last office visit: 12/11/2018, [place] Weight: 72.5kg Age: 79 y.o. Scr: 1.0, 09/27/2018 CrCl: 63 ml/min   Prescription refill sent.

## 2019-09-06 DIAGNOSIS — M545 Low back pain: Secondary | ICD-10-CM | POA: Diagnosis not present

## 2019-09-16 ENCOUNTER — Other Ambulatory Visit: Payer: Self-pay | Admitting: Cardiology

## 2019-09-18 DIAGNOSIS — N400 Enlarged prostate without lower urinary tract symptoms: Secondary | ICD-10-CM | POA: Diagnosis not present

## 2019-09-18 DIAGNOSIS — E1169 Type 2 diabetes mellitus with other specified complication: Secondary | ICD-10-CM | POA: Diagnosis not present

## 2019-09-18 DIAGNOSIS — M1990 Unspecified osteoarthritis, unspecified site: Secondary | ICD-10-CM | POA: Diagnosis not present

## 2019-09-18 DIAGNOSIS — E119 Type 2 diabetes mellitus without complications: Secondary | ICD-10-CM | POA: Diagnosis not present

## 2019-09-18 DIAGNOSIS — I10 Essential (primary) hypertension: Secondary | ICD-10-CM | POA: Diagnosis not present

## 2019-09-18 DIAGNOSIS — I482 Chronic atrial fibrillation, unspecified: Secondary | ICD-10-CM | POA: Diagnosis not present

## 2019-09-18 DIAGNOSIS — E782 Mixed hyperlipidemia: Secondary | ICD-10-CM | POA: Diagnosis not present

## 2019-10-01 ENCOUNTER — Other Ambulatory Visit: Payer: Self-pay | Admitting: Internal Medicine

## 2019-10-01 ENCOUNTER — Other Ambulatory Visit: Payer: Self-pay

## 2019-10-01 ENCOUNTER — Ambulatory Visit
Admission: RE | Admit: 2019-10-01 | Discharge: 2019-10-01 | Disposition: A | Discharge status: Home / Self Care | Payer: Medicare HMO | Source: Ambulatory Visit | Attending: Internal Medicine | Admitting: Internal Medicine

## 2019-10-01 DIAGNOSIS — N400 Enlarged prostate without lower urinary tract symptoms: Secondary | ICD-10-CM | POA: Diagnosis not present

## 2019-10-01 DIAGNOSIS — M109 Gout, unspecified: Secondary | ICD-10-CM | POA: Diagnosis not present

## 2019-10-01 DIAGNOSIS — Z1389 Encounter for screening for other disorder: Secondary | ICD-10-CM | POA: Diagnosis not present

## 2019-10-01 DIAGNOSIS — M519 Unspecified thoracic, thoracolumbar and lumbosacral intervertebral disc disorder: Secondary | ICD-10-CM | POA: Diagnosis not present

## 2019-10-01 DIAGNOSIS — M545 Low back pain: Secondary | ICD-10-CM | POA: Diagnosis not present

## 2019-10-01 DIAGNOSIS — Z0000 Encounter for general adult medical examination without abnormal findings: Secondary | ICD-10-CM | POA: Diagnosis not present

## 2019-10-01 DIAGNOSIS — I10 Essential (primary) hypertension: Secondary | ICD-10-CM | POA: Diagnosis not present

## 2019-10-01 DIAGNOSIS — E782 Mixed hyperlipidemia: Secondary | ICD-10-CM | POA: Diagnosis not present

## 2019-10-01 DIAGNOSIS — D6869 Other thrombophilia: Secondary | ICD-10-CM | POA: Diagnosis not present

## 2019-10-01 DIAGNOSIS — I482 Chronic atrial fibrillation, unspecified: Secondary | ICD-10-CM | POA: Diagnosis not present

## 2019-10-01 DIAGNOSIS — E1169 Type 2 diabetes mellitus with other specified complication: Secondary | ICD-10-CM | POA: Diagnosis not present

## 2019-10-08 DIAGNOSIS — M545 Low back pain: Secondary | ICD-10-CM | POA: Diagnosis not present

## 2019-10-11 DIAGNOSIS — M545 Low back pain: Secondary | ICD-10-CM | POA: Diagnosis not present

## 2019-10-14 DIAGNOSIS — M545 Low back pain: Secondary | ICD-10-CM | POA: Diagnosis not present

## 2019-10-17 DIAGNOSIS — M1990 Unspecified osteoarthritis, unspecified site: Secondary | ICD-10-CM | POA: Diagnosis not present

## 2019-10-17 DIAGNOSIS — I482 Chronic atrial fibrillation, unspecified: Secondary | ICD-10-CM | POA: Diagnosis not present

## 2019-10-17 DIAGNOSIS — I10 Essential (primary) hypertension: Secondary | ICD-10-CM | POA: Diagnosis not present

## 2019-10-17 DIAGNOSIS — E782 Mixed hyperlipidemia: Secondary | ICD-10-CM | POA: Diagnosis not present

## 2019-10-17 DIAGNOSIS — N400 Enlarged prostate without lower urinary tract symptoms: Secondary | ICD-10-CM | POA: Diagnosis not present

## 2019-10-17 DIAGNOSIS — E1169 Type 2 diabetes mellitus with other specified complication: Secondary | ICD-10-CM | POA: Diagnosis not present

## 2019-10-18 DIAGNOSIS — M545 Low back pain: Secondary | ICD-10-CM | POA: Diagnosis not present

## 2019-10-21 DIAGNOSIS — M545 Low back pain: Secondary | ICD-10-CM | POA: Diagnosis not present

## 2019-10-28 DIAGNOSIS — M545 Low back pain: Secondary | ICD-10-CM | POA: Diagnosis not present

## 2019-10-30 DIAGNOSIS — N400 Enlarged prostate without lower urinary tract symptoms: Secondary | ICD-10-CM | POA: Diagnosis not present

## 2019-10-30 DIAGNOSIS — E782 Mixed hyperlipidemia: Secondary | ICD-10-CM | POA: Diagnosis not present

## 2019-10-30 DIAGNOSIS — I482 Chronic atrial fibrillation, unspecified: Secondary | ICD-10-CM | POA: Diagnosis not present

## 2019-10-30 DIAGNOSIS — E1169 Type 2 diabetes mellitus with other specified complication: Secondary | ICD-10-CM | POA: Diagnosis not present

## 2019-10-30 DIAGNOSIS — I10 Essential (primary) hypertension: Secondary | ICD-10-CM | POA: Diagnosis not present

## 2019-10-30 DIAGNOSIS — M1990 Unspecified osteoarthritis, unspecified site: Secondary | ICD-10-CM | POA: Diagnosis not present

## 2019-11-04 DIAGNOSIS — M545 Low back pain: Secondary | ICD-10-CM | POA: Diagnosis not present

## 2019-12-03 ENCOUNTER — Telehealth: Payer: Self-pay | Admitting: Cardiology

## 2019-12-03 DIAGNOSIS — I10 Essential (primary) hypertension: Secondary | ICD-10-CM | POA: Diagnosis not present

## 2019-12-03 DIAGNOSIS — E782 Mixed hyperlipidemia: Secondary | ICD-10-CM | POA: Diagnosis not present

## 2019-12-03 DIAGNOSIS — M1990 Unspecified osteoarthritis, unspecified site: Secondary | ICD-10-CM | POA: Diagnosis not present

## 2019-12-03 DIAGNOSIS — N400 Enlarged prostate without lower urinary tract symptoms: Secondary | ICD-10-CM | POA: Diagnosis not present

## 2019-12-03 DIAGNOSIS — E1169 Type 2 diabetes mellitus with other specified complication: Secondary | ICD-10-CM | POA: Diagnosis not present

## 2019-12-03 DIAGNOSIS — I482 Chronic atrial fibrillation, unspecified: Secondary | ICD-10-CM | POA: Diagnosis not present

## 2019-12-03 MED ORDER — DILTIAZEM HCL ER BEADS 240 MG PO CP24: 240.0000 mg | ORAL_CAPSULE | Freq: Every day | ORAL | 0 refills | Status: DC

## 2019-12-03 NOTE — Telephone Encounter (Signed)
*  STAT* If patient is at the pharmacy, call can be transferred to refill team.   1. Which medications need to be refilled? (please list name of each medication and dose if known)  diltiazem (TIAZAC) 240 MG 24 hr capsule  2. Which pharmacy/location (including street and city if local pharmacy) is medication to be sent to?   [place], [place]  3. Do they need a 30 day or 90 day supply? 90 with refills

## 2019-12-03 NOTE — Telephone Encounter (Signed)
Pt's medication was sent to pt's pharmacy as requested. Confirmation received.  °

## 2019-12-08 ENCOUNTER — Other Ambulatory Visit: Payer: Self-pay | Admitting: Cardiology

## 2019-12-20 ENCOUNTER — Encounter: Payer: Self-pay | Admitting: Cardiology

## 2019-12-20 ENCOUNTER — Other Ambulatory Visit: Payer: Self-pay

## 2019-12-20 ENCOUNTER — Ambulatory Visit: Payer: Medicare HMO | Admitting: Cardiology

## 2019-12-20 VITALS — BP 120/70 | HR 93 | Ht 67.0 in | Wt 158.8 lb

## 2019-12-20 DIAGNOSIS — I10 Essential (primary) hypertension: Secondary | ICD-10-CM

## 2019-12-20 DIAGNOSIS — E119 Type 2 diabetes mellitus without complications: Secondary | ICD-10-CM | POA: Diagnosis not present

## 2019-12-20 DIAGNOSIS — I4821 Permanent atrial fibrillation: Secondary | ICD-10-CM | POA: Diagnosis not present

## 2019-12-20 DIAGNOSIS — E785 Hyperlipidemia, unspecified: Secondary | ICD-10-CM | POA: Diagnosis not present

## 2019-12-20 NOTE — Patient Instructions (Addendum)
Medication Instructions:  Your physician recommends that you continue on your current medications as directed. Please refer to the Current Medication list given to you today.  *If you need a refill on your cardiac medications before your next appointment, please call your pharmacy*  Lab Work: If you have labs (blood work) drawn today and your tests are completely normal, you will receive your results only by: . MyChart Message (if you have MyChart) OR . A paper copy in the mail If you have any lab test that is abnormal or we need to change your treatment, we will call you to review the results.  Testing/Procedures: None ordered today.  Follow-Up: At [facility], you and your health needs are our priority.  As part of our continuing mission to provide you with exceptional heart care, we have created designated Provider Care Teams.  These Care Teams include your primary Cardiologist (physician) and Advanced Practice Providers (APPs -  Physician Assistants and Nurse Practitioners) who all work together to provide you with the care you need, when you need it.  We recommend signing up for the patient portal called "MyChart".  Sign up information is provided on this After Visit Summary.  MyChart is used to connect with patients for Virtual Visits (Telemedicine).  Patients are able to view lab/test results, encounter notes, upcoming appointments, etc.  Non-urgent messages can be sent to your provider as well.   To learn more about what you can do with MyChart, go to https://www.mychart.com.    Your next appointment:   12 month(s)  The format for your next appointment:   In Person  Provider:   You may see Mark Sk[place], MD or one of the following Advanced Practice Providers on your designated Care Team:    Lori Gerhardt, NP  Laura Ingold, NP  Jill McDaniel, NP    

## 2019-12-20 NOTE — Progress Notes (Signed)
Cardiology Office Note:    Date:  12/20/2019   ID:  KESHAN TUREAUD, DOB [birth year], MRN [id number]  PCP:  Wenda Low, MD  Cardiologist:  Candee Furbish, MD  Electrophysiologist:  None   Referring MD: Wenda Low, MD     History of Present Illness:    Robert Irwin is a 79 y.o. male permanent atrial fibrillation here for follow-up.  Has diabetes hyperlipidemia. Hemoglobin A1c 6.4 previously with hemoglobin of 13 and creatinine of 1.  Doing well.  Not having any trouble with medications.  Stress test in 2008 was low risk, normal EF on echocardiogram.  He did have a hemorrhoidal band done at last visit he was telling me.  Enjoying golf.  No chest pain fevers chills nausea vomiting syncope bleeding.  2018 no more ETOH.   Past Medical History:  Diagnosis Date  . A-fib ([place])   . Atrial fibrillation ([place])   . Diabetes mellitus   . ED (erectile dysfunction)   . Epistaxis   . Hyperlipidemia   . Hypertension     Past Surgical History:  Procedure Laterality Date  . NASAL MASS EXCISION    . NASAL SINUS SURGERY  12/16/2011   Procedure: ENDOSCOPIC SINUS SURGERY;  Surgeon: Rozetta Nunnery, MD;  Location: [facility];  Service: ENT;  Laterality: N/A;  for control of epistaxis    Current Medications: Current Meds  Medication Sig  . atorvastatin (LIPITOR) 20 MG tablet Take 1 tablet (20 mg total) by mouth daily.  . calcium citrate-vitamin D 200-200 MG-UNIT TABS Take 1 tablet by mouth daily.  Marland Kitchen COLCRYS 0.6 MG tablet Take 0.6 mg by mouth as needed.  . diltiazem (TIAZAC) 240 MG 24 hr capsule Take 1 capsule (240 mg total) by mouth daily. Please make overdue appt with Dr. Marlou Porch before anymore refills. 1st attempt  . fish oil-omega-3 fatty acids 1000 MG capsule Take 2 g by mouth daily.  Marland Kitchen glimepiride (AMARYL) 2 MG tablet Take 1 mg by mouth daily before breakfast.  . Multiple Vitamin (MULITIVITAMIN WITH MINERALS) TABS Take 1 tablet by mouth daily.  Alveda Reasons 20 MG TABS tablet TAKE 1 TABLET  BY  MOUTH DAILY WITH SUPPER.     Allergies:   Patient has no known allergies.   Social History   Socioeconomic History  . Marital status: Married    Spouse name: Not on file  . Number of children: Not on file  . Years of education: Not on file  . Highest education level: Not on file  Occupational History  . Not on file  Tobacco Use  . Smoking status: Former Smoker    Quit date: 07/25/1990    Years since quitting: 29.4  . Smokeless tobacco: Never Used  Substance and Sexual Activity  . Alcohol use: Yes  . Drug use: No  . Sexual activity: Not on file  Other Topics Concern  . Not on file  Social History Narrative  . Not on file   Social Determinants of Health   Financial Resource Strain:   . Difficulty of Paying Living Expenses:   Food Insecurity:   . Worried About Charity fundraiser in the Last Year:   . Arboriculturist in the Last Year:   Transportation Needs:   . Film/video editor (Medical):   Marland Kitchen Lack of Transportation (Non-Medical):   Physical Activity:   . Days of Exercise per Week:   . Minutes of Exercise per Session:   Stress:   .  Feeling of Stress :   Social Connections:   . Frequency of Communication with Friends and Family:   . Frequency of Social Gatherings with Friends and Family:   . Attends Religious Services:   . Active Member of Clubs or Organizations:   . Attends Archivist Meetings:   Marland Kitchen Marital Status:        EKGs/Labs/Other Studies Reviewed:     EKG:  EKG is  ordered today.  The ekg ordered today demonstrates atrial fibrillation heart rate in the 80s  Recent Labs: No results found for requested labs within last 8760 hours.  Recent Lipid Panel No results found for: CHOL, TRIG, HDL, CHOLHDL, VLDL, LDLCALC, LDLDIRECT  Physical Exam:    VS:  BP 120/70   Pulse 93   Ht 5\' 7"  (1.702 m)   Wt 158 lb 12.8 oz (72 kg)   SpO2 98%   BMI 24.87 kg/m     Wt Readings from Last 3 Encounters:  12/20/19 158 lb 12.8 oz (72 kg)  12/11/18  159 lb 12.8 oz (72.5 kg)  01/15/18 156 lb 6.4 oz (70.9 kg)     GEN:  Well nourished, well developed in no acute distress HEENT: Normal NECK: No JVD; No carotid bruits LYMPHATICS: No lymphadenopathy CARDIAC: irreg, no murmurs, rubs, gallops RESPIRATORY:  Clear to auscultation without rales, wheezing or rhonchi  ABDOMEN: Soft, non-tender, non-distended MUSCULOSKELETAL:  No edema; No deformity  SKIN: Warm and dry NEUROLOGIC:  Alert and oriented x 3 PSYCHIATRIC:  Normal affect   ASSESSMENT:    No diagnosis found. PLAN:    In order of problems listed above:  Permanent atrial fibrillation -Well rate controlled.  Normal EF.  On Xarelto.  Continue.  Doing well. -No changes in medications.  Hyperlipidemia -No myalgias on atorvastatin.  LDL approximately 109.  Diabetes with hyperlipidemia -Dr. Lysle Rubens has been monitoring.  Prior hemoglobin A1c 6.4.  Excellent.    Medication Adjustments/Labs and Tests Ordered: Current medicines are reviewed at length with the patient today.  Concerns regarding medicines are outlined above.  No orders of the defined types were placed in this encounter.  No orders of the defined types were placed in this encounter.   Patient Instructions  Medication Instructions:  Your physician recommends that you continue on your current medications as directed. Please refer to the Current Medication list given to you today.  *If you need a refill on your cardiac medications before your next appointment, please call your pharmacy*  Lab Work:  If you have labs (blood work) drawn today and your tests are completely normal, you will receive your results only by: Marland Kitchen MyChart Message (if you have MyChart) OR . A paper copy in the mail If you have any lab test that is abnormal or we need to change your treatment, we will call you to review the results.   Testing/Procedures: None ordered today.   Follow-Up: At [facility], you and your health needs are our  priority.  As part of our continuing mission to provide you with exceptional heart care, we have created designated Provider Care Teams.  These Care Teams include your primary Cardiologist (physician) and Advanced Practice Providers (APPs -  Physician Assistants and Nurse Practitioners) who all work together to provide you with the care you need, when you need it.  We recommend signing up for the patient portal called "MyChart".  Sign up information is provided on this After Visit Summary.  MyChart is used to connect with patients for  Virtual Visits (Telemedicine).  Patients are able to view lab/test results, encounter notes, upcoming appointments, etc.  Non-urgent messages can be sent to your provider as well.   To learn more about what you can do with MyChart, go to [email or web address].    Your next appointment:   12 month(s)  The format for your next appointment:   In Person  Provider:   You may see Candee Furbish, MD or one of the following Advanced Practice Providers on your designated Care Team:    Truitt Merle, NP  Cecilie Kicks, NP  Kathyrn Drown, NP     Signed, Candee Furbish, MD  12/20/2019 9:08 AM    [place]

## 2019-12-23 ENCOUNTER — Other Ambulatory Visit: Payer: Self-pay | Admitting: Cardiology

## 2019-12-25 MED ORDER — DILTIAZEM HCL ER BEADS 240 MG PO CP24: 240.0000 mg | ORAL_CAPSULE | Freq: Every day | ORAL | 3 refills | Status: DC

## 2019-12-30 NOTE — Addendum Note (Signed)
Addended by: Maren Beach, Joe Tanney A on: 12/30/2019 05:16 PM   Modules accepted: Orders

## 2019-12-31 NOTE — Addendum Note (Signed)
Addended by: Janan Halter F on: 12/31/2019 02:46 PM   Modules accepted: Orders

## 2020-01-18 ENCOUNTER — Other Ambulatory Visit: Payer: Self-pay | Admitting: Cardiology

## 2020-01-20 NOTE — Telephone Encounter (Signed)
Pt last saw Dr Marlou Porch 12/20/19, last labs 10/01/19 Creat 0.89 at [facility] per [facility], age 79, weight 72kg, CrCl 69.66, based on CrCl pt is on appropriate dosage of Xarelto 20mg  QD.  Will refill rx.

## 2020-02-04 DIAGNOSIS — K641 Second degree hemorrhoids: Secondary | ICD-10-CM | POA: Diagnosis not present

## 2020-02-22 ENCOUNTER — Other Ambulatory Visit: Payer: Self-pay | Admitting: Cardiology

## 2020-02-24 DIAGNOSIS — M19049 Primary osteoarthritis, unspecified hand: Secondary | ICD-10-CM | POA: Diagnosis not present

## 2020-02-24 DIAGNOSIS — M778 Other enthesopathies, not elsewhere classified: Secondary | ICD-10-CM | POA: Diagnosis not present

## 2020-03-13 DIAGNOSIS — M79641 Pain in right hand: Secondary | ICD-10-CM | POA: Diagnosis not present

## 2020-03-13 DIAGNOSIS — M79642 Pain in left hand: Secondary | ICD-10-CM | POA: Diagnosis not present

## 2020-03-24 DIAGNOSIS — M19049 Primary osteoarthritis, unspecified hand: Secondary | ICD-10-CM | POA: Diagnosis not present

## 2020-03-24 DIAGNOSIS — I10 Essential (primary) hypertension: Secondary | ICD-10-CM | POA: Diagnosis not present

## 2020-03-24 DIAGNOSIS — N400 Enlarged prostate without lower urinary tract symptoms: Secondary | ICD-10-CM | POA: Diagnosis not present

## 2020-03-24 DIAGNOSIS — I482 Chronic atrial fibrillation, unspecified: Secondary | ICD-10-CM | POA: Diagnosis not present

## 2020-03-24 DIAGNOSIS — M1990 Unspecified osteoarthritis, unspecified site: Secondary | ICD-10-CM | POA: Diagnosis not present

## 2020-03-24 DIAGNOSIS — E782 Mixed hyperlipidemia: Secondary | ICD-10-CM | POA: Diagnosis not present

## 2020-03-24 DIAGNOSIS — E1169 Type 2 diabetes mellitus with other specified complication: Secondary | ICD-10-CM | POA: Diagnosis not present

## 2020-04-06 DIAGNOSIS — H26492 Other secondary cataract, left eye: Secondary | ICD-10-CM | POA: Diagnosis not present

## 2020-04-06 DIAGNOSIS — Z961 Presence of intraocular lens: Secondary | ICD-10-CM | POA: Diagnosis not present

## 2020-04-06 DIAGNOSIS — E113293 Type 2 diabetes mellitus with mild nonproliferative diabetic retinopathy without macular edema, bilateral: Secondary | ICD-10-CM | POA: Diagnosis not present

## 2020-04-06 DIAGNOSIS — H353111 Nonexudative age-related macular degeneration, right eye, early dry stage: Secondary | ICD-10-CM | POA: Diagnosis not present

## 2020-04-07 DIAGNOSIS — M109 Gout, unspecified: Secondary | ICD-10-CM | POA: Diagnosis not present

## 2020-04-07 DIAGNOSIS — E782 Mixed hyperlipidemia: Secondary | ICD-10-CM | POA: Diagnosis not present

## 2020-04-07 DIAGNOSIS — I10 Essential (primary) hypertension: Secondary | ICD-10-CM | POA: Diagnosis not present

## 2020-04-07 DIAGNOSIS — E1169 Type 2 diabetes mellitus with other specified complication: Secondary | ICD-10-CM | POA: Diagnosis not present

## 2020-04-07 DIAGNOSIS — Z23 Encounter for immunization: Secondary | ICD-10-CM | POA: Diagnosis not present

## 2020-04-07 DIAGNOSIS — I482 Chronic atrial fibrillation, unspecified: Secondary | ICD-10-CM | POA: Diagnosis not present

## 2020-04-07 DIAGNOSIS — M19049 Primary osteoarthritis, unspecified hand: Secondary | ICD-10-CM | POA: Diagnosis not present

## 2020-04-07 DIAGNOSIS — D6869 Other thrombophilia: Secondary | ICD-10-CM | POA: Diagnosis not present

## 2020-04-20 DIAGNOSIS — M19049 Primary osteoarthritis, unspecified hand: Secondary | ICD-10-CM | POA: Diagnosis not present

## 2020-04-20 DIAGNOSIS — E1169 Type 2 diabetes mellitus with other specified complication: Secondary | ICD-10-CM | POA: Diagnosis not present

## 2020-04-20 DIAGNOSIS — N400 Enlarged prostate without lower urinary tract symptoms: Secondary | ICD-10-CM | POA: Diagnosis not present

## 2020-04-20 DIAGNOSIS — M1990 Unspecified osteoarthritis, unspecified site: Secondary | ICD-10-CM | POA: Diagnosis not present

## 2020-04-20 DIAGNOSIS — E782 Mixed hyperlipidemia: Secondary | ICD-10-CM | POA: Diagnosis not present

## 2020-04-20 DIAGNOSIS — I482 Chronic atrial fibrillation, unspecified: Secondary | ICD-10-CM | POA: Diagnosis not present

## 2020-04-20 DIAGNOSIS — I10 Essential (primary) hypertension: Secondary | ICD-10-CM | POA: Diagnosis not present

## 2020-05-12 DIAGNOSIS — I10 Essential (primary) hypertension: Secondary | ICD-10-CM | POA: Diagnosis not present

## 2020-05-12 DIAGNOSIS — M19049 Primary osteoarthritis, unspecified hand: Secondary | ICD-10-CM | POA: Diagnosis not present

## 2020-05-12 DIAGNOSIS — N400 Enlarged prostate without lower urinary tract symptoms: Secondary | ICD-10-CM | POA: Diagnosis not present

## 2020-05-12 DIAGNOSIS — E1169 Type 2 diabetes mellitus with other specified complication: Secondary | ICD-10-CM | POA: Diagnosis not present

## 2020-05-12 DIAGNOSIS — M1990 Unspecified osteoarthritis, unspecified site: Secondary | ICD-10-CM | POA: Diagnosis not present

## 2020-05-12 DIAGNOSIS — E782 Mixed hyperlipidemia: Secondary | ICD-10-CM | POA: Diagnosis not present

## 2020-06-22 DIAGNOSIS — E782 Mixed hyperlipidemia: Secondary | ICD-10-CM | POA: Diagnosis not present

## 2020-06-22 DIAGNOSIS — N400 Enlarged prostate without lower urinary tract symptoms: Secondary | ICD-10-CM | POA: Diagnosis not present

## 2020-06-22 DIAGNOSIS — E1169 Type 2 diabetes mellitus with other specified complication: Secondary | ICD-10-CM | POA: Diagnosis not present

## 2020-06-22 DIAGNOSIS — I482 Chronic atrial fibrillation, unspecified: Secondary | ICD-10-CM | POA: Diagnosis not present

## 2020-06-22 DIAGNOSIS — M1990 Unspecified osteoarthritis, unspecified site: Secondary | ICD-10-CM | POA: Diagnosis not present

## 2020-06-22 DIAGNOSIS — M19049 Primary osteoarthritis, unspecified hand: Secondary | ICD-10-CM | POA: Diagnosis not present

## 2020-06-22 DIAGNOSIS — I10 Essential (primary) hypertension: Secondary | ICD-10-CM | POA: Diagnosis not present

## 2020-06-30 DIAGNOSIS — M79641 Pain in right hand: Secondary | ICD-10-CM | POA: Diagnosis not present

## 2020-06-30 DIAGNOSIS — M79642 Pain in left hand: Secondary | ICD-10-CM | POA: Diagnosis not present

## 2020-08-24 DIAGNOSIS — E1169 Type 2 diabetes mellitus with other specified complication: Secondary | ICD-10-CM | POA: Diagnosis not present

## 2020-08-24 DIAGNOSIS — N400 Enlarged prostate without lower urinary tract symptoms: Secondary | ICD-10-CM | POA: Diagnosis not present

## 2020-08-24 DIAGNOSIS — E782 Mixed hyperlipidemia: Secondary | ICD-10-CM | POA: Diagnosis not present

## 2020-08-24 DIAGNOSIS — I10 Essential (primary) hypertension: Secondary | ICD-10-CM | POA: Diagnosis not present

## 2020-08-24 DIAGNOSIS — M1990 Unspecified osteoarthritis, unspecified site: Secondary | ICD-10-CM | POA: Diagnosis not present

## 2020-08-24 DIAGNOSIS — M19049 Primary osteoarthritis, unspecified hand: Secondary | ICD-10-CM | POA: Diagnosis not present

## 2020-08-24 DIAGNOSIS — I482 Chronic atrial fibrillation, unspecified: Secondary | ICD-10-CM | POA: Diagnosis not present

## 2020-08-31 ENCOUNTER — Other Ambulatory Visit: Payer: Self-pay | Admitting: Cardiology

## 2020-08-31 NOTE — Telephone Encounter (Signed)
Pt's age 80, wt 12 kg, SCr 0.87, CrCl 60.57, last ov w/ Dr. Marlou Porch 12/20/19.

## 2020-09-09 DIAGNOSIS — M1990 Unspecified osteoarthritis, unspecified site: Secondary | ICD-10-CM | POA: Diagnosis not present

## 2020-09-09 DIAGNOSIS — E1169 Type 2 diabetes mellitus with other specified complication: Secondary | ICD-10-CM | POA: Diagnosis not present

## 2020-09-09 DIAGNOSIS — N400 Enlarged prostate without lower urinary tract symptoms: Secondary | ICD-10-CM | POA: Diagnosis not present

## 2020-09-09 DIAGNOSIS — M19049 Primary osteoarthritis, unspecified hand: Secondary | ICD-10-CM | POA: Diagnosis not present

## 2020-09-09 DIAGNOSIS — E782 Mixed hyperlipidemia: Secondary | ICD-10-CM | POA: Diagnosis not present

## 2020-09-09 DIAGNOSIS — I10 Essential (primary) hypertension: Secondary | ICD-10-CM | POA: Diagnosis not present

## 2020-09-30 ENCOUNTER — Other Ambulatory Visit: Payer: Self-pay | Admitting: Cardiology

## 2020-10-01 MED ORDER — DILTIAZEM HCL ER BEADS 240 MG PO CP24: 240.0000 mg | ORAL_CAPSULE | Freq: Every day | ORAL | 0 refills | Status: DC

## 2020-10-02 ENCOUNTER — Other Ambulatory Visit: Payer: Self-pay | Admitting: Cardiology

## 2020-10-07 DIAGNOSIS — E782 Mixed hyperlipidemia: Secondary | ICD-10-CM | POA: Diagnosis not present

## 2020-10-07 DIAGNOSIS — D6869 Other thrombophilia: Secondary | ICD-10-CM | POA: Diagnosis not present

## 2020-10-07 DIAGNOSIS — J309 Allergic rhinitis, unspecified: Secondary | ICD-10-CM | POA: Diagnosis not present

## 2020-10-07 DIAGNOSIS — I10 Essential (primary) hypertension: Secondary | ICD-10-CM | POA: Diagnosis not present

## 2020-10-07 DIAGNOSIS — N400 Enlarged prostate without lower urinary tract symptoms: Secondary | ICD-10-CM | POA: Diagnosis not present

## 2020-10-07 DIAGNOSIS — M1990 Unspecified osteoarthritis, unspecified site: Secondary | ICD-10-CM | POA: Diagnosis not present

## 2020-10-07 DIAGNOSIS — I482 Chronic atrial fibrillation, unspecified: Secondary | ICD-10-CM | POA: Diagnosis not present

## 2020-10-07 DIAGNOSIS — Z1389 Encounter for screening for other disorder: Secondary | ICD-10-CM | POA: Diagnosis not present

## 2020-10-07 DIAGNOSIS — M109 Gout, unspecified: Secondary | ICD-10-CM | POA: Diagnosis not present

## 2020-10-07 DIAGNOSIS — Z0000 Encounter for general adult medical examination without abnormal findings: Secondary | ICD-10-CM | POA: Diagnosis not present

## 2020-10-07 DIAGNOSIS — E1169 Type 2 diabetes mellitus with other specified complication: Secondary | ICD-10-CM | POA: Diagnosis not present

## 2020-10-29 DIAGNOSIS — H353111 Nonexudative age-related macular degeneration, right eye, early dry stage: Secondary | ICD-10-CM | POA: Diagnosis not present

## 2020-10-29 DIAGNOSIS — E119 Type 2 diabetes mellitus without complications: Secondary | ICD-10-CM | POA: Diagnosis not present

## 2020-10-29 DIAGNOSIS — H353123 Nonexudative age-related macular degeneration, left eye, advanced atrophic without subfoveal involvement: Secondary | ICD-10-CM | POA: Diagnosis not present

## 2020-10-29 DIAGNOSIS — H26492 Other secondary cataract, left eye: Secondary | ICD-10-CM | POA: Diagnosis not present

## 2020-11-03 DIAGNOSIS — E782 Mixed hyperlipidemia: Secondary | ICD-10-CM | POA: Diagnosis not present

## 2020-11-03 DIAGNOSIS — N400 Enlarged prostate without lower urinary tract symptoms: Secondary | ICD-10-CM | POA: Diagnosis not present

## 2020-11-03 DIAGNOSIS — M1990 Unspecified osteoarthritis, unspecified site: Secondary | ICD-10-CM | POA: Diagnosis not present

## 2020-11-03 DIAGNOSIS — I10 Essential (primary) hypertension: Secondary | ICD-10-CM | POA: Diagnosis not present

## 2020-11-03 DIAGNOSIS — E1169 Type 2 diabetes mellitus with other specified complication: Secondary | ICD-10-CM | POA: Diagnosis not present

## 2020-11-03 DIAGNOSIS — M19049 Primary osteoarthritis, unspecified hand: Secondary | ICD-10-CM | POA: Diagnosis not present

## 2020-12-05 ENCOUNTER — Other Ambulatory Visit: Payer: Self-pay | Admitting: Cardiology

## 2020-12-10 DIAGNOSIS — E782 Mixed hyperlipidemia: Secondary | ICD-10-CM | POA: Diagnosis not present

## 2020-12-10 DIAGNOSIS — M19049 Primary osteoarthritis, unspecified hand: Secondary | ICD-10-CM | POA: Diagnosis not present

## 2020-12-10 DIAGNOSIS — I10 Essential (primary) hypertension: Secondary | ICD-10-CM | POA: Diagnosis not present

## 2020-12-10 DIAGNOSIS — M1990 Unspecified osteoarthritis, unspecified site: Secondary | ICD-10-CM | POA: Diagnosis not present

## 2020-12-10 DIAGNOSIS — E1169 Type 2 diabetes mellitus with other specified complication: Secondary | ICD-10-CM | POA: Diagnosis not present

## 2020-12-10 DIAGNOSIS — N400 Enlarged prostate without lower urinary tract symptoms: Secondary | ICD-10-CM | POA: Diagnosis not present

## 2020-12-17 ENCOUNTER — Other Ambulatory Visit: Payer: Self-pay | Admitting: Cardiology

## 2020-12-18 DIAGNOSIS — M79642 Pain in left hand: Secondary | ICD-10-CM | POA: Diagnosis not present

## 2020-12-18 DIAGNOSIS — M65332 Trigger finger, left middle finger: Secondary | ICD-10-CM | POA: Diagnosis not present

## 2020-12-18 DIAGNOSIS — M65342 Trigger finger, left ring finger: Secondary | ICD-10-CM | POA: Diagnosis not present

## 2020-12-18 DIAGNOSIS — M79641 Pain in right hand: Secondary | ICD-10-CM | POA: Diagnosis not present

## 2021-01-20 ENCOUNTER — Other Ambulatory Visit: Payer: Self-pay | Admitting: Cardiology

## 2021-01-21 NOTE — Telephone Encounter (Signed)
Pt last saw Dr Marlou Porch 12/20/19, pt is overdue for follow-up. Pt has follow-up appt scheduled for Dr Marlou Porch on 02/09/21, last labs 10/07/20 Creat 0.85 at [facility] per [facility], age 80, weight 72kg, CrCl 71.76, based on CrCl pt is on appropriate dosage of Xarelto 20mg  QD.  Will refill rx.

## 2021-02-09 ENCOUNTER — Ambulatory Visit: Payer: Medicare HMO | Admitting: Cardiology

## 2021-02-09 ENCOUNTER — Other Ambulatory Visit: Payer: Self-pay

## 2021-02-09 ENCOUNTER — Encounter: Payer: Self-pay | Admitting: Cardiology

## 2021-02-09 VITALS — BP 110/60 | HR 63 | Ht 67.0 in | Wt 153.0 lb

## 2021-02-09 DIAGNOSIS — E785 Hyperlipidemia, unspecified: Secondary | ICD-10-CM | POA: Diagnosis not present

## 2021-02-09 DIAGNOSIS — E119 Type 2 diabetes mellitus without complications: Secondary | ICD-10-CM

## 2021-02-09 DIAGNOSIS — I4821 Permanent atrial fibrillation: Secondary | ICD-10-CM

## 2021-02-09 DIAGNOSIS — I10 Essential (primary) hypertension: Secondary | ICD-10-CM

## 2021-02-09 NOTE — Progress Notes (Signed)
Cardiology Office Note:    Date:  02/09/2021   ID:  Robert Irwin, DOB [birth year], MRN [id number]  PCP:  Wenda Low, MD   [facility] Providers Cardiologist:  Candee Furbish, MD     Referring MD: Wenda Low, MD    History of Present Illness:    Robert Irwin is a 80 y.o. male here for the follow-up of permanent atrial fibrillation.  Doing well under rate control.  No fever chills nausea vomiting syncope bleeding.  He states that he stopped consuming alcohol 4 years ago.  He feels well.  Has not had a gout flare in quite some time.  Enjoys golf but sometimes feels aches in his back and legs after playing.  Past Medical History:  Diagnosis Date   A-fib [facility])    Atrial fibrillation ([place])    Diabetes mellitus    ED (erectile dysfunction)    Epistaxis    Hyperlipidemia    Hypertension     Past Surgical History:  Procedure Laterality Date   NASAL MASS EXCISION     NASAL SINUS SURGERY  12/16/2011   Procedure: ENDOSCOPIC SINUS SURGERY;  Surgeon: Rozetta Nunnery, MD;  Location: [facility];  Service: ENT;  Laterality: N/A;  for control of epistaxis    Current Medications: Current Meds  Medication Sig   atorvastatin (LIPITOR) 20 MG tablet TAKE 1 TABLET EVERY DAY (KEEP UPCOMING MD APPOINTMENT FOR REFILLS)   calcium citrate-vitamin D 200-200 MG-UNIT TABS Take 1 tablet by mouth daily.   COLCRYS 0.6 MG tablet Take 0.6 mg by mouth as needed.   diltiazem (TIAZAC) 240 MG 24 hr capsule Take 1 capsule (240 mg total) by mouth daily. Please keep upcoming appointment in July 2022 for future refills. Thank you   fish oil-omega-3 fatty acids 1000 MG capsule Take 2 g by mouth daily.   glimepiride (AMARYL) 2 MG tablet Take 1 mg by mouth daily before breakfast.   Multiple Vitamin (MULITIVITAMIN WITH MINERALS) TABS Take 1 tablet by mouth daily.   rivaroxaban (XARELTO) 20 MG TABS tablet TAKE 1 TABLET EVERY DAY WITH SUPPER.  Pt Overdue for follow-up, MUST see MD for FUTURE refills.     Allergies:    Patient has no known allergies.   Social History   Socioeconomic History   Marital status: Married    Spouse name: Not on file   Number of children: Not on file   Years of education: Not on file   Highest education level: Not on file  Occupational History   Not on file  Tobacco Use   Smoking status: Former    Types: Cigarettes    Quit date: 07/25/1990    Years since quitting: 30.5   Smokeless tobacco: Never  Vaping Use   Vaping Use: Never used  Substance and Sexual Activity   Alcohol use: Yes   Drug use: No   Sexual activity: Not on file  Other Topics Concern   Not on file  Social History Narrative   Not on file   Social Determinants of Health   Financial Resource Strain: Not on file  Food Insecurity: Not on file  Transportation Needs: Not on file  Physical Activity: Not on file  Stress: Not on file  Social Connections: Not on file      EKGs/Labs/Other Studies Reviewed:    The following studies were reviewed today: No new studies.  EKG:  EKG is  ordered today.  The ekg ordered today demonstrates atrial fibrillation heart rate 63  bpm no other changes.  Recent Labs: No results found for requested labs within last 8760 hours.  Recent Lipid Panel No results found for: CHOL, TRIG, HDL, CHOLHDL, VLDL, LDLCALC, LDLDIRECT   Risk Assessment/Calculations:         Physical Exam:    VS:  BP 110/60 (BP Location: Left Arm, Patient Position: Sitting, Cuff Size: Normal)   Pulse 63   Ht 5\' 7"  (1.702 m)   Wt 153 lb (69.4 kg)   SpO2 96%   BMI 23.96 kg/m     Wt Readings from Last 3 Encounters:  02/09/21 153 lb (69.4 kg)  12/20/19 158 lb 12.8 oz (72 kg)  12/11/18 159 lb 12.8 oz (72.5 kg)     GEN:  Well nourished, well developed in no acute distress HEENT: Normal NECK: No JVD; No carotid bruits LYMPHATICS: No lymphadenopathy CARDIAC: irreg normal rate, no murmurs, rubs, gallops RESPIRATORY:  Clear to auscultation without rales, wheezing or rhonchi  ABDOMEN:  Soft, non-tender, non-distended MUSCULOSKELETAL:  No edema; No deformity  SKIN: Warm and dry NEUROLOGIC:  Alert and oriented x 3 PSYCHIATRIC:  Normal affect   ASSESSMENT:    1. Permanent atrial fibrillation ([place])   2. Diabetes mellitus with coincident hypertension ([place])   3. Hyperlipidemia, unspecified hyperlipidemia type    PLAN:    In order of problems listed above:  Permanent atrial fibrillation - Excellent rate control on diltiazem ER 240 mg once a day.  No changes made to medical management.  Refills as needed.  Chronic anticoagulation - On Xarelto 20 mg a day.  Last hemoglobin 13.9 creatinine 0.85 potassium 4.8.  Doing well.  No bleeding issues.  Continue with current medical management.  Continue with close surveillance lab work.  Last lab work reviewed from outside office.  Diabetes with hyperlipidemia - On atorvastatin 20 mg a day.  LDL 94 most recently.  He is also on glimepiride 2 mg a day.  Last hemoglobin A1c 6.2 down from 6.4.  Dr. Lysle Rubens has been monitoring.  Gout - Has not had an issue in quite some time.  Takes colchicine 0.6 once a day.  1 year follow-up    Medication Adjustments/Labs and Tests Ordered: Current medicines are reviewed at length with the patient today.  Concerns regarding medicines are outlined above.  Orders Placed This Encounter  Procedures   EKG 12-Lead   No orders of the defined types were placed in this encounter.   Patient Instructions  Medication Instructions:  The current medical regimen is effective;  continue present plan and medications.  *If you need a refill on your cardiac medications before your next appointment, please call your pharmacy*  Follow-Up: At [facility], you and your health needs are our priority.  As part of our continuing mission to provide you with exceptional heart care, we have created designated Provider Care Teams.  These Care Teams include your primary Cardiologist (physician) and Advanced Practice  Providers (APPs -  Physician Assistants and Nurse Practitioners) who all work together to provide you with the care you need, when you need it.  We recommend signing up for the patient portal called "MyChart".  Sign up information is provided on this After Visit Summary.  MyChart is used to connect with patients for Virtual Visits (Telemedicine).  Patients are able to view lab/test results, encounter notes, upcoming appointments, etc.  Non-urgent messages can be sent to your provider as well.   To learn more about what you can do with MyChart, go to  [email or web address].    Your next appointment:   1 year(s)  The format for your next appointment:   In Person  Provider:   Candee Furbish, MD   Thank you for choosing [facility]!!     Signed, Candee Furbish, MD  02/09/2021 9:33 AM    [place]

## 2021-02-09 NOTE — Patient Instructions (Signed)
Medication Instructions:  The current medical regimen is effective;  continue present plan and medications.  *If you need a refill on your cardiac medications before your next appointment, please call your pharmacy*  Follow-Up: At [facility], you and your health needs are our priority.  As part of our continuing mission to provide you with exceptional heart care, we have created designated Provider Care Teams.  These Care Teams include your primary Cardiologist (physician) and Advanced Practice Providers (APPs -  Physician Assistants and Nurse Practitioners) who all work together to provide you with the care you need, when you need it.  We recommend signing up for the patient portal called "MyChart".  Sign up information is provided on this After Visit Summary.  MyChart is used to connect with patients for Virtual Visits (Telemedicine).  Patients are able to view lab/test results, encounter notes, upcoming appointments, etc.  Non-urgent messages can be sent to your provider as well.   To learn more about what you can do with MyChart, go to [email or web address].    Your next appointment:   1 year(s)  The format for your next appointment:   In Person  Provider:   Mark Skains, MD   Thank you for choosing [facility]!!    

## 2021-02-15 DIAGNOSIS — M7061 Trochanteric bursitis, right hip: Secondary | ICD-10-CM | POA: Diagnosis not present

## 2021-02-15 DIAGNOSIS — M25551 Pain in right hip: Secondary | ICD-10-CM | POA: Diagnosis not present

## 2021-02-15 DIAGNOSIS — M25511 Pain in right shoulder: Secondary | ICD-10-CM | POA: Diagnosis not present

## 2021-02-19 DIAGNOSIS — M25511 Pain in right shoulder: Secondary | ICD-10-CM | POA: Diagnosis not present

## 2021-02-19 DIAGNOSIS — M542 Cervicalgia: Secondary | ICD-10-CM | POA: Diagnosis not present

## 2021-02-19 DIAGNOSIS — M5412 Radiculopathy, cervical region: Secondary | ICD-10-CM | POA: Diagnosis not present

## 2021-02-22 DIAGNOSIS — M7061 Trochanteric bursitis, right hip: Secondary | ICD-10-CM | POA: Diagnosis not present

## 2021-02-22 DIAGNOSIS — M1611 Unilateral primary osteoarthritis, right hip: Secondary | ICD-10-CM | POA: Diagnosis not present

## 2021-02-28 DIAGNOSIS — M19049 Primary osteoarthritis, unspecified hand: Secondary | ICD-10-CM | POA: Diagnosis not present

## 2021-02-28 DIAGNOSIS — N400 Enlarged prostate without lower urinary tract symptoms: Secondary | ICD-10-CM | POA: Diagnosis not present

## 2021-02-28 DIAGNOSIS — E782 Mixed hyperlipidemia: Secondary | ICD-10-CM | POA: Diagnosis not present

## 2021-02-28 DIAGNOSIS — E1169 Type 2 diabetes mellitus with other specified complication: Secondary | ICD-10-CM | POA: Diagnosis not present

## 2021-02-28 DIAGNOSIS — I10 Essential (primary) hypertension: Secondary | ICD-10-CM | POA: Diagnosis not present

## 2021-02-28 DIAGNOSIS — M1990 Unspecified osteoarthritis, unspecified site: Secondary | ICD-10-CM | POA: Diagnosis not present

## 2021-03-03 DIAGNOSIS — M25551 Pain in right hip: Secondary | ICD-10-CM | POA: Diagnosis not present

## 2021-03-08 DIAGNOSIS — M5412 Radiculopathy, cervical region: Secondary | ICD-10-CM | POA: Diagnosis not present

## 2021-03-16 DIAGNOSIS — R634 Abnormal weight loss: Secondary | ICD-10-CM | POA: Diagnosis not present

## 2021-03-16 DIAGNOSIS — M25551 Pain in right hip: Secondary | ICD-10-CM | POA: Diagnosis not present

## 2021-03-19 DIAGNOSIS — M5412 Radiculopathy, cervical region: Secondary | ICD-10-CM | POA: Diagnosis not present

## 2021-03-22 DIAGNOSIS — M25551 Pain in right hip: Secondary | ICD-10-CM | POA: Diagnosis not present

## 2021-03-22 DIAGNOSIS — M7061 Trochanteric bursitis, right hip: Secondary | ICD-10-CM | POA: Diagnosis not present

## 2021-03-22 DIAGNOSIS — M1611 Unilateral primary osteoarthritis, right hip: Secondary | ICD-10-CM | POA: Diagnosis not present

## 2021-03-23 DIAGNOSIS — M25511 Pain in right shoulder: Secondary | ICD-10-CM | POA: Diagnosis not present

## 2021-03-23 DIAGNOSIS — M5412 Radiculopathy, cervical region: Secondary | ICD-10-CM | POA: Diagnosis not present

## 2021-04-04 DIAGNOSIS — M25551 Pain in right hip: Secondary | ICD-10-CM | POA: Diagnosis not present

## 2021-04-07 DIAGNOSIS — E1169 Type 2 diabetes mellitus with other specified complication: Secondary | ICD-10-CM | POA: Diagnosis not present

## 2021-04-07 DIAGNOSIS — E782 Mixed hyperlipidemia: Secondary | ICD-10-CM | POA: Diagnosis not present

## 2021-04-07 DIAGNOSIS — I482 Chronic atrial fibrillation, unspecified: Secondary | ICD-10-CM | POA: Diagnosis not present

## 2021-04-07 DIAGNOSIS — M519 Unspecified thoracic, thoracolumbar and lumbosacral intervertebral disc disorder: Secondary | ICD-10-CM | POA: Diagnosis not present

## 2021-04-07 DIAGNOSIS — I10 Essential (primary) hypertension: Secondary | ICD-10-CM | POA: Diagnosis not present

## 2021-04-07 DIAGNOSIS — M109 Gout, unspecified: Secondary | ICD-10-CM | POA: Diagnosis not present

## 2021-04-07 DIAGNOSIS — M25551 Pain in right hip: Secondary | ICD-10-CM | POA: Diagnosis not present

## 2021-04-07 DIAGNOSIS — D6869 Other thrombophilia: Secondary | ICD-10-CM | POA: Diagnosis not present

## 2021-04-07 DIAGNOSIS — Z23 Encounter for immunization: Secondary | ICD-10-CM | POA: Diagnosis not present

## 2021-04-12 DIAGNOSIS — M25551 Pain in right hip: Secondary | ICD-10-CM | POA: Diagnosis not present

## 2021-04-12 DIAGNOSIS — M1611 Unilateral primary osteoarthritis, right hip: Secondary | ICD-10-CM | POA: Diagnosis not present

## 2021-04-21 DIAGNOSIS — Z01818 Encounter for other preprocedural examination: Secondary | ICD-10-CM | POA: Diagnosis not present

## 2021-04-21 DIAGNOSIS — M1611 Unilateral primary osteoarthritis, right hip: Secondary | ICD-10-CM | POA: Diagnosis not present

## 2021-04-22 NOTE — Progress Notes (Signed)
Pt. Needs orders for surgery.PAT on 04/23/21.

## 2021-04-22 NOTE — Patient Instructions (Addendum)
DUE TO COVID-19 ONLY ONE VISITOR IS ALLOWED TO COME WITH YOU AND STAY IN THE WAITING ROOM ONLY DURING PRE OP AND PROCEDURE.   **NO VISITORS ARE ALLOWED IN THE SHORT STAY AREA OR RECOVERY ROOM!!**  IF YOU WILL BE ADMITTED INTO THE HOSPITAL YOU ARE ALLOWED ONLY TWO SUPPORT PEOPLE DURING VISITATION HOURS ONLY (10AM -8PM)   The support person(s) may change daily. The support person(s) must pass our screening, gel in and out, and wear a mask at all times, including in the patient's room. Patients must also wear a mask when staff or their support person are in the room.  No visitors under the age of 49. Any visitor under the age of 70 must be accompanied by an adult.    Your procedure is scheduled on: 04/29/21   Report to [facility] Main Entrance    Report to admitting at : 10:45 AM   Call this number if you have problems the morning of surgery [phone]   Do not eat food :After Midnight.   May have liquids until : 10:30 AM   day of surgery  CLEAR LIQUID DIET  Foods Allowed                                                                     Foods Excluded  Water, Black Coffee and tea, regular and decaf                             liquids that you cannot  Plain Jell-O in any flavor  (No red)                                           see through such as: Fruit ices (not with fruit pulp)                                     milk, soups, orange juice              Iced Popsicles (No red)                                    All solid food                                   Apple juices Sports drinks like Gatorade (No red) Lightly seasoned clear broth or consume(fat free) Sugar,   Sample Menu Breakfast                                Lunch                                     Supper Cranberry juice  Beef broth                            Chicken broth Jell-O                                     Grape juice                           Apple juice Coffee or tea                         Jell-O                                      Popsicle                                                Coffee or tea                        Coffee or tea     Oral Hygiene is also important to reduce your risk of infection.                                    Remember - BRUSH YOUR TEETH THE MORNING OF SURGERY WITH YOUR REGULAR TOOTHPASTE   Do NOT smoke after Midnight   Take these medicines the morning of surgery with A SIP OF WATER: diltiazem.  DO NOT TAKE ANY ORAL DIABETIC MEDICATIONS DAY OF YOUR SURGERY                              You may not have any metal on your body including hair pins, jewelry, and body piercing             Do not wear lotions, powders, perfumes/cologne, or deodorant              Men may shave face and neck.   Do not bring valuables to the hospital. [place].   Contacts, dentures or bridgework may not be worn into surgery.   Bring small overnight bag day of surgery.    Patients discharged on the day of surgery will not be allowed to drive home.   Special Instructions: Bring a copy of your healthcare power of attorney and living will documents         the day of surgery if you haven't scanned them before.              Please read over the following fact sheets you were given: IF YOU HAVE QUESTIONS ABOUT YOUR PRE-OP INSTRUCTIONS PLEASE CALL [phone]   [facility] - Preparing for Surgery Before surgery, you can play an important role.  Because skin is not sterile, your skin needs to be as free of germs as possible.  You can reduce the number of germs on  your skin by washing with CHG (chlorahexidine gluconate) soap before surgery.  CHG is an antiseptic cleaner which kills germs and bonds with the skin to continue killing germs even after washing. Please DO NOT use if you have an allergy to CHG or antibacterial soaps.  If your skin becomes reddened/irritated stop using the CHG and inform your nurse when  you arrive at Short Stay. Do not shave (including legs and underarms) for at least 48 hours prior to the first CHG shower.  You may shave your face/neck. Please follow these instructions carefully:  1.  Shower with CHG Soap the night before surgery and the  morning of Surgery.  2.  If you choose to wash your hair, wash your hair first as usual with your  normal  shampoo.  3.  After you shampoo, rinse your hair and body thoroughly to remove the  shampoo.                           4.  Use CHG as you would any other liquid soap.  You can apply chg directly  to the skin and wash                       Gently with a scrungie or clean washcloth.  5.  Apply the CHG Soap to your body ONLY FROM THE NECK DOWN.   Do not use on face/ open                           Wound or open sores. Avoid contact with eyes, ears mouth and genitals (private parts).                       Wash face,  Genitals (private parts) with your normal soap.             6.  Wash thoroughly, paying special attention to the area where your surgery  will be performed.  7.  Thoroughly rinse your body with warm water from the neck down.  8.  DO NOT shower/wash with your normal soap after using and rinsing off  the CHG Soap.                9.  Pat yourself dry with a clean towel.            10.  Wear clean pajamas.            11.  Place clean sheets on your bed the night of your first shower and do not  sleep with pets. Day of Surgery : Do not apply any lotions/deodorants the morning of surgery.  Please wear clean clothes to the hospital/surgery center.  FAILURE TO FOLLOW THESE INSTRUCTIONS MAY RESULT IN THE CANCELLATION OF YOUR SURGERY PATIENT SIGNATURE_________________________________  NURSE SIGNATURE__________________________________  ________________________________________________________________________

## 2021-04-23 ENCOUNTER — Encounter (HOSPITAL_COMMUNITY)
Admission: RE | Admit: 2021-04-23 | Discharge: 2021-04-23 | Disposition: A | Discharge status: Home / Self Care | Payer: Medicare HMO | Source: Ambulatory Visit | Attending: Orthopedic Surgery | Admitting: Orthopedic Surgery

## 2021-04-23 ENCOUNTER — Encounter (HOSPITAL_COMMUNITY): Payer: Self-pay

## 2021-04-23 ENCOUNTER — Other Ambulatory Visit: Payer: Self-pay

## 2021-04-23 DIAGNOSIS — E119 Type 2 diabetes mellitus without complications: Secondary | ICD-10-CM | POA: Insufficient documentation

## 2021-04-23 DIAGNOSIS — M1611 Unilateral primary osteoarthritis, right hip: Secondary | ICD-10-CM | POA: Diagnosis not present

## 2021-04-23 DIAGNOSIS — Z01812 Encounter for preprocedural laboratory examination: Secondary | ICD-10-CM | POA: Diagnosis not present

## 2021-04-23 DIAGNOSIS — Z87891 Personal history of nicotine dependence: Secondary | ICD-10-CM | POA: Diagnosis not present

## 2021-04-23 DIAGNOSIS — Z7901 Long term (current) use of anticoagulants: Secondary | ICD-10-CM | POA: Insufficient documentation

## 2021-04-23 DIAGNOSIS — I10 Essential (primary) hypertension: Secondary | ICD-10-CM | POA: Diagnosis not present

## 2021-04-23 DIAGNOSIS — Z79899 Other long term (current) drug therapy: Secondary | ICD-10-CM | POA: Insufficient documentation

## 2021-04-23 DIAGNOSIS — I4891 Unspecified atrial fibrillation: Secondary | ICD-10-CM | POA: Insufficient documentation

## 2021-04-23 HISTORY — DX: Cardiac arrhythmia, unspecified: I49.9

## 2021-04-23 HISTORY — DX: Unspecified osteoarthritis, unspecified site: M19.90

## 2021-04-23 LAB — CBC
HCT: 38.8 % — ABNORMAL LOW (ref 39.0–52.0)
Hemoglobin: 12.8 g/dL — ABNORMAL LOW (ref 13.0–17.0)
MCH: 31.9 pg (ref 26.0–34.0)
MCHC: 33 g/dL (ref 30.0–36.0)
MCV: 96.8 fL (ref 80.0–100.0)
Platelets: 264 10*3/uL (ref 150–400)
RBC: 4.01 MIL/uL — ABNORMAL LOW (ref 4.22–5.81)
RDW: 13.1 % (ref 11.5–15.5)
WBC: 6.4 10*3/uL (ref 4.0–10.5)
nRBC: 0 % (ref 0.0–0.2)

## 2021-04-23 LAB — HEMOGLOBIN A1C
Hgb A1c MFr Bld: 6.3 % — ABNORMAL HIGH (ref 4.8–5.6)
Mean Plasma Glucose: 134.11 mg/dL

## 2021-04-23 LAB — BASIC METABOLIC PANEL
Anion gap: 6 (ref 5–15)
BUN: 19 mg/dL (ref 8–23)
CO2: 27 mmol/L (ref 22–32)
Calcium: 9 mg/dL (ref 8.9–10.3)
Chloride: 104 mmol/L (ref 98–111)
Creatinine, Ser: 0.79 mg/dL (ref 0.61–1.24)
GFR, Estimated: >60 mL/min (ref >60)
Glucose, Bld: 128 mg/dL — ABNORMAL HIGH (ref 70–99)
Potassium: 4 mmol/L (ref 3.5–5.1)
Sodium: 137 mmol/L (ref 135–145)

## 2021-04-23 LAB — SURGICAL PCR SCREEN
MRSA, PCR: NEGATIVE
Staphylococcus aureus: NEGATIVE

## 2021-04-23 LAB — GLUCOSE, CAPILLARY: Glucose-Capillary: 119 mg/dL — ABNORMAL HIGH (ref 70–99)

## 2021-04-23 NOTE — Progress Notes (Addendum)
COVID Vaccine Completed: Yes Date COVID Vaccine completed: 04/20/21 COVID vaccine manufacturer: [employer]. X 5     PCP - Dr. Wenda Low Cardiologist - Dr. Candee Furbish. LOV: 02/09/21  Chest x-ray -  EKG - 02/09/21 EPIC Stress Test -  ECHO -  Cardiac Cath -  Pacemaker/ICD device last checked:  Sleep Study -  CPAP -   Fasting Blood Sugar - 100's Checks Blood Sugar ___1__ times a week.  Blood Thinner Instructions: Xarelto will be held 4 days before surgery as per Dr. Lyla Glassing instructions. Aspirin Instructions: Last Dose:  Anesthesia review: Hx: DIA,Afib,HTN  Patient denies shortness of breath, fever, cough and chest pain at PAT appointment   Patient verbalized understanding of instructions that were given to them at the PAT appointment. Patient was also instructed that they will need to review over the PAT instructions again at home before surgery.

## 2021-04-26 ENCOUNTER — Other Ambulatory Visit: Payer: Self-pay | Admitting: Cardiology

## 2021-04-26 ENCOUNTER — Ambulatory Visit: Payer: Self-pay | Admitting: Student

## 2021-04-26 NOTE — Anesthesia Preprocedure Evaluation (Addendum)
Anesthesia Evaluation  Patient identified by MRN, date of birth, ID band Patient awake    Reviewed: Allergy & Precautions, NPO status , Patient's Chart, lab work & pertinent test results  Airway Mallampati: II  TM Distance: >3 FB Neck ROM: Full    Dental  (+) Teeth Intact   Pulmonary neg pulmonary ROS, former smoker,    Pulmonary exam normal        Cardiovascular hypertension, + dysrhythmias (on Xarelto, last dose 04/25/21) Atrial Fibrillation  Rhythm:Irregular Rate:Normal     Neuro/Psych negative neurological ROS  negative psych ROS   GI/Hepatic negative GI ROS, Neg liver ROS,   Endo/Other  diabetes, Type 2, Oral Hypoglycemic Agents  Renal/GU negative Renal ROS  negative genitourinary   Musculoskeletal  (+) Arthritis ,   Abdominal (+)  Abdomen: soft.    Peds  Hematology  (+) anemia ,   Anesthesia Other Findings   Reproductive/Obstetrics                            Anesthesia Physical Anesthesia Plan  ASA: 3  Anesthesia Plan: MAC and Spinal   Post-op Pain Management:    Induction:   PONV Risk Score and Plan: 1 and Ondansetron, Dexamethasone, Propofol infusion and Treatment may vary due to age or medical condition  Airway Management Planned: Simple Face Mask, Natural Airway and Nasal Cannula  Additional Equipment: None  Intra-op Plan:   Post-operative Plan:   Informed Consent: I have reviewed the patients History and Physical, chart, labs and discussed the procedure including the risks, benefits and alternatives for the proposed anesthesia with the patient or authorized representative who has indicated his/her understanding and acceptance.     Dental advisory given  Plan Discussed with:   Anesthesia Plan Comments: (See PAT note 04/23/2021, Konrad Felix Ward, PA-C)       Anesthesia Quick Evaluation

## 2021-04-26 NOTE — Progress Notes (Signed)
Anesthesia Chart Review   Case: [id number] Date/Time: 04/29/21 1315   Procedure: TOTAL HIP ARTHROPLASTY ANTERIOR APPROACH (Right: Hip)   Anesthesia type: Spinal   Pre-op diagnosis: Right hip osteoarthritis   Location: [facility] ROOM 08 / [facility]   Surgeons: Rod Can, MD       DISCUSSION:80 y.o. former smoker with h/o HTN, DM II (A1C 6.3), Atrial fibrillation (on Xarelto), right hip OA scheduled for above procedure 04/29/2021 with Dr. Rod Can.   Pt last seen by cardiology 02/09/2021, good rate control with diltiazem ER. Stable at this visit with 1 year follow up recommended.  Stress test in 2008 was low risk, normal EF on echocardiogram  Pt advised to hold Xarelto 3 days prior to surgery.   Anticipate pt can proceed with planned procedure barring acute status change.   VS: BP (!) 145/87   Pulse 90   Temp 37.1 C (Oral)   Resp 16   Ht 5\' 7"  (1.702 m)   Wt 68 kg   SpO2 100%   BMI 23.49 kg/m   PROVIDERS: Wenda Low, MD is PCP   Candee Furbish, MD is Cardiologist  LABS: Labs reviewed: Acceptable for surgery. (all labs ordered are listed, but only abnormal results are displayed)  Labs Reviewed  BASIC METABOLIC PANEL - Abnormal; Notable for the following components:      Result Value   Glucose, Bld 128 (*)    All other components within normal limits  CBC - Abnormal; Notable for the following components:   RBC 4.01 (*)    Hemoglobin 12.8 (*)    HCT 38.8 (*)    All other components within normal limits  HEMOGLOBIN A1C - Abnormal; Notable for the following components:   Hgb A1c MFr Bld 6.3 (*)    All other components within normal limits  GLUCOSE, CAPILLARY - Abnormal; Notable for the following components:   Glucose-Capillary 119 (*)    All other components within normal limits  SURGICAL PCR SCREEN     IMAGES:   EKG: 02/09/2021 Rate 63 bpm  Afib  CV:  Past Medical History:  Diagnosis Date   A-fib ([place])    Arthritis    Atrial fibrillation ([facility])     Diabetes mellitus    Dysrhythmia    ED (erectile dysfunction)    Epistaxis    Hyperlipidemia    Hypertension     Past Surgical History:  Procedure Laterality Date   APPENDECTOMY     NASAL MASS EXCISION     NASAL SINUS SURGERY  12/16/2011   Procedure: ENDOSCOPIC SINUS SURGERY;  Surgeon: Rozetta Nunnery, MD;  Location: [facility];  Service: ENT;  Laterality: N/A;  for control of epistaxis    MEDICATIONS:  atorvastatin (LIPITOR) 20 MG tablet   calcium citrate-vitamin D 200-200 MG-UNIT TABS   diltiazem (TIAZAC) 240 MG 24 hr capsule   fish oil-omega-3 fatty acids 1000 MG capsule   glimepiride (AMARYL) 2 MG tablet   Glucosamine-Chondroitin (OSTEO BI-FLEX REGULAR STRENGTH PO)   Multiple Vitamin (MULITIVITAMIN WITH MINERALS) TABS   rivaroxaban (XARELTO) 20 MG TABS tablet   No current facility-administered medications for this encounter.     Konrad Felix Ward, PA-C [facility] [phone]

## 2021-04-29 ENCOUNTER — Ambulatory Visit (HOSPITAL_COMMUNITY): Payer: Medicare HMO | Admitting: Physician Assistant

## 2021-04-29 ENCOUNTER — Ambulatory Visit (HOSPITAL_COMMUNITY): Payer: Medicare HMO

## 2021-04-29 ENCOUNTER — Observation Stay (HOSPITAL_COMMUNITY)
Admission: RE | Admit: 2021-04-29 | Discharge: 2021-04-30 | Disposition: A | Discharge status: Home / Self Care | Payer: Medicare HMO | Source: Ambulatory Visit | Attending: Orthopedic Surgery | Admitting: Orthopedic Surgery

## 2021-04-29 ENCOUNTER — Other Ambulatory Visit: Payer: Self-pay

## 2021-04-29 ENCOUNTER — Ambulatory Visit (HOSPITAL_COMMUNITY): Payer: Medicare HMO | Admitting: Certified Registered Nurse Anesthetist

## 2021-04-29 ENCOUNTER — Observation Stay (HOSPITAL_COMMUNITY): Payer: Medicare HMO

## 2021-04-29 ENCOUNTER — Encounter (HOSPITAL_COMMUNITY): Payer: Self-pay | Admitting: Orthopedic Surgery

## 2021-04-29 ENCOUNTER — Encounter (HOSPITAL_COMMUNITY)
Admission: RE | Disposition: A | Discharge status: Home / Self Care | Payer: Self-pay | Source: Ambulatory Visit | Attending: Orthopedic Surgery

## 2021-04-29 DIAGNOSIS — M1611 Unilateral primary osteoarthritis, right hip: Secondary | ICD-10-CM | POA: Diagnosis not present

## 2021-04-29 DIAGNOSIS — I10 Essential (primary) hypertension: Secondary | ICD-10-CM | POA: Insufficient documentation

## 2021-04-29 DIAGNOSIS — Z419 Encounter for procedure for purposes other than remedying health state, unspecified: Secondary | ICD-10-CM

## 2021-04-29 DIAGNOSIS — Z01818 Encounter for other preprocedural examination: Secondary | ICD-10-CM

## 2021-04-29 DIAGNOSIS — M7989 Other specified soft tissue disorders: Secondary | ICD-10-CM | POA: Diagnosis not present

## 2021-04-29 DIAGNOSIS — E785 Hyperlipidemia, unspecified: Secondary | ICD-10-CM | POA: Diagnosis not present

## 2021-04-29 DIAGNOSIS — I4891 Unspecified atrial fibrillation: Secondary | ICD-10-CM | POA: Insufficient documentation

## 2021-04-29 DIAGNOSIS — Z9889 Other specified postprocedural states: Secondary | ICD-10-CM | POA: Diagnosis not present

## 2021-04-29 DIAGNOSIS — Z96641 Presence of right artificial hip joint: Secondary | ICD-10-CM | POA: Diagnosis not present

## 2021-04-29 DIAGNOSIS — E119 Type 2 diabetes mellitus without complications: Secondary | ICD-10-CM | POA: Insufficient documentation

## 2021-04-29 DIAGNOSIS — Z20822 Contact with and (suspected) exposure to covid-19: Secondary | ICD-10-CM | POA: Insufficient documentation

## 2021-04-29 DIAGNOSIS — Z09 Encounter for follow-up examination after completed treatment for conditions other than malignant neoplasm: Secondary | ICD-10-CM

## 2021-04-29 DIAGNOSIS — Z471 Aftercare following joint replacement surgery: Secondary | ICD-10-CM | POA: Diagnosis not present

## 2021-04-29 DIAGNOSIS — Z87891 Personal history of nicotine dependence: Secondary | ICD-10-CM | POA: Insufficient documentation

## 2021-04-29 DIAGNOSIS — D649 Anemia, unspecified: Secondary | ICD-10-CM | POA: Diagnosis not present

## 2021-04-29 HISTORY — PX: TOTAL HIP ARTHROPLASTY: SHX124

## 2021-04-29 LAB — GLUCOSE, CAPILLARY
Glucose-Capillary: 132 mg/dL — ABNORMAL HIGH (ref 70–99)
Glucose-Capillary: 131 mg/dL — ABNORMAL HIGH (ref 70–99)
Glucose-Capillary: 216 mg/dL — ABNORMAL HIGH (ref 70–99)

## 2021-04-29 LAB — PROTIME-INR
INR: 1 (ref 0.8–1.2)
Prothrombin Time: 12.9 seconds (ref 11.4–15.2)

## 2021-04-29 LAB — SARS CORONAVIRUS 2 BY RT PCR (HOSPITAL ORDER, PERFORMED IN ~~LOC~~ HOSPITAL LAB): SARS Coronavirus 2: NEGATIVE

## 2021-04-29 LAB — TYPE AND SCREEN
ABO/RH(D): B POS
Antibody Screen: NEGATIVE

## 2021-04-29 SURGERY — ARTHROPLASTY, HIP, TOTAL, ANTERIOR APPROACH
Anesthesia: Monitor Anesthesia Care | Site: Hip | Laterality: Right

## 2021-04-29 MED ORDER — POVIDONE-IODINE 10 % EX SWAB
2.0000 "application " | Freq: Once | CUTANEOUS | Status: AC
  Administered 2021-04-29: 2 via TOPICAL

## 2021-04-29 MED ORDER — CHLORHEXIDINE GLUCONATE 0.12 % MT SOLN
15.0000 mL | Freq: Once | OROMUCOSAL | Status: AC
  Administered 2021-04-29: 15 mL via OROMUCOSAL

## 2021-04-29 MED ORDER — DEXAMETHASONE SODIUM PHOSPHATE 10 MG/ML IJ SOLN
INTRAMUSCULAR | Status: AC
  Filled 2021-04-29: qty 1

## 2021-04-29 MED ORDER — DILTIAZEM HCL ER BEADS 240 MG PO CP24: 240.0000 mg | ORAL_CAPSULE | Freq: Every day | ORAL | Status: DC

## 2021-04-29 MED ORDER — FENTANYL CITRATE PF 50 MCG/ML IJ SOSY: 25.0000 ug | PREFILLED_SYRINGE | INTRAMUSCULAR | Status: DC | PRN

## 2021-04-29 MED ORDER — PHENYLEPHRINE HCL-NACL 20-0.9 MG/250ML-% IV SOLN
INTRAVENOUS | Status: DC | PRN
  Administered 2021-04-29: 25 ug/min via INTRAVENOUS

## 2021-04-29 MED ORDER — SENNA 8.6 MG PO TABS
1.0000 | ORAL_TABLET | Freq: Two times a day (BID) | ORAL | Status: DC
  Administered 2021-04-30: 8.6 mg via ORAL
  Filled 2021-04-29: qty 1

## 2021-04-29 MED ORDER — PHENYLEPHRINE 40 MCG/ML (10ML) SYRINGE FOR IV PUSH (FOR BLOOD PRESSURE SUPPORT)
PREFILLED_SYRINGE | INTRAVENOUS | Status: AC
  Filled 2021-04-29: qty 10

## 2021-04-29 MED ORDER — POVIDONE-IODINE 10 % EX SWAB
2.0000 | Freq: Once | CUTANEOUS | Status: DC
Start: 2021-04-29 — End: 2021-04-29

## 2021-04-29 MED ORDER — METOCLOPRAMIDE HCL 5 MG PO TABS: 5.0000 mg | ORAL_TABLET | Freq: Three times a day (TID) | ORAL | Status: DC | PRN

## 2021-04-29 MED ORDER — ONDANSETRON HCL 4 MG/2ML IJ SOLN: 4.0000 mg | Freq: Four times a day (QID) | INTRAMUSCULAR | Status: DC | PRN

## 2021-04-29 MED ORDER — HYDROCODONE-ACETAMINOPHEN 5-325 MG PO TABS
1.0000 | ORAL_TABLET | ORAL | Status: DC | PRN
  Administered 2021-04-29 – 2021-04-30 (×3): 2 via ORAL
  Filled 2021-04-29: qty 1
  Filled 2021-04-29: qty 2
  Filled 2021-04-29: qty 1
  Filled 2021-04-29: qty 2

## 2021-04-29 MED ORDER — BUPIVACAINE IN DEXTROSE 0.75-8.25 % IT SOLN
INTRATHECAL | Status: DC | PRN
Start: 2021-04-29 — End: 2021-04-29
  Administered 2021-04-29: 1.8 mL via INTRATHECAL

## 2021-04-29 MED ORDER — WATER FOR IRRIGATION, STERILE IR SOLN
Status: DC | PRN
  Administered 2021-04-29: 2000 mL

## 2021-04-29 MED ORDER — HYDROCODONE-ACETAMINOPHEN 7.5-325 MG PO TABS: 1.0000 | ORAL_TABLET | ORAL | Status: DC | PRN

## 2021-04-29 MED ORDER — KETOROLAC TROMETHAMINE 30 MG/ML IJ SOLN
INTRAMUSCULAR | Status: DC | PRN
  Administered 2021-04-29: 30 mg

## 2021-04-29 MED ORDER — KETOROLAC TROMETHAMINE 30 MG/ML IJ SOLN
INTRAMUSCULAR | Status: AC
  Filled 2021-04-29: qty 1

## 2021-04-29 MED ORDER — MENTHOL 3 MG MT LOZG: 1.0000 | LOZENGE | OROMUCOSAL | Status: DC | PRN

## 2021-04-29 MED ORDER — ALUM & MAG HYDROXIDE-SIMETH 200-200-20 MG/5ML PO SUSP: 30.0000 mL | ORAL | Status: DC | PRN

## 2021-04-29 MED ORDER — POLYETHYLENE GLYCOL 3350 17 G PO PACK
17.0000 g | PACK | Freq: Every day | ORAL | Status: DC | PRN
Start: 2021-04-29 — End: 2021-04-30

## 2021-04-29 MED ORDER — PROPOFOL 1000 MG/100ML IV EMUL
INTRAVENOUS | Status: AC
  Filled 2021-04-29: qty 100

## 2021-04-29 MED ORDER — BUPIVACAINE-EPINEPHRINE 0.25% -1:200000 IJ SOLN
INTRAMUSCULAR | Status: DC | PRN
  Administered 2021-04-29: 30 mL

## 2021-04-29 MED ORDER — PHENOL 1.4 % MT LIQD: 1.0000 | OROMUCOSAL | Status: DC | PRN

## 2021-04-29 MED ORDER — ACETAMINOPHEN 325 MG PO TABS: 325.0000 mg | ORAL_TABLET | Freq: Four times a day (QID) | ORAL | Status: DC | PRN

## 2021-04-29 MED ORDER — LACTATED RINGERS IV SOLN: INTRAVENOUS | Status: DC

## 2021-04-29 MED ORDER — ONDANSETRON HCL 4 MG/2ML IJ SOLN
INTRAMUSCULAR | Status: AC
  Filled 2021-04-29: qty 2

## 2021-04-29 MED ORDER — SODIUM CHLORIDE 0.9 % IV SOLN
INTRAVENOUS | Status: DC
  Administered 2021-04-29: 1000 mL via INTRAVENOUS

## 2021-04-29 MED ORDER — INSULIN ASPART 100 UNIT/ML IJ SOLN
0.0000 [IU] | Freq: Three times a day (TID) | INTRAMUSCULAR | Status: DC
  Administered 2021-04-30: 2 [IU] via SUBCUTANEOUS
  Administered 2021-04-30: 1 [IU] via SUBCUTANEOUS

## 2021-04-29 MED ORDER — DEXAMETHASONE SODIUM PHOSPHATE 10 MG/ML IJ SOLN
10.0000 mg | Freq: Once | INTRAMUSCULAR | Status: AC
  Administered 2021-04-30: 10 mg via INTRAVENOUS
  Filled 2021-04-29: qty 1

## 2021-04-29 MED ORDER — EPHEDRINE 5 MG/ML INJ
INTRAVENOUS | Status: AC
  Filled 2021-04-29: qty 5

## 2021-04-29 MED ORDER — DIPHENHYDRAMINE HCL 12.5 MG/5ML PO ELIX
12.5000 mg | ORAL_SOLUTION | ORAL | Status: DC | PRN
  Administered 2021-04-30: 25 mg via ORAL
  Filled 2021-04-29: qty 10

## 2021-04-29 MED ORDER — ACETAMINOPHEN 10 MG/ML IV SOLN
1000.0000 mg | Freq: Once | INTRAVENOUS | Status: AC
  Administered 2021-04-29: 1000 mg via INTRAVENOUS
  Filled 2021-04-29: qty 100

## 2021-04-29 MED ORDER — ONDANSETRON HCL 4 MG PO TABS: 4.0000 mg | ORAL_TABLET | Freq: Four times a day (QID) | ORAL | Status: DC | PRN

## 2021-04-29 MED ORDER — ACETAMINOPHEN 10 MG/ML IV SOLN: 1000.0000 mg | Freq: Once | INTRAVENOUS | Status: DC | PRN

## 2021-04-29 MED ORDER — ISOPROPYL ALCOHOL 70 % SOLN
Status: AC
  Filled 2021-04-29: qty 480

## 2021-04-29 MED ORDER — PROPOFOL 500 MG/50ML IV EMUL
INTRAVENOUS | Status: DC | PRN
  Administered 2021-04-29: 100 ug/kg/min via INTRAVENOUS

## 2021-04-29 MED ORDER — ORAL CARE MOUTH RINSE: 15.0000 mL | Freq: Once | OROMUCOSAL | Status: AC

## 2021-04-29 MED ORDER — FENTANYL CITRATE (PF) 100 MCG/2ML IJ SOLN
INTRAMUSCULAR | Status: AC
  Filled 2021-04-29: qty 2

## 2021-04-29 MED ORDER — CEFAZOLIN SODIUM-DEXTROSE 2-4 GM/100ML-% IV SOLN
2.0000 g | INTRAVENOUS | Status: AC
  Administered 2021-04-29: 2 g via INTRAVENOUS
  Filled 2021-04-29: qty 100

## 2021-04-29 MED ORDER — FENTANYL CITRATE (PF) 100 MCG/2ML IJ SOLN
INTRAMUSCULAR | Status: DC | PRN
  Administered 2021-04-29: 100 ug via INTRAVENOUS

## 2021-04-29 MED ORDER — BUPIVACAINE-EPINEPHRINE (PF) 0.25% -1:200000 IJ SOLN
INTRAMUSCULAR | Status: AC
  Filled 2021-04-29: qty 30

## 2021-04-29 MED ORDER — TRANEXAMIC ACID-NACL 1000-0.7 MG/100ML-% IV SOLN
1000.0000 mg | INTRAVENOUS | Status: AC
  Administered 2021-04-29: 1000 mg via INTRAVENOUS
  Filled 2021-04-29: qty 100

## 2021-04-29 MED ORDER — PROPOFOL 10 MG/ML IV BOLUS
INTRAVENOUS | Status: DC | PRN
  Administered 2021-04-29: 20 mg via INTRAVENOUS

## 2021-04-29 MED ORDER — INSULIN ASPART 100 UNIT/ML IJ SOLN
0.0000 [IU] | Freq: Every day | INTRAMUSCULAR | Status: DC
  Administered 2021-04-29: 2 [IU] via SUBCUTANEOUS

## 2021-04-29 MED ORDER — ATORVASTATIN CALCIUM 20 MG PO TABS
20.0000 mg | ORAL_TABLET | Freq: Every day | ORAL | Status: DC
  Administered 2021-04-29: 20 mg via ORAL
  Filled 2021-04-29 (×2): qty 1

## 2021-04-29 MED ORDER — SODIUM CHLORIDE 0.9 % IV SOLN: INTRAVENOUS | Status: DC

## 2021-04-29 MED ORDER — DOCUSATE SODIUM 100 MG PO CAPS
100.0000 mg | ORAL_CAPSULE | Freq: Two times a day (BID) | ORAL | Status: DC
  Administered 2021-04-29 – 2021-04-30 (×2): 100 mg via ORAL
  Filled 2021-04-29 (×2): qty 1

## 2021-04-29 MED ORDER — SODIUM CHLORIDE 0.9 % IR SOLN
Status: DC | PRN
  Administered 2021-04-29 (×2): 1000 mL

## 2021-04-29 MED ORDER — GLIMEPIRIDE 1 MG PO TABS
1.0000 mg | ORAL_TABLET | Freq: Every day | ORAL | Status: DC
  Administered 2021-04-30: 1 mg via ORAL
  Filled 2021-04-29: qty 1

## 2021-04-29 MED ORDER — ENSURE ENLIVE PO LIQD: 237.0000 mL | Freq: Two times a day (BID) | ORAL | Status: DC

## 2021-04-29 MED ORDER — RIVAROXABAN 10 MG PO TABS
10.0000 mg | ORAL_TABLET | Freq: Every day | ORAL | Status: DC
  Administered 2021-04-30: 10 mg via ORAL
  Filled 2021-04-29: qty 1

## 2021-04-29 MED ORDER — SODIUM CHLORIDE (PF) 0.9 % IJ SOLN
INTRAMUSCULAR | Status: DC | PRN
  Administered 2021-04-29: 30 mL

## 2021-04-29 MED ORDER — SODIUM CHLORIDE (PF) 0.9 % IJ SOLN
INTRAMUSCULAR | Status: AC
  Filled 2021-04-29: qty 30

## 2021-04-29 MED ORDER — DILTIAZEM HCL ER COATED BEADS 240 MG PO CP24
240.0000 mg | ORAL_CAPSULE | Freq: Every day | ORAL | Status: DC
  Administered 2021-04-30: 240 mg via ORAL
  Filled 2021-04-29: qty 1

## 2021-04-29 MED ORDER — MORPHINE SULFATE (PF) 2 MG/ML IV SOLN: 0.5000 mg | INTRAVENOUS | Status: DC | PRN

## 2021-04-29 MED ORDER — TRANEXAMIC ACID-NACL 1000-0.7 MG/100ML-% IV SOLN
1000.0000 mg | Freq: Once | INTRAVENOUS | Status: AC
  Administered 2021-04-29: 1000 mg via INTRAVENOUS
  Filled 2021-04-29: qty 100

## 2021-04-29 MED ORDER — 0.9 % SODIUM CHLORIDE (POUR BTL) OPTIME
TOPICAL | Status: DC | PRN
  Administered 2021-04-29: 1000 mL

## 2021-04-29 MED ORDER — DEXAMETHASONE SODIUM PHOSPHATE 10 MG/ML IJ SOLN
INTRAMUSCULAR | Status: DC | PRN
  Administered 2021-04-29: 5 mg via INTRAVENOUS

## 2021-04-29 MED ORDER — ISOPROPYL ALCOHOL 70 % SOLN
Status: DC | PRN
  Administered 2021-04-29: 1 via TOPICAL

## 2021-04-29 MED ORDER — CEFAZOLIN SODIUM-DEXTROSE 2-4 GM/100ML-% IV SOLN
2.0000 g | Freq: Four times a day (QID) | INTRAVENOUS | Status: AC
Start: 2021-04-29 — End: 2021-04-30
  Administered 2021-04-29 – 2021-04-30 (×2): 2 g via INTRAVENOUS
  Filled 2021-04-29 (×2): qty 100

## 2021-04-29 MED ORDER — ONDANSETRON HCL 4 MG/2ML IJ SOLN
INTRAMUSCULAR | Status: DC | PRN
  Administered 2021-04-29: 4 mg via INTRAVENOUS

## 2021-04-29 MED ORDER — METOCLOPRAMIDE HCL 5 MG/ML IJ SOLN: 5.0000 mg | Freq: Three times a day (TID) | INTRAMUSCULAR | Status: DC | PRN

## 2021-04-29 MED ORDER — COLCHICINE 0.6 MG PO TABS
0.6000 mg | ORAL_TABLET | Freq: Two times a day (BID) | ORAL | Status: DC
  Administered 2021-04-29 – 2021-04-30 (×2): 0.6 mg via ORAL
  Filled 2021-04-29 (×4): qty 1

## 2021-04-29 SURGICAL SUPPLY — 64 items
ACETAB CUP W/GRIPTION 54 (Plate) ×2 IMPLANT
ADH SKN CLS APL DERMABOND .7 (GAUZE/BANDAGES/DRESSINGS) ×2
APL PRP STRL LF DISP 70% ISPRP (MISCELLANEOUS) ×1
BAG COUNTER SPONGE SURGICOUNT (BAG) IMPLANT
BAG DECANTER FOR FLEXI CONT (MISCELLANEOUS) IMPLANT
BAG SPEC THK2 15X12 ZIP CLS (MISCELLANEOUS)
BAG SPNG CNTER NS LX DISP (BAG)
BAG ZIPLOCK 12X15 (MISCELLANEOUS) IMPLANT
BLADE SURG SZ10 CARB STEEL (BLADE) IMPLANT
CHLORAPREP W/TINT 26 (MISCELLANEOUS) ×2 IMPLANT
COVER PERINEAL POST (MISCELLANEOUS) ×2 IMPLANT
COVER SURGICAL LIGHT HANDLE (MISCELLANEOUS) ×2 IMPLANT
CUP ACETAB W/GRIPTION 54 (Plate) IMPLANT
DECANTER SPIKE VIAL GLASS SM (MISCELLANEOUS) ×2 IMPLANT
DERMABOND ADVANCED (GAUZE/BANDAGES/DRESSINGS) ×2
DERMABOND ADVANCED .7 DNX12 (GAUZE/BANDAGES/DRESSINGS) ×2 IMPLANT
DRAPE IMP U-DRAPE 54X76 (DRAPES) ×2 IMPLANT
DRAPE SHEET LG 3/4 BI-LAMINATE (DRAPES) ×6 IMPLANT
DRAPE STERI IOBAN 125X83 (DRAPES) IMPLANT
DRAPE U-SHAPE 47X51 STRL (DRAPES) ×4 IMPLANT
DRSG AQUACEL AG ADV 3.5X10 (GAUZE/BANDAGES/DRESSINGS) ×2 IMPLANT
ELECT REM PT RETURN 15FT ADLT (MISCELLANEOUS) ×2 IMPLANT
GAUZE SPONGE 4X4 12PLY STRL (GAUZE/BANDAGES/DRESSINGS) ×2 IMPLANT
GLOVE SRG 8 PF TXTR STRL LF DI (GLOVE) ×1 IMPLANT
GLOVE SURG ENC MOIS LTX SZ8.5 (GLOVE) ×4 IMPLANT
GLOVE SURG ENC TEXT LTX SZ7.5 (GLOVE) ×4 IMPLANT
GLOVE SURG UNDER POLY LF SZ8 (GLOVE) ×2
GLOVE SURG UNDER POLY LF SZ8.5 (GLOVE) ×2 IMPLANT
GOWN SPEC L3 XXLG W/TWL (GOWN DISPOSABLE) ×2 IMPLANT
GOWN STRL REUS W/TWL XL LVL3 (GOWN DISPOSABLE) ×2 IMPLANT
HANDPIECE INTERPULSE COAX TIP (DISPOSABLE) ×2
HEAD M SROM 36MM PLUS 1.5 (Hips) IMPLANT
HOLDER FOLEY CATH W/STRAP (MISCELLANEOUS) ×2 IMPLANT
HOOD PEEL AWAY FLYTE STAYCOOL (MISCELLANEOUS) ×8 IMPLANT
JET LAVAGE IRRISEPT WOUND (IRRIGATION / IRRIGATOR)
KIT TURNOVER KIT A (KITS) ×2 IMPLANT
LAVAGE JET IRRISEPT WOUND (IRRIGATION / IRRIGATOR) IMPLANT
LINER NEUTRAL 36ID 54OD (Liner) ×1 IMPLANT
MANIFOLD NEPTUNE II (INSTRUMENTS) ×2 IMPLANT
MARKER SKIN DUAL TIP RULER LAB (MISCELLANEOUS) ×2 IMPLANT
NDL SAFETY ECLIPSE 18X1.5 (NEEDLE) ×1 IMPLANT
NDL SPNL 18GX3.5 QUINCKE PK (NEEDLE) ×1 IMPLANT
NEEDLE HYPO 18GX1.5 SHARP (NEEDLE) ×2
NEEDLE SPNL 18GX3.5 QUINCKE PK (NEEDLE) ×2 IMPLANT
PACK ANTERIOR HIP CUSTOM (KITS) ×2 IMPLANT
PENCIL SMOKE EVACUATOR (MISCELLANEOUS) IMPLANT
SAW OSC TIP CART 19.5X105X1.3 (SAW) ×2 IMPLANT
SEALER BIPOLAR AQUA 6.0 (INSTRUMENTS) ×2 IMPLANT
SET HNDPC FAN SPRY TIP SCT (DISPOSABLE) ×1 IMPLANT
SROM M HEAD 36MM PLUS 1.5 (Hips) ×2 IMPLANT
STEM TRI LOC BPS SZ7 W GRIPTON (Hips) IMPLANT
SUT MNCRL AB 3-0 PS2 18 (SUTURE) ×2 IMPLANT
SUT MNCRL AB 4-0 PS2 18 (SUTURE) ×2 IMPLANT
SUT MON AB 2-0 CT1 36 (SUTURE) ×4 IMPLANT
SUT STRATAFIX PDO 1 14 VIOLET (SUTURE) ×2
SUT STRATFX PDO 1 14 VIOLET (SUTURE) ×1
SUT VIC AB 2-0 CT1 27 (SUTURE) ×2
SUT VIC AB 2-0 CT1 TAPERPNT 27 (SUTURE) ×1 IMPLANT
SUTURE STRATFX PDO 1 14 VIOLET (SUTURE) ×1 IMPLANT
SYR 3ML LL SCALE MARK (SYRINGE) ×2 IMPLANT
TRAY FOLEY MTR SLVR 16FR STAT (SET/KITS/TRAYS/PACK) IMPLANT
TRI LOC BPS SZ 7 W GRIPTON (Hips) ×2 IMPLANT
TUBE SUCTION HIGH CAP CLEAR NV (SUCTIONS) ×2 IMPLANT
WATER STERILE IRR 1000ML POUR (IV SOLUTION) ×2 IMPLANT

## 2021-04-29 NOTE — Anesthesia Procedure Notes (Signed)
Spinal  Patient location during procedure: OR Start time: 04/29/2021 12:53 PM End time: 04/29/2021 12:55 PM Staffing Performed: anesthesiologist  Anesthesiologist: Darral Dash, DO Preanesthetic Checklist Completed: patient identified, IV checked, site marked, risks and benefits discussed, surgical consent, monitors and equipment checked, pre-op evaluation and timeout performed Spinal Block Patient position: sitting Prep: DuraPrep Patient monitoring: heart rate, cardiac monitor, continuous pulse ox and blood pressure Approach: midline Location: L4-5 Injection technique: single-shot Needle Needle type: Pencan  Needle gauge: 24 G Needle length: 10 cm Assessment Events: CSF return Additional Notes Patient identified. Risks/Benefits/Options discussed with patient including but not limited to bleeding, infection, nerve damage, paralysis, failed block, incomplete pain control, headache, blood pressure changes, nausea, vomiting, reactions to medications, itching and postpartum back pain. Confirmed with bedside nurse the patient's most recent platelet count. Confirmed with patient that they are not currently taking any anticoagulation, have any bleeding history or any family history of bleeding disorders. Patient expressed understanding and wished to proceed. All questions were answered. Sterile technique was used throughout the entire procedure. Please see nursing notes for vital signs. Warning signs of high block given to the patient including shortness of breath, tingling/numbness in hands, complete motor block, or any concerning symptoms with instructions to call for help. Patient was given instructions on fall risk and not to get out of bed. All questions and concerns addressed with instructions to call with any issues or inadequate analgesia.

## 2021-04-29 NOTE — Discharge Instructions (Signed)
? ?Dr. Khamari Yousuf ?Joint Replacement Specialist ?[facility] ?[address] ?[place],  [address] ?[phone] ? ? ?TOTAL HIP REPLACEMENT POSTOPERATIVE DIRECTIONS ? ? ? ?Hip Rehabilitation, Guidelines Following Surgery  ? ?WEIGHT BEARING ?Weight bearing as tolerated with assist device (walker, cane, etc) as directed, use it as long as suggested by your surgeon or therapist, typically at least 4-6 weeks. ? ?The results of a hip operation are greatly improved after range of motion and muscle strengthening exercises. Follow all safety measures which are given to protect your hip. If any of these exercises cause increased pain or swelling in your joint, decrease the amount until you are comfortable again. Then slowly increase the exercises. Call your caregiver if you have problems or questions.  ? ?HOME CARE INSTRUCTIONS  ?Most of the following instructions are designed to prevent the dislocation of your new hip.  ?Remove items at home which could result in a fall. This includes throw rugs or furniture in walking pathways.  ?Continue medications as instructed at time of discharge. ?You may have some home medications which will be placed on hold until you complete the course of blood thinner medication. ?You may start showering once you are discharged home. Do not remove your dressing. ?Do not put on socks or shoes without following the instructions of your caregivers.   ?Sit on chairs with arms. Use the chair arms to help push yourself up when arising.  ?Arrange for the use of a toilet seat elevator so you are not sitting low.  ?Walk with walker as instructed.  ?You may resume a sexual relationship in one month or when given the OK by your caregiver.  ?Use walker as long as suggested by your caregivers.  ?You may put full weight on your legs and walk as much as is comfortable. ?Avoid periods of inactivity such as sitting longer than an hour when not asleep. This helps prevent blood  clots.  ?You may return to work once you are cleared by your surgeon.  ?Do not drive a car for 6 weeks or until released by your surgeon.  ?Do not drive while taking narcotics.  ?Wear elastic stockings for two weeks following surgery during the day but you may remove then at night.  ?Make sure you keep all of your appointments after your operation with all of your doctors and caregivers. You should call the office at the above phone number and make an appointment for approximately two weeks after the date of your surgery. ?Please pick up a stool softener and laxative for home use as long as you are requiring pain medications. ?ICE to the affected hip every three hours for 30 minutes at a time and then as needed for pain and swelling. Continue to use ice on the hip for pain and swelling from surgery. You may notice swelling that will progress down to the foot and ankle.  This is normal after surgery.  Elevate the leg when you are not up walking on it.   ?It is important for you to complete the blood thinner medication as prescribed by your doctor. ?Continue to use the breathing machine which will help keep your temperature down.  It is common for your temperature to cycle up and down following surgery, especially at night when you are not up moving around and exerting yourself.  The breathing machine keeps your lungs expanded and your temperature down. ? ?RANGE OF MOTION AND STRENGTHENING EXERCISES  ?These exercises are designed to help you   keep full movement of your hip joint. Follow your caregiver's or physical therapist's instructions. Perform all exercises about fifteen times, three times per day or as directed. Exercise both hips, even if you have had only one joint replacement. These exercises can be done on a training (exercise) mat, on the floor, on a table or on a bed. Use whatever works the best and is most comfortable for you. Use music or television while you are exercising so that the exercises are a  pleasant break in your day. This will make your life better with the exercises acting as a break in routine you can look forward to.  ?Lying on your back, slowly slide your foot toward your buttocks, raising your knee up off the floor. Then slowly slide your foot back down until your leg is straight again.  ?Lying on your back spread your legs as far apart as you can without causing discomfort.  ?Lying on your side, raise your upper leg and foot straight up from the floor as far as is comfortable. Slowly lower the leg and repeat.  ?Lying on your back, tighten up the muscle in the front of your thigh (quadriceps muscles). You can do this by keeping your leg straight and trying to raise your heel off the floor. This helps strengthen the largest muscle supporting your knee.  ?Lying on your back, tighten up the muscles of your buttocks both with the legs straight and with the knee bent at a comfortable angle while keeping your heel on the floor.  ? ?SKILLED REHAB INSTRUCTIONS: ?If the patient is transferred to a skilled rehab facility following release from the hospital, a list of the current medications will be sent to the facility for the patient to continue.  When discharged from the skilled rehab facility, please have the facility set up the patient's Home Health Physical Therapy prior to being released. Also, the skilled facility will be responsible for providing the patient with their medications at time of release from the facility to include their pain medication and their blood thinner medication. If the patient is still at the rehab facility at time of the two week follow up appointment, the skilled rehab facility will also need to assist the patient in arranging follow up appointment in our office and any transportation needs. ? ?POST-OPERATIVE OPIOID TAPER INSTRUCTIONS: ?It is important to wean off of your opioid medication as soon as possible. If you do not need pain medication after your surgery it is ok  to stop day one. ?Opioids include: ?Codeine, Hydrocodone(Norco, Vicodin), Oxycodone(Percocet, oxycontin) and hydromorphone amongst others.  ?Long term and even short term use of opiods can cause: ?Increased pain response ?Dependence ?Constipation ?Depression ?Respiratory depression ?And more.  ?Withdrawal symptoms can include ?Flu like symptoms ?Nausea, vomiting ?And more ?Techniques to manage these symptoms ?Hydrate well ?Eat regular healthy meals ?Stay active ?Use relaxation techniques(deep breathing, meditating, yoga) ?Do Not substitute Alcohol to help with tapering ?If you have been on opioids for less than two weeks and do not have pain than it is ok to stop all together.  ?Plan to wean off of opioids ?This plan should start within one week post op of your joint replacement. ?Maintain the same interval or time between taking each dose and first decrease the dose.  ?Cut the total daily intake of opioids by one tablet each day ?Next start to increase the time between doses. ?The last dose that should be eliminated is the evening dose.  ? ? ?MAKE   SURE YOU:  ?Understand these instructions.  ?Will watch your condition.  ?Will get help right away if you are not doing well or get worse. ? ?Pick up stool softner and laxative for home use following surgery while on pain medications. ?Do not remove your dressing. ?The dressing is waterproof--it is OK to take showers. ?Continue to use ice for pain and swelling after surgery. ?Do not use any lotions or creams on the incision until instructed by your surgeon. ?Total Hip Protocol. ? ?

## 2021-04-29 NOTE — Transfer of Care (Signed)
Immediate Anesthesia Transfer of Care Note  Patient: Robert Irwin  Procedure(s) Performed: Procedure(s): TOTAL HIP ARTHROPLASTY ANTERIOR APPROACH (Right)  Patient Location: PACU  Anesthesia Type:Spinal  Level of Consciousness: awake, alert  and oriented  Airway & Oxygen Therapy: Patient Spontanous Breathing  Post-op Assessment: Report given to RN and Post -op Vital signs reviewed and stable  Post vital signs: Reviewed and stable  Last Vitals:  Vitals:   04/29/21 1045  BP: 140/84  Pulse: 99  Resp: 16  Temp: 36.9 C  SpO2: 27%    Complications: No apparent anesthesia complications

## 2021-04-29 NOTE — H&P (Signed)
TOTAL HIP ADMISSION H&P  Patient is admitted for right total hip arthroplasty.  Subjective:  Chief Complaint: right hip pain  HPI: Manson Passey, 80 y.o. male, has a history of pain and functional disability in the right hip(s) due to arthritis and subchondral fracture  and patient has failed non-surgical conservative treatments for greater than 12 weeks to include NSAID's and/or analgesics, flexibility and strengthening excercises, supervised PT with diminished ADL's post treatment, use of assistive devices, and activity modification.  Onset of symptoms was abrupt starting 1 years ago with rapidlly worsening course since that time.The patient noted no past surgery on the right hip(s).  Patient currently rates pain in the right hip at 10 out of 10 with activity. Patient has night pain, worsening of pain with activity and weight bearing, pain that interfers with activities of daily living, and pain with passive range of motion. Patient has evidence of subchondral cysts, subchondral sclerosis, periarticular osteophytes, joint space narrowing, and subchondral fracture with collapse  by imaging studies. This condition presents safety issues increasing the risk of falls. There is no current active infection.  Patient Active Problem List   Diagnosis Date Noted   Encounter for therapeutic drug monitoring 08/22/2013   Atrial fibrillation ([place]) 05/10/2013   Anemia 12/16/2011   Epistaxis 12/14/2011   Hyperlipidemia 12/14/2011   Diabetes mellitus ([place]) 12/14/2011   Diabetes mellitus with coincident hypertension ([place]) 12/14/2011   Atrial fibrillation with RVR ([place]) 12/14/2011   Past Medical History:  Diagnosis Date   A-fib ([place])    Arthritis    Atrial fibrillation ([place])    Diabetes mellitus    Dysrhythmia    ED (erectile dysfunction)    Epistaxis    Hyperlipidemia    Hypertension     Past Surgical History:  Procedure Laterality Date   APPENDECTOMY     NASAL MASS EXCISION     NASAL SINUS SURGERY   12/16/2011   Procedure: ENDOSCOPIC SINUS SURGERY;  Surgeon: Rozetta Nunnery, MD;  Location: [facility];  Service: ENT;  Laterality: N/A;  for control of epistaxis    Current Facility-Administered Medications  Medication Dose Route Frequency Provider Last Rate Last Admin   0.9 %  sodium chloride infusion   Intravenous Continuous Cherlynn June B, PA       acetaminophen (OFIRMEV) IV 1,000 mg  1,000 mg Intravenous Once McCauley, Larry B, PA       ceFAZolin (ANCEF) IVPB 2g/100 mL premix  2 g Intravenous On Call to OR Cherlynn June B, PA       lactated ringers infusion   Intravenous Continuous Josephine Igo, MD 10 mL/hr at 04/29/21 1054 New Bag at 04/29/21 1054   povidone-iodine 10 % swab 2 application  2 application Topical Once McCauley, Larry B, PA       tranexamic acid (CYKLOKAPRON) IVPB 1,000 mg  1,000 mg Intravenous To OR Cherlynn June B, PA       No Known Allergies  Social History   Tobacco Use   Smoking status: Former    Types: Cigarettes    Quit date: 07/25/1990    Years since quitting: 30.7   Smokeless tobacco: Never  Substance Use Topics   Alcohol use: Not Currently    History reviewed. No pertinent family history.   Review of Systems  Constitutional: Negative.   HENT: Negative.    Eyes: Negative.   Respiratory: Negative.    Cardiovascular: Negative.   Gastrointestinal: Negative.   Endocrine: Negative.   Genitourinary: Negative.  Musculoskeletal:  Positive for arthralgias and gait problem.  Skin: Negative.   Allergic/Immunologic: Negative.   Hematological: Negative.   Psychiatric/Behavioral: Negative.     Objective:  Physical Exam Vitals reviewed.  Constitutional:      Appearance: Normal appearance.  HENT:     Head: Normocephalic and atraumatic.     Nose: Nose normal.     Mouth/Throat:     Mouth: Mucous membranes are dry.     Pharynx: Oropharynx is clear.  Eyes:     Extraocular Movements: Extraocular movements intact.     Conjunctiva/sclera:  Conjunctivae normal.     Pupils: Pupils are equal, round, and reactive to light.  Cardiovascular:     Rate and Rhythm: Normal rate. Rhythm irregular.     Pulses: Normal pulses.  Pulmonary:     Effort: Pulmonary effort is normal.  Abdominal:     General: Abdomen is flat.     Palpations: Abdomen is soft.  Genitourinary:    Comments: deferred Musculoskeletal:     Cervical back: Normal range of motion and neck supple.     Right hip: Bony tenderness present. Decreased range of motion. Decreased strength.  Skin:    General: Skin is warm and dry.  Neurological:     General: No focal deficit present.     Mental Status: He is alert and oriented to person, place, and time.  Psychiatric:        Mood and Affect: Mood normal.        Behavior: Behavior normal.        Thought Content: Thought content normal.        Judgment: Judgment normal.   Woke up this am with swollen R foot. Small area of hyperemia that resolves with elevation. Xray negative. Likely gout.  Vital signs in last 24 hours: Temp:  [98.5 F (36.9 C)] 98.5 F (36.9 C) (10/06 1045) Pulse Rate:  [99] 99 (10/06 1045) Resp:  [16] 16 (10/06 1045) BP: (140)/(84) 140/84 (10/06 1045) SpO2:  [98 %] 98 % (10/06 1045) Weight:  [68 kg] 68 kg (10/06 1052)  Labs:   Estimated body mass index is 23.49 kg/m as calculated from the following:   Height as of 04/23/21: 5\' 7"  (1.702 m).   Weight as of this encounter: 68 kg.   Imaging Review Plain radiographs demonstrate severe degenerative joint disease of the right hip(s). The bone quality appears to be adequate for age and reported activity level.      Assessment/Plan:  End stage arthritis, right hip(s)  The patient history, physical examination, clinical judgement of the provider and imaging studies are consistent with end stage degenerative joint disease of the right hip(s) and total hip arthroplasty is deemed medically necessary. The treatment options including medical  management, injection therapy, arthroscopy and arthroplasty were discussed at length. The risks and benefits of total hip arthroplasty were presented and reviewed. The risks due to aseptic loosening, infection, stiffness, dislocation/subluxation,  thromboembolic complications and other imponderables were discussed.  The patient acknowledged the explanation, agreed to proceed with the plan and consent was signed. Patient is being admitted for inpatient treatment for surgery, pain control, PT, OT, prophylactic antibiotics, VTE prophylaxis, progressive ambulation and ADL's and discharge planning.The patient is planning to be discharged  home with HEP   Anticipated LOS equal to or greater than 2 midnights due to - Age 89 and older with one or more of the following:  - Obesity  - Expected need for hospital services (PT, OT, Nursing)  required for safe  discharge  - Anticipated need for postoperative skilled nursing care or inpatient rehab  - Active co-morbidities: Cardiac Arrhythmia OR   - Unanticipated findings during/Post Surgery: gout attack right foot   - Patient is a high risk of re-admission due to: None

## 2021-04-29 NOTE — Plan of Care (Signed)

## 2021-04-29 NOTE — Progress Notes (Addendum)
Pt c/o right foot swelling/redness after waking up this morning. Pt denies any injury to area. Slight redness and swelling noted to right foot. No obvious deformity or warmth to foot noted. + 2  dorsalis pedis pulse . Dr. Lyla Glassing at bedside and evaluating patient. Per Dr. Lyla Glassing , xray ordered ; no further orders at this time

## 2021-04-29 NOTE — Anesthesia Postprocedure Evaluation (Signed)
Anesthesia Post Note  Patient: Robert Irwin  Procedure(s) Performed: TOTAL HIP ARTHROPLASTY ANTERIOR APPROACH (Right: Hip)     Patient location during evaluation: PACU Anesthesia Type: MAC Level of consciousness: awake and alert and oriented Pain management: pain level controlled Vital Signs Assessment: post-procedure vital signs reviewed and stable Respiratory status: spontaneous breathing, nonlabored ventilation and respiratory function stable Cardiovascular status: stable and blood pressure returned to baseline Postop Assessment: no apparent nausea or vomiting, spinal receding and patient able to bend at knees Anesthetic complications: no   No notable events documented.  Last Vitals:  Vitals:   04/29/21 1545 04/29/21 1600  BP: (!) 105/59 (!) 100/59  Pulse: 70 68  Resp: 19 16  Temp:    SpO2: 100% 100%    Last Pain:  Vitals:   04/29/21 1530  TempSrc:   PainSc: 0-No pain                 Janella Rogala A.

## 2021-04-29 NOTE — Care Plan (Signed)
Ortho Bundle Case Management Note  Patient Details  Name: Robert Irwin MRN: [id number] Date of Birth: 11-03-1940  R THA on 04-29-21 DCP:  Home with wife and dtr.  2 story home with 4 ste. DME:  RW ordered through [facility] PT:  HEP                   DME Arranged:  Walker rolling DME Agency:  [employer]  HH Arranged:  NA [place] Agency:  NA  Additional Comments: Please contact me with any questions of if this plan should need to change.  Marianne Sofia, RN,CCM [facility]  [phone] 04/29/2021, 12:10 PM

## 2021-04-29 NOTE — Op Note (Signed)
OPERATIVE REPORT  SURGEON: Rod Can, MD   ASSISTANT: Cherlynn June, PA-C.  PREOPERATIVE DIAGNOSIS: Right hip arthritis.   POSTOPERATIVE DIAGNOSIS: Right hip arthritis.   PROCEDURE: Right total hip arthroplasty, anterior approach.   IMPLANTS: DePuy Tri Lock stem, size 7, hi offset. DePuy Pinnacle Cup, size 54 mm. DePuy Altrx liner, size 36 by 54 mm, neutral. DePuy metal head ball, size 36 + 1.5 mm.  ANESTHESIA:  MAC and Spinal  ESTIMATED BLOOD LOSS:-200 mL    ANTIBIOTICS: 2g ancef.  DRAINS: None.  COMPLICATIONS: None.   CONDITION: PACU - hemodynamically stable.   BRIEF CLINICAL NOTE: Robert Irwin is a 80 y.o. male with a long-standing history of Right hip arthritis. After failing conservative management, the patient was indicated for total hip arthroplasty. The risks, benefits, and alternatives to the procedure were explained, and the patient elected to proceed.  PROCEDURE IN DETAIL: Surgical site was marked by myself in the pre-op holding area. Once inside the operating room, spinal anesthesia was obtained, and a foley catheter was inserted. The patient was then positioned on the Hana table.  All bony prominences were well padded.  The hip was prepped and draped in the normal sterile surgical fashion.  A time-out was called verifying side and site of surgery. The patient received IV antibiotics within 60 minutes of beginning the procedure.   The direct anterior approach to the hip was performed through the Hueter interval.  Lateral femoral circumflex vessels were treated with the Auqumantys. The anterior capsule was exposed and an inverted T capsulotomy was made. The femoral neck cut was made to the level of the templated cut.  A corkscrew was placed into the head and the head was removed.  The femoral head was found to have eburnated bone. The head was passed to the back table and was measured.   Acetabular exposure was achieved, and the pulvinar and labrum were  excised. Sequential reaming of the acetabulum was then performed up to a size 53 mm reamer. A 54 mm cup was then opened and impacted into place at approximately 40 degrees of abduction and 20 degrees of anteversion. The final polyethylene liner was impacted into place and acetabular osteophytes were removed.    I then gained femoral exposure taking care to protect the abductors and greater trochanter.  This was performed using standard external rotation, extension, and adduction.  The capsule was peeled off the inner aspect of the greater trochanter, taking care to preserve the short external rotators. A cookie cutter was used to enter the femoral canal, and then the femoral canal finder was placed.  Sequential broaching was performed up to a size 7.  Calcar planer was used on the femoral neck remnant.  I placed a hi offset neck and a trial head ball.  The hip was reduced.  Leg lengths and offset were checked fluoroscopically.  The hip was dislocated and trial components were removed.  The final implants were placed, and the hip was reduced.  Fluoroscopy was used to confirm component position and leg lengths.  At 90 degrees of external rotation and full extension, the hip was stable to an anterior directed force.   The wound was copiously irrigated with Irrisept solution and normal saline using pule lavage.  Marcaine solution was injected into the periarticular soft tissue.  The wound was closed in layers using #1 Stratafix for the fascia, 2-0 Vicryl for the subcutaneous fat, 2-0 Monocryl for the deep dermal layer, 3-0 running Monocryl subcuticular stitch, and  Dermabond for the skin.  Once the glue was fully dried, an Aquacell Ag dressing was applied.  The patient was transported to the recovery room in stable condition.  Sponge, needle, and instrument counts were correct at the end of the case x2.  The patient tolerated the procedure well and there were no known complications.  Please note that a surgical  assistant was a medical necessity for this procedure to perform it in a safe and expeditious manner. Assistant was necessary to provide appropriate retraction of vital neurovascular structures, to prevent femoral fracture, and to allow for anatomic placement of the prosthesis.

## 2021-04-30 DIAGNOSIS — M1611 Unilateral primary osteoarthritis, right hip: Secondary | ICD-10-CM | POA: Diagnosis not present

## 2021-04-30 DIAGNOSIS — I10 Essential (primary) hypertension: Secondary | ICD-10-CM | POA: Diagnosis not present

## 2021-04-30 DIAGNOSIS — Z87891 Personal history of nicotine dependence: Secondary | ICD-10-CM | POA: Diagnosis not present

## 2021-04-30 DIAGNOSIS — Z96641 Presence of right artificial hip joint: Secondary | ICD-10-CM | POA: Diagnosis not present

## 2021-04-30 DIAGNOSIS — Z20822 Contact with and (suspected) exposure to covid-19: Secondary | ICD-10-CM | POA: Diagnosis not present

## 2021-04-30 DIAGNOSIS — E119 Type 2 diabetes mellitus without complications: Secondary | ICD-10-CM | POA: Diagnosis not present

## 2021-04-30 DIAGNOSIS — I4891 Unspecified atrial fibrillation: Secondary | ICD-10-CM | POA: Diagnosis not present

## 2021-04-30 LAB — CBC
HCT: 34.5 % — ABNORMAL LOW (ref 39.0–52.0)
Hemoglobin: 11.6 g/dL — ABNORMAL LOW (ref 13.0–17.0)
MCH: 31.9 pg (ref 26.0–34.0)
MCHC: 33.6 g/dL (ref 30.0–36.0)
MCV: 94.8 fL (ref 80.0–100.0)
Platelets: 231 10*3/uL (ref 150–400)
RBC: 3.64 MIL/uL — ABNORMAL LOW (ref 4.22–5.81)
RDW: 12.6 % (ref 11.5–15.5)
WBC: 10.9 10*3/uL — ABNORMAL HIGH (ref 4.0–10.5)
nRBC: 0 % (ref 0.0–0.2)

## 2021-04-30 LAB — BASIC METABOLIC PANEL
Anion gap: 4 — ABNORMAL LOW (ref 5–15)
BUN: 10 mg/dL (ref 8–23)
CO2: 27 mmol/L (ref 22–32)
Calcium: 9 mg/dL (ref 8.9–10.3)
Chloride: 110 mmol/L (ref 98–111)
Creatinine, Ser: 0.76 mg/dL (ref 0.61–1.24)
GFR, Estimated: >60 mL/min (ref >60)
Glucose, Bld: 184 mg/dL — ABNORMAL HIGH (ref 70–99)
Potassium: 4.1 mmol/L (ref 3.5–5.1)
Sodium: 141 mmol/L (ref 135–145)

## 2021-04-30 LAB — GLUCOSE, CAPILLARY
Glucose-Capillary: 148 mg/dL — ABNORMAL HIGH (ref 70–99)
Glucose-Capillary: 162 mg/dL — ABNORMAL HIGH (ref 70–99)

## 2021-04-30 MED ORDER — COLCHICINE 0.6 MG PO TABS: 0.6000 mg | ORAL_TABLET | Freq: Two times a day (BID) | ORAL | 0 refills | Status: DC

## 2021-04-30 MED ORDER — ACETAMINOPHEN 325 MG PO TABS: 325.0000 mg | ORAL_TABLET | Freq: Four times a day (QID) | ORAL | 0 refills | Status: DC | PRN

## 2021-04-30 MED ORDER — DOCUSATE SODIUM 100 MG PO CAPS: 100.0000 mg | ORAL_CAPSULE | Freq: Two times a day (BID) | ORAL | 0 refills | Status: DC

## 2021-04-30 MED ORDER — ONDANSETRON HCL 4 MG PO TABS
4.0000 mg | ORAL_TABLET | Freq: Four times a day (QID) | ORAL | 0 refills | Status: DC | PRN
Start: 2021-04-30 — End: 2021-05-09

## 2021-04-30 MED ORDER — SENNA 8.6 MG PO TABS: 1.0000 | ORAL_TABLET | Freq: Two times a day (BID) | ORAL | 0 refills | Status: DC

## 2021-04-30 MED ORDER — HYDROCODONE-ACETAMINOPHEN 5-325 MG PO TABS: 1.0000 | ORAL_TABLET | ORAL | 0 refills | Status: DC | PRN

## 2021-04-30 MED ORDER — CHLORHEXIDINE GLUCONATE CLOTH 2 % EX PADS: 6.0000 | MEDICATED_PAD | Freq: Every day | CUTANEOUS | Status: DC

## 2021-04-30 NOTE — Progress Notes (Signed)
The patient is alert and oriented and has been seen by his physician. The orders for discharge were written. IV has been removed. Went over discharge instructions with patient and family. He is being discharged via wheelchair with all of his belongings.  

## 2021-04-30 NOTE — Progress Notes (Signed)
Subjective: 1 Day Post-Op Procedure(s) (LRB): TOTAL HIP ARTHROPLASTY ANTERIOR APPROACH (Right) Patient reports pain as moderate.    Objective: Vital signs in last 24 hours: Temp:  [97.8 F (36.6 C)-99.1 F (37.3 C)] 98.2 F (36.8 C) (10/07 1022) Pulse Rate:  [60-101] 101 (10/07 1022) Resp:  [11-24] 17 (10/07 1022) BP: (83-128)/(53-77) 121/69 (10/07 1022) SpO2:  [93 %-100 %] 98 % (10/07 1022) Weight:  [68 kg] 68 kg (10/06 1952)  Intake/Output from previous day: 10/06 0701 - 10/07 0700 In: 5247.4 [P.O.:1320; I.V.:3327.4; IV Piggyback:600] Out: 3400 [Urine:3200; Blood:200] Intake/Output this shift: Total I/O In: 120 [P.O.:120] Out: -   Recent Labs    04/30/21 0344  HGB 11.6*   Recent Labs    04/30/21 0344  WBC 10.9*  RBC 3.64*  HCT 34.5*  PLT 231   Recent Labs    04/30/21 0344  NA 141  K 4.1  CL 110  CO2 27  BUN 10  CREATININE 0.76  GLUCOSE 184*  CALCIUM 9.0   Recent Labs    04/29/21 1030  INR 1.0    Neurovascular intact Sensation intact distally Intact pulses distally Dorsiflexion/Plantar flexion intact Incision: dressing C/D/I   Assessment/Plan: 1 Day Post-Op Procedure(s) (LRB): TOTAL HIP ARTHROPLASTY ANTERIOR APPROACH (Right) Advance diet Up with therapy D/C IV fluids Discharge home with home health when PT goals met    Patient's anticipated LOS is less than 2 midnights, meeting these requirements: - Younger than 65 - Lives within 1 hour of care - Has a competent adult at home to recover with post-op recover - NO history of  - Chronic pain requiring opiods  - Diabetes  - Coronary Artery Disease  - Heart failure  - Heart attack  - Stroke  - DVT/VTE  - Cardiac arrhythmia  - Respiratory Failure/COPD  - Renal failure  - Anemia  - Advanced Liver disease     BLAIR Terris Germano 04/30/2021, 11:43 AM [phone]

## 2021-04-30 NOTE — Evaluation (Addendum)
Physical Therapy Evaluation Patient Details Name: Robert Irwin MRN: [id number] DOB: 1940-09-28 Today's Date: 04/30/2021  History of Present Illness  Robert Irwin is a 80 yo male s/p R THA AA due to arthritis and subchondral fracture. PMH: afib, diabetes, HTN  Clinical Impression  Pt is s/p R THA AA resulting in the deficits listed below (see PT Problem List). Pt independent at baseline, no DME at home. Pt currently requiring min guard, step to gait pattern for 120 ft using RW, no LOB. Pt educated on HEP, provided written/illustrated exercises, able to perform ankle pumps and quad sets with good strength. Plan to progress gait training and stair training next session.  Pt will benefit from skilled PT to increase their independence and safety with mobility to allow discharge to the venue listed below.        Recommendations for follow up therapy are one component of a multi-disciplinary discharge planning process, led by the attending physician.  Recommendations may be updated based on patient status, additional functional criteria and insurance authorization.  Follow Up Recommendations Follow surgeon's recommendation for DC plan and follow-up therapies;Home health PT    Equipment Recommendations  Rolling walker with 5" wheels    Recommendations for Other Services       Precautions / Restrictions Precautions Precautions: Fall Restrictions Weight Bearing Restrictions: No RLE Weight Bearing: Weight bearing as tolerated      Mobility  Bed Mobility Overal bed mobility: Needs Assistance Bed Mobility: Sit to Supine  Supine to sit: Min guard Sit to supine: Min guardGeneral bed mobility comments: increased time, able to self assist RLE Back into bed    Transfers Overall transfer level: Needs assistance Equipment used: Rolling walker (2 wheeled) Transfers: Sit to/from Stand Sit to Stand: Supervision  General transfer comment: BUE assisting to power to stand, RLE slightly  extended  Ambulation/Gait Ambulation/Gait assistance: Supervision Gait Distance (Feet): 130 Feet Assistive device: Rolling walker (2 wheeled) Gait Pattern/deviations: Step-to pattern;Decreased stride length;Decreased stance time - right Gait velocity: decreased   General Gait Details: step to pattern with decreased RLE stance time, progressed to slight step through pattern, no LOB or unsteadiness noted  Stairs Stairs: Yes Stairs assistance: Min guard Stair Management: Backwards;With walker Number of Stairs: 9 General stair comments: ascending steps backwards with LLE and descending forward with RLE, therapist providing min guard initially then daughter providing with 2nd and 3rd performance, good sequencing and good steadiness without LOB  Wheelchair Mobility    Modified Rankin (Stroke Patients Only)       Balance Overall balance assessment: Needs assistance Sitting-balance support: Feet supported Sitting balance-Leahy Scale: Good Sitting balance - Comments: seated EOB   Standing balance support: During functional activity;Bilateral upper extremity supported Standing balance-Leahy Scale: Fair Standing balance comment: static without UE support, dynamic with UE support        Pertinent Vitals/Pain Pain Assessment: 0-10 Pain Score: 4  Faces Pain Scale: Hurts little more Pain Location: R hip Pain Descriptors / Indicators: Aching;Sore;Tightness Pain Intervention(s): Limited activity within patient's tolerance;Monitored during session;Repositioned;Ice applied    Home Living                        Prior Function          Hand Dominance        Extremity/Trunk Assessment   Upper Extremity Assessment Upper Extremity Assessment: Overall WFL for tasks assessed    Lower Extremity Assessment Lower Extremity Assessment: RLE deficits/detail;LLE deficits/detail RLE Deficits /  Details: ankle AROM WNL, knee with good quad set, SLR attempted but unable to clear  LE from bed, LAQ ~75% ROM, reports numb/tingling in R thigh RLE Coordination: WNL LLE Deficits / Details: AROM WNL, strength 4+/5, denies numbness/tingling LLE Sensation: WNL LLE Coordination: WNL    Cervical / Trunk Assessment Cervical / Trunk Assessment: Normal  Communication      Cognition Arousal/Alertness: Awake/alert Behavior During Therapy: WFL for tasks assessed/performed Overall Cognitive Status: Within Functional Limits for tasks assessed     General Comments      Exercises Total Joint Exercises Ankle Circles/Pumps: Seated;AROM;Strengthening;Both;10 reps Quad Sets: Seated;AROM;Strengthening;Right;10 reps   Assessment/Plan    PT Assessment Patient needs continued PT services  PT Problem List Decreased strength;Decreased range of motion;Decreased activity tolerance;Decreased balance;Decreased mobility;Decreased knowledge of use of DME;Impaired sensation;Pain       PT Treatment Interventions DME instruction;Gait training;Stair training;Functional mobility training;Therapeutic activities;Therapeutic exercise;Balance training;Patient/family education    PT Goals (Current goals can be found in the Care Plan section)  Acute Rehab PT Goals Patient Stated Goal: return home PT Goal Formulation: With patient/family Time For Goal Achievement: 05/14/21 Potential to Achieve Goals: Good    Frequency 7X/week   Barriers to discharge        Co-evaluation               AM-PAC PT "6 Clicks" Mobility  Outcome Measure Help needed turning from your back to your side while in a flat bed without using bedrails?: A Little Help needed moving from lying on your back to sitting on the side of a flat bed without using bedrails?: A Little Help needed moving to and from a bed to a chair (including a wheelchair)?: A Little Help needed standing up from a chair using your arms (e.g., wheelchair or bedside chair)?: A Little Help needed to walk in hospital room?: A Little Help needed  climbing 3-5 steps with a railing? : A Little 6 Click Score: 18    End of Session Equipment Utilized During Treatment: Gait belt Activity Tolerance: Patient tolerated treatment well Patient left: in bed;with call bell/phone within reach;with bed alarm set;with family/visitor present Nurse Communication: Mobility status PT Visit Diagnosis: Other abnormalities of gait and mobility (R26.89);Difficulty in walking, not elsewhere classified (R26.2);Pain Pain - Right/Left: Right Pain - part of body: Hip    Time: 1345-1430 PT Time Calculation (min) (ACUTE ONLY): 45 min   Charges:   PT Evaluation $PT Eval Low Complexity: 1 Low PT Treatments $Gait Training: 23-37 mins $Therapeutic Exercise: 8-22 mins $Therapeutic Activity: 8-22 mins         Talbot Grumbling PT, DPT 04/30/21, 12:37 PM

## 2021-04-30 NOTE — Plan of Care (Signed)

## 2021-04-30 NOTE — Progress Notes (Signed)
Physical Therapy Treatment Patient Details Name: Robert Irwin MRN: [id number] DOB: 16-Jan-1941 Today's Date: 04/30/2021   History of Present Illness Robert Irwin is a 80 yo male s/p R THA AA due to arthritis and subchondral fracture. PMH: afib, diabetes, HTN    PT Comments    Pt ambulates increased distance with RW, progress to slight step through pattern. Pt completed stair training with PT, then daughter able to assist with 2nd and 3rd performance, no LOB. Pt returned to supine once back in room, educated on self assisting RLE back into bed due to weakness with good carryover. Completed education regarding mobilizing around the home, icing, and HEP with recovery. All questions answered, pt with good family support and safe to d/c home with family and HHPT. RN notified of pt meeting all goals.   Recommendations for follow up therapy are one component of a multi-disciplinary discharge planning process, led by the attending physician.  Recommendations may be updated based on patient status, additional functional criteria and insurance authorization.  Follow Up Recommendations  Follow surgeon's recommendation for DC plan and follow-up therapies;Home health PT     Equipment Recommendations  Rolling walker with 5" wheels    Recommendations for Other Services       Precautions / Restrictions Precautions Precautions: Fall Restrictions Weight Bearing Restrictions: No RLE Weight Bearing: Weight bearing as tolerated     Mobility  Bed Mobility Overal bed mobility: Needs Assistance Bed Mobility: Sit to Supine  Supine to sit: Min guard Sit to supine: Min guard   General bed mobility comments: increased time, able to self assist RLE Back into bed    Transfers Overall transfer level: Needs assistance Equipment used: Rolling walker (2 wheeled) Transfers: Sit to/from Stand Sit to Stand: Supervision  General transfer comment: BUE assisting to power to stand, RLE slightly  extended  Ambulation/Gait Ambulation/Gait assistance: Supervision Gait Distance (Feet): 130 Feet Assistive device: Rolling walker (2 wheeled) Gait Pattern/deviations: Step-to pattern;Decreased stride length;Decreased stance time - right Gait velocity: decreased   General Gait Details: step to pattern with decreased RLE stance time, progressed to slight step through pattern, no LOB or unsteadiness noted   Stairs Stairs: Yes Stairs assistance: Min guard Stair Management: Backwards;With walker Number of Stairs: 9 General stair comments: ascending steps backwards with LLE and descending forward with RLE, therapist providing min guard initially then daughter providing with 2nd and 3rd performance, good sequencing and good steadiness without LOB   Wheelchair Mobility    Modified Rankin (Stroke Patients Only)       Balance Overall balance assessment: Needs assistance Sitting-balance support: Feet supported Sitting balance-Leahy Scale: Good Sitting balance - Comments: seated EOB   Standing balance support: During functional activity;Bilateral upper extremity supported Standing balance-Leahy Scale: Fair Standing balance comment: static without UE support, dynamic with UE support       Cognition Arousal/Alertness: Awake/alert Behavior During Therapy: WFL for tasks assessed/performed Overall Cognitive Status: Within Functional Limits for tasks assessed         Exercises     General Comments        Pertinent Vitals/Pain Pain Assessment: 0-10 Pain Score: 4  Faces Pain Scale: Hurts little more Pain Location: R hip Pain Descriptors / Indicators: Aching;Sore;Tightness Pain Intervention(s): Limited activity within patient's tolerance;Monitored during session;Repositioned;Ice applied    Home Living                      Prior Function  PT Goals (current goals can now be found in the care plan section) Acute Rehab PT Goals Patient Stated Goal: return  home PT Goal Formulation: With patient/family Time For Goal Achievement: 05/14/21 Potential to Achieve Goals: Good Progress towards PT goals: Progressing toward goals    Frequency    7X/week      PT Plan Current plan remains appropriate    Co-evaluation              AM-PAC PT "6 Clicks" Mobility   Outcome Measure  Help needed turning from your back to your side while in a flat bed without using bedrails?: A Little Help needed moving from lying on your back to sitting on the side of a flat bed without using bedrails?: A Little Help needed moving to and from a bed to a chair (including a wheelchair)?: A Little Help needed standing up from a chair using your arms (e.g., wheelchair or bedside chair)?: A Little Help needed to walk in hospital room?: A Little Help needed climbing 3-5 steps with a railing? : A Little 6 Click Score: 18    End of Session Equipment Utilized During Treatment: Gait belt Activity Tolerance: Patient tolerated treatment well Patient left: in bed;with call bell/phone within reach;with bed alarm set;with family/visitor present Nurse Communication: Mobility status PT Visit Diagnosis: Other abnormalities of gait and mobility (R26.89);Difficulty in walking, not elsewhere classified (R26.2);Pain Pain - Right/Left: Right Pain - part of body: Hip     Time: 1345-1430 PT Time Calculation (min) (ACUTE ONLY): 45 min  Charges:  $Gait Training: 23-37 mins $Therapeutic Activity: 8-22 mins                      Tori Lexxi Koslow PT, DPT 04/30/21, 2:40 PM

## 2021-04-30 NOTE — Discharge Summary (Signed)
Patient ID: Robert Irwin MRN: [id number] DOB/AGE: 79-Jan-1942 79 y.o.  Admit date: 04/29/2021 Discharge date: 04/30/2021  Admission Diagnoses:  Active Problems:   Osteoarthritis of right hip   Discharge Diagnoses:  Same  Past Medical History:  Diagnosis Date   A-fib ([place])    Arthritis    Atrial fibrillation ([place])    Diabetes mellitus    Dysrhythmia    ED (erectile dysfunction)    Epistaxis    Hyperlipidemia    Hypertension     Surgeries: Procedure(s): TOTAL HIP ARTHROPLASTY ANTERIOR APPROACH on 04/29/2021   Consultants:   Discharged Condition: Improved  Hospital Course: Robert Irwin is an 81 y.o. male who was admitted 04/29/2021 for operative treatment of<principal problem not specified>. Patient has severe unremitting pain that affects sleep, daily activities, and work/hobbies. After pre-op clearance the patient was taken to the operating room on 04/29/2021 and underwent  Procedure(s): TOTAL HIP ARTHROPLASTY ANTERIOR APPROACH.    Patient was given perioperative antibiotics:  Anti-infectives (From admission, onward)    Start     Dose/Rate Route Frequency Ordered Stop   04/29/21 1830  ceFAZolin (ANCEF) IVPB 2g/100 mL premix        2 g 200 mL/hr over 30 Minutes Intravenous Every 6 hours 04/29/21 1725 04/30/21 0032   04/29/21 1015  ceFAZolin (ANCEF) IVPB 2g/100 mL premix        2 g 200 mL/hr over 30 Minutes Intravenous On call to O.R. 04/29/21 1000 04/29/21 1302        Patient was given sequential compression devices, early ambulation, and chemoprophylaxis to prevent DVT.  Patient benefited maximally from hospital stay and there were no complications.    Recent vital signs: Patient Vitals for the past 24 hrs:  BP Temp Temp src Pulse Resp SpO2 Height Weight  04/30/21 1022 121/69 98.2 F (36.8 C) -- (!) 101 17 98 % -- --  04/30/21 0628 122/71 98.2 F (36.8 C) -- 90 17 96 % -- --  04/30/21 0136 118/68 98 F (36.7 C) -- 71 17 98 % -- --  04/29/21 2152 117/62 97.8 F  (36.6 C) -- 84 17 98 % -- --  04/29/21 1952 128/77 98.3 F (36.8 C) Oral 84 16 -- 5\' 7"  (1.702 m) 68 kg  04/29/21 1725 128/77 98.3 F (36.8 C) Oral 84 16 100 % -- --  04/29/21 1700 122/60 98.9 F (37.2 C) -- 74 18 100 % -- --  04/29/21 1645 107/62 -- -- 69 15 100 % -- --  04/29/21 1630 111/61 -- -- 69 13 100 % -- --  04/29/21 1615 108/68 -- -- 70 11 100 % -- --  04/29/21 1600 (!) 100/59 -- -- 68 16 100 % -- --  04/29/21 1545 (!) 105/59 -- -- 70 19 100 % -- --  04/29/21 1530 (!) 89/56 -- -- 60 17 100 % -- --  04/29/21 1515 (!) 83/53 -- -- 67 (!) 22 100 % -- --  04/29/21 1502 (!) 94/53 99.1 F (37.3 C) -- 65 (!) 24 93 % -- --     Recent laboratory studies:  Recent Labs    04/29/21 1030 04/30/21 0344  WBC  --  10.9*  HGB  --  11.6*  HCT  --  34.5*  PLT  --  231  NA  --  141  K  --  4.1  CL  --  110  CO2  --  27  BUN  --  10  CREATININE  --  0.76  GLUCOSE  --  184*  INR 1.0  --   CALCIUM  --  9.0     Discharge Medications:   Allergies as of 04/30/2021   No Known Allergies      Medication List     STOP taking these medications    fish oil-omega-3 fatty acids 1000 MG capsule       TAKE these medications    acetaminophen 325 MG tablet Commonly known as: TYLENOL Take 1-2 tablets (325-650 mg total) by mouth every 6 (six) hours as needed for mild pain (pain score 1-3 or temp > 100.5).   atorvastatin 20 MG tablet Commonly known as: LIPITOR TAKE 1 TABLET EVERY DAY (KEEP UPCOMING MD APPOINTMENT FOR REFILLS)   calcium citrate-vitamin D 200-200 MG-UNIT Tabs Take 1 tablet by mouth daily.   colchicine 0.6 MG tablet Take 1 tablet (0.6 mg total) by mouth 2 (two) times daily for 6 days.   docusate sodium 100 MG capsule Commonly known as: COLACE Take 1 capsule (100 mg total) by mouth 2 (two) times daily.   glimepiride 2 MG tablet Commonly known as: AMARYL Take 1 mg by mouth daily before breakfast.   HYDROcodone-acetaminophen 5-325 MG tablet Commonly known  as: NORCO/VICODIN Take 1-2 tablets by mouth every 4 (four) hours as needed for moderate pain (pain score 4-6).   multivitamin with minerals Tabs tablet Take 1 tablet by mouth daily.   ondansetron 4 MG tablet Commonly known as: ZOFRAN Take 1 tablet (4 mg total) by mouth every 6 (six) hours as needed for nausea.   OSTEO BI-FLEX REGULAR STRENGTH PO Take 1 tablet by mouth 2 (two) times daily.   rivaroxaban 20 MG Tabs tablet Commonly known as: Xarelto TAKE 1 TABLET EVERY DAY WITH SUPPER.  Pt Overdue for follow-up, MUST see MD for FUTURE refills.   senna 8.6 MG Tabs tablet Commonly known as: SENOKOT Take 1 tablet (8.6 mg total) by mouth 2 (two) times daily.   Tiadylt ER 240 MG 24 hr capsule Generic drug: diltiazem TAKE 1 CAPSULE EVERY DAY (PLEASE KEEP [place] APPOINTMENT IN JULY 2022 FOR FUTURE REFILLS. THANK YOU)        Diagnostic Studies: DG Pelvis Portable  Result Date: 04/29/2021 CLINICAL DATA:  Postop EXAM: PORTABLE PELVIS 1-2 VIEWS COMPARISON:  None. FINDINGS: Right hip replacement with intact hardware and normal alignment. Gas in the soft tissues consistent with recent surgery IMPRESSION: Right hip replacement with expected postsurgical change Electronically Signed   By: Donavan Foil M.D.   On: 04/29/2021 16:12   DG Foot Complete Right  Result Date: 04/29/2021 CLINICAL DATA:  Right foot swelling without known injury. EXAM: RIGHT FOOT COMPLETE - 3+ VIEW COMPARISON:  None. FINDINGS: There is no evidence of fracture or dislocation. There is no evidence of arthropathy or other focal bone abnormality. Soft tissues are unremarkable. IMPRESSION: Negative. Electronically Signed   By: Marijo Conception M.D.   On: 04/29/2021 11:16   DG C-Arm 1-60 Min-No Report  Result Date: 04/29/2021 Fluoroscopy was utilized by the requesting physician.  No radiographic interpretation.   DG HIP OPERATIVE UNILAT W OR W/O PELVIS RIGHT  Result Date: 04/29/2021 CLINICAL DATA:  Hip surgery EXAM:  OPERATIVE right HIP (WITH PELVIS IF PERFORMED) 4 VIEWS TECHNIQUE: Fluoroscopic spot image(s) were submitted for interpretation post-operatively. COMPARISON:  None. FINDINGS: Four low resolution intraoperative spot views of the right hip were obtained. Total fluoroscopy time was 17 seconds. The images demonstrate a right hip replacement with normal alignment IMPRESSION:  Intraoperative fluoroscopic assistance provided during right hip replacement surgery Electronically Signed   By: Donavan Foil M.D.   On: 04/29/2021 16:13    Disposition:      Follow-up Information     Swinteck, Aaron Edelman, MD. Schedule an appointment as soon as possible for a visit in 2 week(s).   Specialty: Orthopedic Surgery Why: For suture removal, For wound re-check Contact information: [address] [place]  [address] [phone]                  Signed: Nehemiah Massed 04/30/2021, 11:50 AM   [phone]

## 2021-05-04 ENCOUNTER — Encounter (HOSPITAL_COMMUNITY): Payer: Self-pay | Admitting: Orthopedic Surgery

## 2021-05-07 ENCOUNTER — Encounter (HOSPITAL_BASED_OUTPATIENT_CLINIC_OR_DEPARTMENT_OTHER): Payer: Self-pay

## 2021-05-07 ENCOUNTER — Other Ambulatory Visit: Payer: Self-pay

## 2021-05-07 ENCOUNTER — Observation Stay (HOSPITAL_BASED_OUTPATIENT_CLINIC_OR_DEPARTMENT_OTHER)
Admission: EM | Admit: 2021-05-07 | Discharge: 2021-05-09 | Disposition: A | Discharge status: Home / Self Care | Payer: Medicare HMO | Attending: Internal Medicine | Admitting: Internal Medicine

## 2021-05-07 ENCOUNTER — Emergency Department (HOSPITAL_BASED_OUTPATIENT_CLINIC_OR_DEPARTMENT_OTHER): Payer: Medicare HMO

## 2021-05-07 DIAGNOSIS — K5900 Constipation, unspecified: Secondary | ICD-10-CM | POA: Diagnosis present

## 2021-05-07 DIAGNOSIS — R531 Weakness: Secondary | ICD-10-CM

## 2021-05-07 DIAGNOSIS — E119 Type 2 diabetes mellitus without complications: Secondary | ICD-10-CM

## 2021-05-07 DIAGNOSIS — R1110 Vomiting, unspecified: Secondary | ICD-10-CM | POA: Diagnosis present

## 2021-05-07 DIAGNOSIS — I4891 Unspecified atrial fibrillation: Secondary | ICD-10-CM | POA: Diagnosis not present

## 2021-05-07 DIAGNOSIS — Z20822 Contact with and (suspected) exposure to covid-19: Secondary | ICD-10-CM | POA: Diagnosis not present

## 2021-05-07 DIAGNOSIS — Z96641 Presence of right artificial hip joint: Secondary | ICD-10-CM | POA: Diagnosis not present

## 2021-05-07 DIAGNOSIS — R140 Abdominal distension (gaseous): Secondary | ICD-10-CM | POA: Diagnosis not present

## 2021-05-07 DIAGNOSIS — E860 Dehydration: Secondary | ICD-10-CM

## 2021-05-07 DIAGNOSIS — M1611 Unilateral primary osteoarthritis, right hip: Secondary | ICD-10-CM | POA: Diagnosis present

## 2021-05-07 DIAGNOSIS — R112 Nausea with vomiting, unspecified: Principal | ICD-10-CM | POA: Diagnosis present

## 2021-05-07 DIAGNOSIS — Z79899 Other long term (current) drug therapy: Secondary | ICD-10-CM | POA: Diagnosis not present

## 2021-05-07 DIAGNOSIS — E785 Hyperlipidemia, unspecified: Secondary | ICD-10-CM | POA: Diagnosis present

## 2021-05-07 DIAGNOSIS — Z87891 Personal history of nicotine dependence: Secondary | ICD-10-CM | POA: Diagnosis not present

## 2021-05-07 LAB — CBC WITH DIFFERENTIAL/PLATELET
Abs Immature Granulocytes: 0.16 10*3/uL — ABNORMAL HIGH (ref 0.00–0.07)
Basophils Absolute: 0 10*3/uL (ref 0.0–0.1)
Basophils Relative: 0 %
Eosinophils Absolute: 0.1 10*3/uL (ref 0.0–0.5)
Eosinophils Relative: 1 %
HCT: 35.5 % — ABNORMAL LOW (ref 39.0–52.0)
Hemoglobin: 12 g/dL — ABNORMAL LOW (ref 13.0–17.0)
Immature Granulocytes: 2 %
Lymphocytes Relative: 14 %
Lymphs Abs: 1.3 10*3/uL (ref 0.7–4.0)
MCH: 31.7 pg (ref 26.0–34.0)
MCHC: 33.8 g/dL (ref 30.0–36.0)
MCV: 93.7 fL (ref 80.0–100.0)
Monocytes Absolute: 0.7 10*3/uL (ref 0.1–1.0)
Monocytes Relative: 7 %
Neutro Abs: 6.9 10*3/uL (ref 1.7–7.7)
Neutrophils Relative %: 76 %
Platelets: 314 10*3/uL (ref 150–400)
RBC: 3.79 MIL/uL — ABNORMAL LOW (ref 4.22–5.81)
RDW: 12.7 % (ref 11.5–15.5)
WBC: 9 10*3/uL (ref 4.0–10.5)
nRBC: 0 % (ref 0.0–0.2)

## 2021-05-07 LAB — COMPREHENSIVE METABOLIC PANEL
ALT: 31 U/L (ref 0–44)
AST: 29 U/L (ref 15–41)
Albumin: 3.8 g/dL (ref 3.5–5.0)
Alkaline Phosphatase: 74 U/L (ref 38–126)
Anion gap: 11 (ref 5–15)
BUN: 12 mg/dL (ref 8–23)
CO2: 27 mmol/L (ref 22–32)
Calcium: 9.1 mg/dL (ref 8.9–10.3)
Chloride: 98 mmol/L (ref 98–111)
Creatinine, Ser: 0.8 mg/dL (ref 0.61–1.24)
GFR, Estimated: >60 mL/min (ref >60)
Glucose, Bld: 131 mg/dL — ABNORMAL HIGH (ref 70–99)
Potassium: 3.8 mmol/L (ref 3.5–5.1)
Sodium: 136 mmol/L (ref 135–145)
Total Bilirubin: 1.3 mg/dL — ABNORMAL HIGH (ref 0.3–1.2)
Total Protein: 6.6 g/dL (ref 6.5–8.1)

## 2021-05-07 LAB — GLUCOSE, CAPILLARY: Glucose-Capillary: 87 mg/dL (ref 70–99)

## 2021-05-07 LAB — URINALYSIS, ROUTINE W REFLEX MICROSCOPIC
Bilirubin Urine: NEGATIVE
Glucose, UA: NEGATIVE mg/dL
Hgb urine dipstick: NEGATIVE
Ketones, ur: 15 mg/dL — AB
Leukocytes,Ua: NEGATIVE
Nitrite: NEGATIVE
Protein, ur: NEGATIVE mg/dL
Specific Gravity, Urine: 1.018 (ref 1.005–1.030)
pH: 7.5 (ref 5.0–8.0)

## 2021-05-07 LAB — LIPASE, BLOOD: Lipase: 26 U/L (ref 11–51)

## 2021-05-07 LAB — RESP PANEL BY RT-PCR (FLU A&B, COVID) ARPGX2
Influenza A by PCR: NEGATIVE
Influenza B by PCR: NEGATIVE
SARS Coronavirus 2 by RT PCR: NEGATIVE

## 2021-05-07 MED ORDER — METOCLOPRAMIDE HCL 5 MG/ML IJ SOLN
5.0000 mg | Freq: Once | INTRAMUSCULAR | Status: AC
  Administered 2021-05-07: 5 mg via INTRAVENOUS
  Filled 2021-05-07: qty 2

## 2021-05-07 MED ORDER — RIVAROXABAN 20 MG PO TABS
20.0000 mg | ORAL_TABLET | Freq: Every day | ORAL | Status: DC
  Administered 2021-05-07 – 2021-05-08 (×2): 20 mg via ORAL
  Filled 2021-05-07 (×2): qty 1

## 2021-05-07 MED ORDER — PANTOPRAZOLE SODIUM 40 MG IV SOLR
40.0000 mg | INTRAVENOUS | Status: DC
  Administered 2021-05-07 – 2021-05-08 (×2): 40 mg via INTRAVENOUS
  Filled 2021-05-07 (×2): qty 40

## 2021-05-07 MED ORDER — MORPHINE SULFATE (PF) 4 MG/ML IV SOLN
6.0000 mg | Freq: Once | INTRAVENOUS | Status: AC
  Administered 2021-05-07: 6 mg via INTRAVENOUS
  Filled 2021-05-07: qty 2

## 2021-05-07 MED ORDER — INSULIN ASPART 100 UNIT/ML IJ SOLN
0.0000 [IU] | Freq: Four times a day (QID) | INTRAMUSCULAR | Status: DC
  Administered 2021-05-08: 2 [IU] via SUBCUTANEOUS

## 2021-05-07 MED ORDER — METOPROLOL TARTRATE 5 MG/5ML IV SOLN
2.5000 mg | Freq: Three times a day (TID) | INTRAVENOUS | Status: DC
  Administered 2021-05-07 – 2021-05-08 (×2): 2.5 mg via INTRAVENOUS
  Filled 2021-05-07 (×2): qty 5

## 2021-05-07 MED ORDER — ACETAMINOPHEN 650 MG RE SUPP: 650.0000 mg | Freq: Four times a day (QID) | RECTAL | Status: DC | PRN

## 2021-05-07 MED ORDER — MORPHINE SULFATE (PF) 2 MG/ML IV SOLN
0.5000 mg | INTRAVENOUS | Status: DC | PRN
  Administered 2021-05-07: 0.5 mg via INTRAVENOUS
  Filled 2021-05-07: qty 1

## 2021-05-07 MED ORDER — IOHEXOL 300 MG/ML  SOLN
100.0000 mL | Freq: Once | INTRAMUSCULAR | Status: AC | PRN
  Administered 2021-05-07: 100 mL via INTRAVENOUS

## 2021-05-07 MED ORDER — KETOROLAC TROMETHAMINE 15 MG/ML IJ SOLN: 15.0000 mg | Freq: Four times a day (QID) | INTRAMUSCULAR | Status: DC | PRN

## 2021-05-07 MED ORDER — OXYCODONE HCL 5 MG PO TABS
5.0000 mg | ORAL_TABLET | ORAL | Status: DC | PRN
  Administered 2021-05-07: 5 mg via ORAL
  Filled 2021-05-07: qty 1

## 2021-05-07 MED ORDER — BISACODYL 10 MG RE SUPP
10.0000 mg | Freq: Every day | RECTAL | Status: DC
  Filled 2021-05-07: qty 1

## 2021-05-07 MED ORDER — METHOCARBAMOL 1000 MG/10ML IJ SOLN
500.0000 mg | Freq: Four times a day (QID) | INTRAVENOUS | Status: DC | PRN
  Filled 2021-05-07: qty 5

## 2021-05-07 MED ORDER — LACTATED RINGERS IV SOLN: INTRAVENOUS | Status: DC

## 2021-05-07 MED ORDER — ACETAMINOPHEN 325 MG PO TABS
650.0000 mg | ORAL_TABLET | Freq: Four times a day (QID) | ORAL | Status: DC | PRN
  Administered 2021-05-07 – 2021-05-09 (×4): 650 mg via ORAL
  Filled 2021-05-07 (×4): qty 2

## 2021-05-07 MED ORDER — SODIUM CHLORIDE 0.9% FLUSH
3.0000 mL | Freq: Two times a day (BID) | INTRAVENOUS | Status: DC
  Administered 2021-05-07 – 2021-05-09 (×4): 3 mL via INTRAVENOUS

## 2021-05-07 MED ORDER — HYDRALAZINE HCL 20 MG/ML IJ SOLN: 5.0000 mg | Freq: Four times a day (QID) | INTRAMUSCULAR | Status: DC | PRN

## 2021-05-07 MED ORDER — LACTATED RINGERS IV BOLUS
500.0000 mL | Freq: Once | INTRAVENOUS | Status: AC
  Administered 2021-05-07: 500 mL via INTRAVENOUS

## 2021-05-07 MED ORDER — HYDROCODONE-ACETAMINOPHEN 5-325 MG PO TABS: 1.0000 | ORAL_TABLET | ORAL | Status: DC | PRN

## 2021-05-07 MED ORDER — PROCHLORPERAZINE EDISYLATE 10 MG/2ML IJ SOLN: 10.0000 mg | Freq: Four times a day (QID) | INTRAMUSCULAR | Status: DC | PRN

## 2021-05-07 MED ORDER — SORBITOL 70 % SOLN
960.0000 mL | TOPICAL_OIL | Freq: Once | ORAL | Status: AC
  Administered 2021-05-07: 960 mL via RECTAL
  Filled 2021-05-07: qty 473

## 2021-05-07 MED ORDER — LACTATED RINGERS IV BOLUS
1000.0000 mL | Freq: Once | INTRAVENOUS | Status: AC
  Administered 2021-05-07: 1000 mL via INTRAVENOUS

## 2021-05-07 MED ORDER — ONDANSETRON HCL 4 MG/2ML IJ SOLN
4.0000 mg | Freq: Three times a day (TID) | INTRAMUSCULAR | Status: DC
  Administered 2021-05-07 – 2021-05-08 (×2): 4 mg via INTRAVENOUS
  Filled 2021-05-07 (×2): qty 2

## 2021-05-07 MED ORDER — ONDANSETRON HCL 4 MG/2ML IJ SOLN
4.0000 mg | Freq: Once | INTRAMUSCULAR | Status: AC
  Administered 2021-05-07: 4 mg via INTRAVENOUS
  Filled 2021-05-07: qty 2

## 2021-05-07 MED ORDER — SENNA 8.6 MG PO TABS
1.0000 | ORAL_TABLET | Freq: Two times a day (BID) | ORAL | Status: DC
  Administered 2021-05-07: 8.6 mg via ORAL
  Filled 2021-05-07 (×2): qty 1

## 2021-05-07 NOTE — ED Provider Notes (Signed)
[facility] EMERGENCY DEPT Provider Note   CSN: [id number] Arrival date & time: 05/07/21  0845     History Chief Complaint  Patient presents with   Emesis    Robert Irwin is a 80 y.o. male.  80 year old male presents with 1 week of decreased oral intake along with nonbilious emesis.  Does have a history of an appendectomy.  Endorses increased weakness as well.  Had surgery for a right hip replacement several days ago.  Since that time he has been taking analgesics and has had some constipation.  No reported fevers.  No blood per rectum.  No urinary symptoms.  Pain characterizes cramping and diffuse.  Nothing makes it better or worse no treatment use prior to arrival      Past Medical History:  Diagnosis Date   A-fib [facility])    Arthritis    Atrial fibrillation ([place])    Diabetes mellitus    Dysrhythmia    ED (erectile dysfunction)    Epistaxis    Hyperlipidemia    Hypertension     Patient Active Problem List   Diagnosis Date Noted   Osteoarthritis of right hip 04/29/2021   Encounter for therapeutic drug monitoring 08/22/2013   Atrial fibrillation ([place]) 05/10/2013   Anemia 12/16/2011   Epistaxis 12/14/2011   Hyperlipidemia 12/14/2011   Diabetes mellitus ([place]) 12/14/2011   Diabetes mellitus with coincident hypertension ([place]) 12/14/2011   Atrial fibrillation with RVR ([place]) 12/14/2011    Past Surgical History:  Procedure Laterality Date   APPENDECTOMY     NASAL MASS EXCISION     NASAL SINUS SURGERY  12/16/2011   Procedure: ENDOSCOPIC SINUS SURGERY;  Surgeon: Rozetta Nunnery, MD;  Location: [facility];  Service: ENT;  Laterality: N/A;  for control of epistaxis   TOTAL HIP ARTHROPLASTY Right 04/29/2021   Procedure: TOTAL HIP ARTHROPLASTY ANTERIOR APPROACH;  Surgeon: Rod Can, MD;  Location: [facility];  Service: Orthopedics;  Laterality: Right;       No family history on file.  Social History   Tobacco Use   Smoking status: Former    Types:  Cigarettes    Quit date: 07/25/1990    Years since quitting: 30.8   Smokeless tobacco: Never  Vaping Use   Vaping Use: Never used  Substance Use Topics   Alcohol use: Not Currently   Drug use: No    Home Medications Prior to Admission medications   Medication Sig Start Date End Date Taking? Authorizing Provider  acetaminophen (TYLENOL) 325 MG tablet Take 1-2 tablets (325-650 mg total) by mouth every 6 (six) hours as needed for mild pain (pain score 1-3 or temp > 100.5). 04/30/21  Yes  Norris B, PA-C  atorvastatin (LIPITOR) 20 MG tablet TAKE 1 TABLET EVERY DAY (KEEP UPCOMING MD APPOINTMENT FOR REFILLS) 12/17/20  Yes Jerline Pain, MD  calcium citrate-vitamin D 200-200 MG-UNIT TABS Take 1 tablet by mouth daily.   Yes [provider]  docusate sodium (COLACE) 100 MG capsule Take 1 capsule (100 mg total) by mouth 2 (two) times daily. 04/30/21  Yes Benedetto Goad, PA-C  glimepiride (AMARYL) 2 MG tablet Take 1 mg by mouth daily before breakfast.   Yes [provider]  Glucosamine-Chondroitin (OSTEO BI-FLEX REGULAR STRENGTH PO) Take 1 tablet by mouth 2 (two) times daily.   Yes [provider]  HYDROcodone-acetaminophen (NORCO/VICODIN) 5-325 MG tablet Take 1-2 tablets by mouth every 4 (four) hours as needed for moderate pain (pain score 4-6). 04/30/21  Yes  Benedetto Goad, PA-C  Multiple Vitamin (MULITIVITAMIN WITH MINERALS) TABS Take 1 tablet by mouth daily.   Yes [provider]  ondansetron (ZOFRAN) 4 MG tablet Take 1 tablet (4 mg total) by mouth every 6 (six) hours as needed for nausea. 04/30/21  Yes Benedetto Goad, PA-C  rivaroxaban (XARELTO) 20 MG TABS tablet TAKE 1 TABLET EVERY DAY WITH SUPPER.  Pt Overdue for follow-up, MUST see MD for FUTURE refills. 01/21/21  Yes Jerline Pain, MD  senna (SENOKOT) 8.6 MG TABS tablet Take 1 tablet (8.6 mg total) by mouth 2 (two) times daily. 04/30/21  Yes Benedetto Goad, PA-C  TIADYLT ER 240 MG 24 hr capsule TAKE 1  CAPSULE EVERY DAY (PLEASE KEEP [place] APPOINTMENT IN JULY 2022 FOR FUTURE REFILLS. THANK YOU) 04/27/21  Yes Jerline Pain, MD  colchicine 0.6 MG tablet Take 1 tablet (0.6 mg total) by mouth 2 (two) times daily for 6 days. 04/30/21 05/06/21  Benedetto Goad, PA-C    Allergies    Patient has no known allergies.  Review of Systems   Review of Systems  All other systems reviewed and are negative.  Physical Exam Updated Vital Signs BP (!) 172/100 (BP Location: Right Arm)   Pulse (!) 111   Temp 97.9 F (36.6 C) (Oral)   Resp 16   Ht 1.702 m (5\' 7" )   Wt 64.9 kg   SpO2 100%   BMI 22.40 kg/m   Physical Exam Vitals and nursing note reviewed.  Constitutional:      General: He is not in acute distress.    Appearance: Normal appearance. He is well-developed. He is not toxic-appearing.  HENT:     Head: Normocephalic and atraumatic.  Eyes:     General: Lids are normal.     Conjunctiva/sclera: Conjunctivae normal.     Pupils: Pupils are equal, round, and reactive to light.  Neck:     Thyroid: No thyroid mass.     Trachea: No tracheal deviation.  Cardiovascular:     Rate and Rhythm: Normal rate and regular rhythm.     Heart sounds: Normal heart sounds. No murmur heard.   No gallop.  Pulmonary:     Effort: Pulmonary effort is normal. No respiratory distress.     Breath sounds: Normal breath sounds. No stridor. No decreased breath sounds, wheezing, rhonchi or rales.  Abdominal:     General: There is no distension.     Palpations: Abdomen is soft.     Tenderness: There is no abdominal tenderness. There is guarding. There is no rebound.    Musculoskeletal:        General: No tenderness. Normal range of motion.     Cervical back: Normal range of motion and neck supple.       Legs:  Skin:    General: Skin is warm and dry.     Findings: No abrasion or rash.  Neurological:     Mental Status: He is alert and oriented to person, place, and time. Mental status is at baseline.      GCS: GCS eye subscore is 4. GCS verbal subscore is 5. GCS motor subscore is 6.     Cranial Nerves: Cranial nerves are intact. No cranial nerve deficit.     Sensory: No sensory deficit.     Motor: Motor function is intact.  Psychiatric:        Attention and Perception: Attention normal.        Speech: Speech normal.  Behavior: Behavior normal.    ED Results / Procedures / Treatments   Labs (all labs ordered are listed, but only abnormal results are displayed) Labs Reviewed  CBC WITH DIFFERENTIAL/PLATELET - Abnormal; Notable for the following components:      Result Value   RBC 3.79 (*)    Hemoglobin 12.0 (*)    HCT 35.5 (*)    Abs Immature Granulocytes 0.16 (*)    All other components within normal limits  COMPREHENSIVE METABOLIC PANEL  LIPASE, BLOOD    EKG None  Radiology No results found.  Procedures Procedures   Medications Ordered in ED Medications  lactated ringers infusion (has no administration in time range)  metoCLOPramide (REGLAN) injection 5 mg (has no administration in time range)  lactated ringers bolus 1,000 mL (1,000 mLs Intravenous New Bag/Given 05/07/21 7078)    ED Course  I have reviewed the triage vital signs and the nursing notes.  Pertinent labs & imaging results that were available during my care of the patient were reviewed by me and considered in my medical decision making (see chart for details).    MDM Rules/Calculators/A&P                           Patient given IV fluids and antiemetics x2.  Still is unable to drink liquids here.  Endorses weakness.  Abdominal CT without acute findings.  Will be admitted for GI work-up Final Clinical Impression(s) / ED Diagnoses Final diagnoses:  None    Rx / DC Orders ED Discharge Orders     None        Lacretia Leigh, MD 05/07/21 1426

## 2021-05-07 NOTE — ED Notes (Signed)
Report given to Joe, inpatient RN.

## 2021-05-07 NOTE — ED Triage Notes (Signed)
Pt arrives with daughter and wife.  Daughter reports right hip replacement last Thursday.  Has had decreased appetite and vomiting since surgery.  Feels constant nausea and unable to keep anything down.  Last BM was Wednesday.  Denies fever.  Reports some abdominal pain and tenderness.

## 2021-05-07 NOTE — H&P (Signed)
[facility] History and Physical  Robert Irwin GEX:[id number] DOB: 10-04-1940 DOA: 05/07/2021  Referring physician: EDP Dr. Antony Haste PCP: Wenda Low, MD   Chief Complaint: Intractable nausea vomiting  HPI: Robert Irwin is a 80 y.o. male with past medical history of atrial fibrillation on chronic anticoagulation with Xarelto, well-controlled type 2 diabetes mellitus, hypertension, hyperlipidemia, osteoarthritis of the right hip status post total hip arthroplasty per Dr. Lyla Glassing 04/29/2021 presenting to the ED with a 5 to 7-day history of intractable nausea vomiting diffuse abdominal pain decreased oral intake.  Per patient and daughter patient underwent right hip arthroplasty on 04/29/2021, discharged on 04/30/2021 and subsequently discharged home.  Patient noted to have first bowel movement 4 days prior to admission which was small, hard with associated nausea vomiting diffuse abdominal pain and decreased oral intake.  Patient and family called orthopod's office and over-the-counter enemas were recommended which patient used on Tuesday and Wednesday however patient continued to have decreased appetite with ongoing nausea and unable to keep down any solid foods.  Patient tried liquids, rice porridge 2 days prior to admission however continued to have ongoing emesis since then as well as the day prior to admission and the day of admission with inability to keep anything down including his medications.  Patient denies any fever, no chills, no chest pain, no shortness of breath, no diarrhea, no melena, no hematemesis, no hematochezia.  Patient denies any syncopal episodes.  Patient does endorse diffuse abdominal pain which she describes as sharp and intermittent with some associated constipation, generalized weakness, dizziness.  Patient subsequently presented to the ED.  Patient seen in the ED given IV fluids, IV antiemetics however had ongoing nausea and emesis recommended for admission.  CT abdomen and  pelvis done was unremarkable.  Review of systems noted as above   Review of Systems:  Constitutional:  No weight loss, night sweats, Fevers, chills, fatigue.  HEENT:  No headaches, Difficulty swallowing,Tooth/dental problems,Sore throat,  No sneezing, itching, ear ache, nasal congestion, post nasal drip,  Cardio-vascular:  No chest pain, Orthopnea, PND, swelling in lower extremities, anasarca, dizziness, palpitations  GI:  No heartburn, indigestion, diarrhea, change in bowel habits. Resp:  No shortness of breath with exertion or at rest. No excess mucus, no productive cough, No non-productive cough, No coughing up of blood.No change in color of mucus.No wheezing.No chest wall deformity  Skin:  no rash or lesions.  GU:  no dysuria, change in color of urine, no urgency or frequency. No flank pain.  Musculoskeletal:  No joint pain or swelling. No decreased range of motion. No back pain.  Psych:  No change in mood or affect. No depression or anxiety. No memory loss.   Past Medical History:  Diagnosis Date   A-fib [facility])    Arthritis    Atrial fibrillation ([place])    Diabetes mellitus    Dysrhythmia    ED (erectile dysfunction)    Epistaxis    Hyperlipidemia    Hypertension    Past Surgical History:  Procedure Laterality Date   APPENDECTOMY     NASAL MASS EXCISION     NASAL SINUS SURGERY  12/16/2011   Procedure: ENDOSCOPIC SINUS SURGERY;  Surgeon: Rozetta Nunnery, MD;  Location: [place];  Service: ENT;  Laterality: N/A;  for control of epistaxis   TOTAL HIP ARTHROPLASTY Right 04/29/2021   Procedure: TOTAL HIP ARTHROPLASTY ANTERIOR APPROACH;  Surgeon: Rod Can, MD;  Location: [facility];  Service: Orthopedics;  Laterality: Right;  Social History:  reports that he quit smoking about 30 years ago. His smoking use included cigarettes. He has never used smokeless tobacco. He reports that he does not currently use alcohol. He reports that he does not use drugs.  No Known  Allergies  History reviewed. No pertinent family history.  Mother deceased at 66 from old age.  Father deceased in his 35s from natural causes per patient.  Prior to Admission medications   Medication Sig Start Date End Date Taking? Authorizing Provider  acetaminophen (TYLENOL) 325 MG tablet Take 1-2 tablets (325-650 mg total) by mouth every 6 (six) hours as needed for mild pain (pain score 1-3 or temp > 100.5). 04/30/21  Yes Allen Norris B, PA-C  atorvastatin (LIPITOR) 20 MG tablet TAKE 1 TABLET EVERY DAY (KEEP UPCOMING MD APPOINTMENT FOR REFILLS) 12/17/20  Yes Jerline Pain, MD  calcium citrate-vitamin D 200-200 MG-UNIT TABS Take 1 tablet by mouth daily.   Yes [provider]  docusate sodium (COLACE) 100 MG capsule Take 1 capsule (100 mg total) by mouth 2 (two) times daily. 04/30/21  Yes Benedetto Goad, PA-C  glimepiride (AMARYL) 2 MG tablet Take 1 mg by mouth daily before breakfast.   Yes [provider]  Glucosamine-Chondroitin (OSTEO BI-FLEX REGULAR STRENGTH PO) Take 1 tablet by mouth 2 (two) times daily.   Yes [provider]  HYDROcodone-acetaminophen (NORCO/VICODIN) 5-325 MG tablet Take 1-2 tablets by mouth every 4 (four) hours as needed for moderate pain (pain score 4-6). 04/30/21  Yes Benedetto Goad, PA-C  Multiple Vitamin (MULITIVITAMIN WITH MINERALS) TABS Take 1 tablet by mouth daily.   Yes [provider]  ondansetron (ZOFRAN) 4 MG tablet Take 1 tablet (4 mg total) by mouth every 6 (six) hours as needed for nausea. 04/30/21  Yes Benedetto Goad, PA-C  rivaroxaban (XARELTO) 20 MG TABS tablet TAKE 1 TABLET EVERY DAY WITH SUPPER.  Pt Overdue for follow-up, MUST see MD for FUTURE refills. 01/21/21  Yes Jerline Pain, MD  senna (SENOKOT) 8.6 MG TABS tablet Take 1 tablet (8.6 mg total) by mouth 2 (two) times daily. 04/30/21  Yes Benedetto Goad, PA-C  TIADYLT ER 240 MG 24 hr capsule TAKE 1 CAPSULE EVERY DAY (PLEASE KEEP [facility] APPOINTMENT IN JULY 2022 FOR  FUTURE REFILLS. THANK YOU) 04/27/21  Yes Jerline Pain, MD  colchicine 0.6 MG tablet Take 1 tablet (0.6 mg total) by mouth 2 (two) times daily for 6 days. 04/30/21 05/06/21  Benedetto Goad, PA-C  traMADol (ULTRAM) 50 MG tablet Take 50 mg by mouth every 6 (six) hours as needed. 04/26/21   [provider]   Physical Exam: Vitals:   05/07/21 1245 05/07/21 1415 05/07/21 1530 05/07/21 1653  BP: 118/73 (!) 147/77 139/83 (!) 145/78  Pulse: 99 98 95 94  Resp: 17 (!) 21 14 16   Temp:    98 F (36.7 C)  TempSrc:    Oral  SpO2: 99% 96% 99% 100%  Weight:    64 kg  Height:    5\' 7"  (1.702 m)    Wt Readings from Last 3 Encounters:  05/07/21 64 kg  04/29/21 68 kg  04/23/21 68 kg    General:  Appears calm and comfortable.  Dry mucous membranes Eyes: PERRL, normal lids, irises & conjunctiva ENT: grossly normal hearing, lips & tongue Neck: no LAD, masses or thyromegaly Cardiovascular: RRR, no m/r/g.  Trace right lower extremity edema. Respiratory: CTA bilaterally, no w/r/r. Normal respiratory effort. Abdomen:  soft, some diffuse tenderness to palpation in the mid to lower abdominal region, some epigastric tenderness to palpation.  Positive bowel sounds.  No rebound.  No guarding.  Skin: no rash or induration seen on limited exam Musculoskeletal: grossly normal tone BUE/BLE.  Right hip with dressing in place with some tenderness to palpation.  No significant ecchymosis noted. Psychiatric: grossly normal mood and affect, speech fluent and appropriate Neurologic: Alert and oriented to self place and time.  No focal neurological deficits.  Moving extremities spontaneously.            Labs on Admission:  Basic Metabolic Panel: Recent Labs  Lab 05/07/21 0854  NA 136  K 3.8  CL 98  CO2 27  GLUCOSE 131*  BUN 12  CREATININE 0.80  CALCIUM 9.1   Liver Function Tests: Recent Labs  Lab 05/07/21 0854  AST 29  ALT 31  ALKPHOS 74  BILITOT 1.3*  PROT 6.6  ALBUMIN 3.8   Recent Labs   Lab 05/07/21 0854  LIPASE 26   No results for input(s): AMMONIA in the last 168 hours. CBC: Recent Labs  Lab 05/07/21 0854  WBC 9.0  NEUTROABS 6.9  HGB 12.0*  HCT 35.5*  MCV 93.7  PLT 314   Cardiac Enzymes: No results for input(s): CKTOTAL, CKMB, CKMBINDEX, TROPONINI in the last 168 hours.  BNP (last 3 results) No results for input(s): BNP in the last 8760 hours.  ProBNP (last 3 results) No results for input(s): PROBNP in the last 8760 hours.  CBG: No results for input(s): GLUCAP in the last 168 hours.  Radiological Exams on Admission: CT Abdomen Pelvis W Contrast  Result Date: 05/07/2021 CLINICAL DATA:  Abdominal distension EXAM: CT ABDOMEN AND PELVIS WITH CONTRAST TECHNIQUE: Multidetector CT imaging of the abdomen and pelvis was performed using the standard protocol following bolus administration of intravenous contrast. CONTRAST:  128mL OMNIPAQUE IOHEXOL 300 MG/ML  SOLN COMPARISON:  None. FINDINGS: Lower chest: No acute abnormality. Hepatobiliary: No suspicious focal liver lesion. Gallbladder is mildly distended contains gallstones with no evidence of gallbladder wall thickening. No biliary ductal dilation. Pancreas: Unremarkable. No pancreatic ductal dilatation or surrounding inflammatory changes. Spleen: Normal in size without focal abnormality. Adrenals/Urinary Tract: Adrenal glands are unremarkable. Kidneys are normal, without renal calculi, focal lesion, or hydronephrosis. Bladder is unremarkable. Stomach/Bowel: Stomach is within normal limits. Appendix is not visualized. No evidence of bowel wall thickening, distention, or inflammatory changes. Vascular/Lymphatic: Aortic atherosclerosis. No enlarged abdominal or pelvic lymph nodes. Reproductive: Calcifications of the prostate. Other: No abdominal wall hernia or abnormality. No abdominopelvic ascites. Musculoskeletal: Postsurgical changes of right total hip arthroplasty including small locules of air in the surrounding soft  tissues and overlying skin closure staples. Moderate degenerative disc disease of the lumbar spine. Otherwise, no acute osseous abnormality. IMPRESSION: No acute CT findings in the abdomen or pelvis. Postsurgical changes of right hip replacement. Aortic Atherosclerosis (ICD10-I70.0). Electronically Signed   By: Yetta Glassman M.D.   On: 05/07/2021 11:43    EKG: Not done.  Assessment/Plan Principal Problem:   Nausea and vomiting Active Problems:   Hyperlipidemia   Diabetes mellitus (HCC)   Atrial fibrillation with RVR (HCC)   Osteoarthritis of right hip   Weakness   Dehydration   Constipation   Intractable nausea/vomiting/abdominal pain -Questionable etiology.  Likely secondary to constipation as patient with recent hip surgery, on narcotic pain medications, noted to be constipated prior to admission with no significant improvement with over-the-counter enemas.         -  Lipase levels within normal limits.         -CT abdomen and pelvis with no acute abdominal findings.          -Placed on clear liquids, IV fluids, scheduled Zofran every 8 hours x2 to 3 days.  We will give a smog enema.  Supportive care. -If no significant improvement with ongoing nausea and vomiting we will consult with GI for further evaluation and management.  2.  A. fib -Currently in sinus rhythm. -Due to nausea and vomiting and inability to keep anything down we will hold calcium channel blocker and placed on IV Lopressor 2.5 mg every 8 hours for rate control. -Resume home regimen Xarelto for anticoagulation, if unable to tolerate or put on full dose Lovenox.  3.  Dehydration -IV fluids.  4.  Constipation -Likely secondary to narcotic pain medications in the setting of recent surgery. -We will give a smog enema today. -Dulcolax suppositories daily. -Senokot-S twice daily.  5.  Generalized weakness -Likely secondary to problem #1 with decreased oral intake and ongoing nausea and vomiting.  Patient also with  recent right hip surgery. -PT/OT.  6.  Status post right hip arthroplasty 04/29/2021 -Pain management, PT/OT. -Continue Xarelto for anticoagulation. -We will need outpatient follow-up with orthopedics post discharge.  7.  Well-controlled diabetes mellitus type 2 -Hemoglobin A1c 6.3 (04/23/2021). -Hold oral hypoglycemic agents. -Check CBGs every 6 hours. -SSI.   Code Status: Full DVT Prophylaxis: Xarelto Family Communication: Updated patient and daughter at bedside. Disposition Plan: Place in observation./Telemetry.  Time spent: 60 minutes  [facility] Pager [phone]

## 2021-05-07 NOTE — ED Notes (Addendum)
Attempted to call report to inpatient unit RN, Wille Glaser.  RN unavailable at this time and will call me back for report.

## 2021-05-07 NOTE — Progress Notes (Signed)
Enema administered with mediocre results.

## 2021-05-07 NOTE — ED Notes (Signed)
ED Provider at bedside. 

## 2021-05-07 NOTE — ED Notes (Signed)
Pt given oral contrast to drink;   Pt unable to drink, RN was notified by pt, and RN notified CT

## 2021-05-08 DIAGNOSIS — E860 Dehydration: Secondary | ICD-10-CM | POA: Diagnosis not present

## 2021-05-08 DIAGNOSIS — E119 Type 2 diabetes mellitus without complications: Secondary | ICD-10-CM | POA: Diagnosis not present

## 2021-05-08 DIAGNOSIS — R112 Nausea with vomiting, unspecified: Secondary | ICD-10-CM | POA: Diagnosis not present

## 2021-05-08 DIAGNOSIS — Z96641 Presence of right artificial hip joint: Secondary | ICD-10-CM | POA: Diagnosis not present

## 2021-05-08 DIAGNOSIS — I4891 Unspecified atrial fibrillation: Secondary | ICD-10-CM | POA: Diagnosis not present

## 2021-05-08 DIAGNOSIS — Z20822 Contact with and (suspected) exposure to covid-19: Secondary | ICD-10-CM | POA: Diagnosis not present

## 2021-05-08 DIAGNOSIS — E785 Hyperlipidemia, unspecified: Secondary | ICD-10-CM | POA: Diagnosis not present

## 2021-05-08 DIAGNOSIS — R531 Weakness: Secondary | ICD-10-CM | POA: Diagnosis not present

## 2021-05-08 DIAGNOSIS — Z87891 Personal history of nicotine dependence: Secondary | ICD-10-CM | POA: Diagnosis not present

## 2021-05-08 DIAGNOSIS — M1611 Unilateral primary osteoarthritis, right hip: Secondary | ICD-10-CM | POA: Diagnosis not present

## 2021-05-08 DIAGNOSIS — Z79899 Other long term (current) drug therapy: Secondary | ICD-10-CM | POA: Diagnosis not present

## 2021-05-08 DIAGNOSIS — K5900 Constipation, unspecified: Secondary | ICD-10-CM | POA: Diagnosis not present

## 2021-05-08 LAB — COMPREHENSIVE METABOLIC PANEL
ALT: 28 U/L (ref 0–44)
AST: 26 U/L (ref 15–41)
Albumin: 3 g/dL — ABNORMAL LOW (ref 3.5–5.0)
Alkaline Phosphatase: 60 U/L (ref 38–126)
Anion gap: 11 (ref 5–15)
BUN: 10 mg/dL (ref 8–23)
CO2: 24 mmol/L (ref 22–32)
Calcium: 8.4 mg/dL — ABNORMAL LOW (ref 8.9–10.3)
Chloride: 101 mmol/L (ref 98–111)
Creatinine, Ser: 0.64 mg/dL (ref 0.61–1.24)
GFR, Estimated: >60 mL/min (ref >60)
Glucose, Bld: 84 mg/dL (ref 70–99)
Potassium: 3.6 mmol/L (ref 3.5–5.1)
Sodium: 136 mmol/L (ref 135–145)
Total Bilirubin: 1.5 mg/dL — ABNORMAL HIGH (ref 0.3–1.2)
Total Protein: 5.6 g/dL — ABNORMAL LOW (ref 6.5–8.1)

## 2021-05-08 LAB — CBC
HCT: 31.7 % — ABNORMAL LOW (ref 39.0–52.0)
Hemoglobin: 10.8 g/dL — ABNORMAL LOW (ref 13.0–17.0)
MCH: 32.2 pg (ref 26.0–34.0)
MCHC: 34.1 g/dL (ref 30.0–36.0)
MCV: 94.6 fL (ref 80.0–100.0)
Platelets: 261 10*3/uL (ref 150–400)
RBC: 3.35 MIL/uL — ABNORMAL LOW (ref 4.22–5.81)
RDW: 12.7 % (ref 11.5–15.5)
WBC: 8.4 10*3/uL (ref 4.0–10.5)
nRBC: 0 % (ref 0.0–0.2)

## 2021-05-08 LAB — IRON AND TIBC
Iron: 57 ug/dL (ref 45–182)
Saturation Ratios: 20 % (ref 17.9–39.5)
TIBC: 282 ug/dL (ref 250–450)
UIBC: 225 ug/dL

## 2021-05-08 LAB — GLUCOSE, CAPILLARY
Glucose-Capillary: 106 mg/dL — ABNORMAL HIGH (ref 70–99)
Glucose-Capillary: 112 mg/dL — ABNORMAL HIGH (ref 70–99)
Glucose-Capillary: 115 mg/dL — ABNORMAL HIGH (ref 70–99)
Glucose-Capillary: 126 mg/dL — ABNORMAL HIGH (ref 70–99)
Glucose-Capillary: 167 mg/dL — ABNORMAL HIGH (ref 70–99)
Glucose-Capillary: 169 mg/dL — ABNORMAL HIGH (ref 70–99)
Glucose-Capillary: 78 mg/dL (ref 70–99)

## 2021-05-08 LAB — PHOSPHORUS: Phosphorus: 3.4 mg/dL (ref 2.5–4.6)

## 2021-05-08 LAB — MAGNESIUM: Magnesium: 2 mg/dL (ref 1.7–2.4)

## 2021-05-08 LAB — FOLATE: Folate: 22 ng/mL (ref >5.9)

## 2021-05-08 LAB — FERRITIN: Ferritin: 186 ng/mL (ref 24–336)

## 2021-05-08 LAB — VITAMIN B12: Vitamin B-12: 1329 pg/mL — ABNORMAL HIGH (ref 180–914)

## 2021-05-08 MED ORDER — POLYETHYLENE GLYCOL 3350 17 G PO PACK
17.0000 g | PACK | Freq: Two times a day (BID) | ORAL | Status: DC
  Administered 2021-05-08: 17 g via ORAL
  Filled 2021-05-08: qty 1

## 2021-05-08 MED ORDER — POLYETHYLENE GLYCOL 3350 17 G PO PACK: 17.0000 g | PACK | Freq: Every day | ORAL | Status: DC

## 2021-05-08 MED ORDER — TRAMADOL HCL 50 MG PO TABS
50.0000 mg | ORAL_TABLET | Freq: Four times a day (QID) | ORAL | Status: DC | PRN
  Administered 2021-05-08: 50 mg via ORAL
  Filled 2021-05-08: qty 1

## 2021-05-08 MED ORDER — POTASSIUM CHLORIDE CRYS ER 10 MEQ PO TBCR
20.0000 meq | EXTENDED_RELEASE_TABLET | Freq: Once | ORAL | Status: AC
  Administered 2021-05-08: 20 meq via ORAL
  Filled 2021-05-08: qty 2

## 2021-05-08 MED ORDER — DEXTROSE-NACL 5-0.9 % IV SOLN: INTRAVENOUS | Status: DC

## 2021-05-08 MED ORDER — HYDROCORTISONE ACETATE 25 MG RE SUPP
25.0000 mg | Freq: Two times a day (BID) | RECTAL | Status: DC
  Administered 2021-05-08 – 2021-05-09 (×3): 25 mg via RECTAL
  Filled 2021-05-08 (×3): qty 1

## 2021-05-08 MED ORDER — ACETAMINOPHEN 500 MG PO TABS
500.0000 mg | ORAL_TABLET | Freq: Three times a day (TID) | ORAL | Status: DC
  Administered 2021-05-09: 500 mg via ORAL
  Filled 2021-05-08: qty 1

## 2021-05-08 MED ORDER — SENNOSIDES-DOCUSATE SODIUM 8.6-50 MG PO TABS
1.0000 | ORAL_TABLET | Freq: Two times a day (BID) | ORAL | Status: DC
  Administered 2021-05-09: 1 via ORAL
  Filled 2021-05-08: qty 1

## 2021-05-08 MED ORDER — SENNOSIDES-DOCUSATE SODIUM 8.6-50 MG PO TABS
1.0000 | ORAL_TABLET | Freq: Two times a day (BID) | ORAL | Status: DC
  Administered 2021-05-08: 1 via ORAL
  Filled 2021-05-08: qty 1

## 2021-05-08 MED ORDER — METOPROLOL TARTRATE 5 MG/5ML IV SOLN
5.0000 mg | Freq: Three times a day (TID) | INTRAVENOUS | Status: DC
  Administered 2021-05-08 – 2021-05-09 (×3): 5 mg via INTRAVENOUS
  Filled 2021-05-08 (×3): qty 5

## 2021-05-08 NOTE — Progress Notes (Signed)
    Subjective:  Patient reports pain as mild. Admitted for intractable N/V, inability to hold down POs. Received SMOG enema last night with great effect. Currently denies nausea. No emesis since yesterday. Diet progressed to full liquids this am. Had applesauce and cranberry juice so far. Imaging reviewed - R THA intact.  Objective:   VITALS:   Vitals:   05/07/21 1653 05/07/21 2056 05/08/21 0050 05/08/21 0506  BP: (!) 145/78 131/70 120/68 129/73  Pulse: 94 (!) 103 97 (!) 105  Resp: 16 18 18 18   Temp: 98 F (36.7 C) 98.6 F (37 C) 98.1 F (36.7 C) 98.2 F (36.8 C)  TempSrc: Oral   Oral  SpO2: 100% 96% 97% 95%  Weight: 64 kg   67.9 kg  Height: 5\' 7"  (1.702 m)       NAD Abd NNTP, no distension ABD soft Sensation intact distally Intact pulses distally Dorsiflexion/Plantar flexion intact Incision: dressing C/D/I   Lab Results  Component Value Date   WBC 8.4 05/08/2021   HGB 10.8 (L) 05/08/2021   HCT 31.7 (L) 05/08/2021   MCV 94.6 05/08/2021   PLT 261 05/08/2021   BMET    Component Value Date/Time   NA 136 05/08/2021 0518   NA 139 01/15/2018 0832   K 3.6 05/08/2021 0518   CL 101 05/08/2021 0518   CO2 24 05/08/2021 0518   GLUCOSE 84 05/08/2021 0518   BUN 10 05/08/2021 0518   BUN 10 01/15/2018 0832   CREATININE 0.64 05/08/2021 0518   CALCIUM 8.4 (L) 05/08/2021 0518   GFRNONAA >60 05/08/2021 0518     Assessment/Plan:     Principal Problem:   Nausea and vomiting Active Problems:   Hyperlipidemia   Diabetes mellitus (HCC)   Atrial fibrillation with RVR (HCC)   Osteoarthritis of right hip   Weakness   Dehydration   Constipation   WBAT with walker DVT ppx: Xarelto, SCDs, TEDS PO pain control. Stop narcotics. Give tylenol and PRN tramadol if needed Bowel regimen Appreciate [facility] care Dispo: D/C home when medically ready, keep f/u with me on Friday   Robert Irwin Robert Irwin 05/08/2021, 10:26 AM   Rod Can, MD [phone] [facility] now [facility]  [facility] [address], [place], [place], [place] [address] Phone: [phone] [email or web address] Facebook  [employer]

## 2021-05-08 NOTE — Progress Notes (Addendum)
PROGRESS NOTE    Robert TROTTA  KYH:[id number] DOB: Oct 27, 1940 DOA: 05/07/2021 PCP: Wenda Low, MD    Chief Complaint  Patient presents with   Emesis    Brief Narrative: Patient pleasant 80 year old gentleman history of A. fib on chronic anticoagulation with Xarelto, type 2 diabetes, hypertension, hyperlipidemia, osteoarthritis of the right hip status post recent total hip arthroplasty 04/29/2021 presented to the ED with a 5 to 7-day history of intractable nausea vomiting diffuse abdominal pain and decreased oral intake.   Assessment & Plan:   Principal Problem:   Nausea and vomiting Active Problems:   Hyperlipidemia   Diabetes mellitus (HCC)   Atrial fibrillation with RVR (HCC)   Osteoarthritis of right hip   Weakness   Dehydration   Constipation  Intractable nausea/vomiting/abdominal pain -Likely secondary to constipation secondary to narcotic pain medications, as patient with recent hip surgery, on narcotic pain medications, noted to be constipated prior to admission with no significant improvement with over-the-counter enemas.         -Lipase levels within normal limits.         -CT abdomen and pelvis with no acute abdominal findings.          -Tolerated clear liquids and diet advanced to a full liquid diet.  -Clinical improvement on scheduled IV Zofran, IV fluids, supportive care.  -Patient received a smog enema overnight with some bowel movement.  -Patient placed on a bowel regimen of MiraLAX twice daily, Senokot-S twice daily and Dulcolax suppositories daily.   -Advance diet as tolerated to a soft diet.   -IV fluids, supportive care.    2.  A. fib -Currently in sinus rhythm. -Due to nausea and vomiting and inability to keep anything down oral calcium channel blocker held and patient on IV Lopressor 2.5 mg every 8 hours for rate control.   -Increase IV Lopressor to 5 mg every 8 hours and when tolerating oral intake we will transition back to home regimen oral calcium  channel blocker.   -Continue Xarelto for anticoagulation.    3.  Dehydration -IV fluids.  4.  Constipation -Likely secondary to narcotic pain medications in the setting of recent surgery. -Status post smog enema with some bowel movement overnight.   -Dulcolax suppository daily ordered.   -Placed on MiraLAX twice daily and Senokot-S twice daily.  -Narcotics discontinued. -Supportive care.   5.  Generalized weakness -Likely secondary to problem #1 with decreased oral intake and ongoing nausea and vomiting.  Patient also with recent right hip surgery. -PT/OT.  6.  Status post right hip arthroplasty 04/29/2021 -Pain management, PT/OT. -Continue Xarelto for anticoagulation. -Patient seen in consultation by orthopedics Dr. Lyla Glassing and recommending WBAT with walker.   -Narcotics discontinued per orthopedic recommendations. -Ultram as needed. -We will place on scheduled Tylenol 500 mg 3 times daily. -PT/OT ordered.   -Outpatient follow-up with orthopedics.  7.  Well-controlled diabetes mellitus type 2 -Hemoglobin A1c 6.3 (04/23/2021). -Blood glucose level noted to be 84 on labs this morning.   -Patient however asymptomatic.   -Continue to hold oral hypoglycemic agents.   -Change CBGs to before meals and at bedtime.   -Place on D5 normal saline.   -SSI.   8.  Hemorrhoids -Anusol suppository twice daily.   DVT prophylaxis: Xarelto/TED hose Code Status: Full Family Communication: Updated patient and daughter at bedside. Disposition:   Status is: Observation  The patient remains OBS appropriate and will d/c before 2 midnights.      Consultants:  Orthopedics: Dr.  Swinteck 05/08/2021  Procedures:  CT abdomen and pelvis 05/07/2021    Antimicrobials:  None   Subjective: Patient sitting up in bed.  Feels a little bit better than on admission.  Seems to be tolerating clear liquids.  Noted to have had bowel movement after enema yesterday.  Improvement with abdominal  pain.  Denies any nausea or emesis.  Per RN patient noted to have some occasional transient bouts of sinus tachycardia with heart rates in the 170s when having a bowel movement however quickly improved to the 90s.  Objective: Vitals:   05/07/21 1653 05/07/21 2056 05/08/21 0050 05/08/21 0506  BP: (!) 145/78 131/70 120/68 129/73  Pulse: 94 (!) 103 97 (!) 105  Resp: 16 18 18 18   Temp: 98 F (36.7 C) 98.6 F (37 C) 98.1 F (36.7 C) 98.2 F (36.8 C)  TempSrc: Oral   Oral  SpO2: 100% 96% 97% 95%  Weight: 64 kg   67.9 kg  Height: 5\' 7"  (1.702 m)       Intake/Output Summary (Last 24 hours) at 05/08/2021 1124 Last data filed at 05/08/2021 0438 Gross per 24 hour  Intake 3527.02 ml  Output --  Net 3527.02 ml   Filed Weights   05/07/21 0856 05/07/21 1653 05/08/21 0506  Weight: 64.9 kg 64 kg 67.9 kg    Examination:  General exam: Appears calm and comfortable  Respiratory system: Clear to auscultation. Respiratory effort normal. Cardiovascular system: S1 & S2 heard, RRR. No JVD, murmurs, rubs, gallops or clicks. No pedal edema. Gastrointestinal system: Abdomen is nondistended, soft and nontender. No organomegaly or masses felt. Normal bowel sounds heard. Central nervous system: Alert and oriented. No focal neurological deficits. Extremities: Right hip with dressing in place.  Some tenderness to palpation.  Skin: No rashes, lesions or ulcers Psychiatry: Judgement and insight appear normal. Mood & affect appropriate.     Data Reviewed: I have personally reviewed following labs and imaging studies  CBC: Recent Labs  Lab 05/07/21 0854 05/08/21 0518  WBC 9.0 8.4  NEUTROABS 6.9  --   HGB 12.0* 10.8*  HCT 35.5* 31.7*  MCV 93.7 94.6  PLT 314 979    Basic Metabolic Panel: Recent Labs  Lab 05/07/21 0854 05/08/21 0518  NA 136 136  K 3.8 3.6  CL 98 101  CO2 27 24  GLUCOSE 131* 84  BUN 12 10  CREATININE 0.80 0.64  CALCIUM 9.1 8.4*  MG  --  2.0  PHOS  --  3.4     GFR: Estimated Creatinine Clearance: 70 mL/min (by C-G formula based on SCr of 0.64 mg/dL).  Liver Function Tests: Recent Labs  Lab 05/07/21 0854 05/08/21 0518  AST 29 26  ALT 31 28  ALKPHOS 74 60  BILITOT 1.3* 1.5*  PROT 6.6 5.6*  ALBUMIN 3.8 3.0*    CBG: Recent Labs  Lab 05/07/21 1905 05/08/21 0000 05/08/21 0507 05/08/21 0800  GLUCAP 87 112* 78 106*     Recent Results (from the past 240 hour(s))  SARS Coronavirus 2 by RT PCR (hospital order, performed in [facility] hospital lab) Nasopharyngeal Nasopharyngeal Swab     Status: None   Collection Time: 04/29/21 11:38 AM   Specimen: Nasopharyngeal Swab  Result Value Ref Range Status   SARS Coronavirus 2 NEGATIVE NEGATIVE Final    Comment: (NOTE) SARS-CoV-2 target nucleic acids are NOT DETECTED.  The SARS-CoV-2 RNA is generally detectable in upper and lower respiratory specimens during the acute phase of infection. The lowest concentration  of SARS-CoV-2 viral copies this assay can detect is 250 copies / mL. A negative result does not preclude SARS-CoV-2 infection and should not be used as the sole basis for treatment or other patient management decisions.  A negative result may occur with improper specimen collection / handling, submission of specimen other than nasopharyngeal swab, presence of viral mutation(s) within the areas targeted by this assay, and inadequate number of viral copies (<250 copies / mL). A negative result must be combined with clinical observations, patient history, and epidemiological information.  Fact Sheet for Patients:   StrictlyIdeas.no  Fact Sheet for Healthcare Providers: BankingDealers.co.za  This test is not yet approved or  cleared by the [place] FDA and has been authorized for detection and/or diagnosis of SARS-CoV-2 by FDA under an Emergency Use Authorization (EUA).  This EUA will remain in effect (meaning this test can  be used) for the duration of the COVID-19 declaration under Section 564(b)(1) of the Act, 21 U.S.C. section 360bbb-3(b)(1), unless the authorization is terminated or revoked sooner.  Performed at [facility], [place] [address] [place], [place] [address]   Resp Panel by RT-PCR (Flu A&B, Covid) Nasopharyngeal Swab     Status: None   Collection Time: 05/07/21 12:57 PM   Specimen: Nasopharyngeal Swab; Nasopharyngeal(NP) swabs in vial transport medium  Result Value Ref Range Status   SARS Coronavirus 2 by RT PCR NEGATIVE NEGATIVE Final    Comment: (NOTE) SARS-CoV-2 target nucleic acids are NOT DETECTED.  The SARS-CoV-2 RNA is generally detectable in upper respiratory specimens during the acute phase of infection. The lowest concentration of SARS-CoV-2 viral copies this assay can detect is 138 copies/mL. A negative result does not preclude SARS-Cov-2 infection and should not be used as the sole basis for treatment or other patient management decisions. A negative result may occur with  improper specimen collection/handling, submission of specimen other than nasopharyngeal swab, presence of viral mutation(s) within the areas targeted by this assay, and inadequate number of viral copies(<138 copies/mL). A negative result must be combined with clinical observations, patient history, and epidemiological information. The expected result is Negative.  Fact Sheet for Patients:  EntrepreneurPulse.com.au  Fact Sheet for Healthcare Providers:  IncredibleEmployment.be  This test is no t yet approved or cleared by the [place] FDA and  has been authorized for detection and/or diagnosis of SARS-CoV-2 by FDA under an Emergency Use Authorization (EUA). This EUA will remain  in effect (meaning this test can be used) for the duration of the COVID-19 declaration under Section 564(b)(1) of the Act, 21 U.S.C.section 360bbb-3(b)(1), unless the  authorization is terminated  or revoked sooner.       Influenza A by PCR NEGATIVE NEGATIVE Final   Influenza B by PCR NEGATIVE NEGATIVE Final    Comment: (NOTE) The Xpert Xpress SARS-CoV-2/FLU/RSV plus assay is intended as an aid in the diagnosis of influenza from Nasopharyngeal swab specimens and should not be used as a sole basis for treatment. Nasal washings and aspirates are unacceptable for Xpert Xpress SARS-CoV-2/FLU/RSV testing.  Fact Sheet for Patients: EntrepreneurPulse.com.au  Fact Sheet for Healthcare Providers: IncredibleEmployment.be  This test is not yet approved or cleared by the [place] FDA and has been authorized for detection and/or diagnosis of SARS-CoV-2 by FDA under an Emergency Use Authorization (EUA). This EUA will remain in effect (meaning this test can be used) for the duration of the COVID-19 declaration under Section 564(b)(1) of the Act, 21 U.S.C. section 360bbb-3(b)(1), unless the authorization is  terminated or revoked.  Performed at [facility], [address] [place], [place] [address]          Radiology Studies: CT Abdomen Pelvis W Contrast  Result Date: 05/07/2021 CLINICAL DATA:  Abdominal distension EXAM: CT ABDOMEN AND PELVIS WITH CONTRAST TECHNIQUE: Multidetector CT imaging of the abdomen and pelvis was performed using the standard protocol following bolus administration of intravenous contrast. CONTRAST:  126mL OMNIPAQUE IOHEXOL 300 MG/ML  SOLN COMPARISON:  None. FINDINGS: Lower chest: No acute abnormality. Hepatobiliary: No suspicious focal liver lesion. Gallbladder is mildly distended contains gallstones with no evidence of gallbladder wall thickening. No biliary ductal dilation. Pancreas: Unremarkable. No pancreatic ductal dilatation or surrounding inflammatory changes. Spleen: Normal in size without focal abnormality. Adrenals/Urinary Tract: Adrenal glands are unremarkable.  Kidneys are normal, without renal calculi, focal lesion, or hydronephrosis. Bladder is unremarkable. Stomach/Bowel: Stomach is within normal limits. Appendix is not visualized. No evidence of bowel wall thickening, distention, or inflammatory changes. Vascular/Lymphatic: Aortic atherosclerosis. No enlarged abdominal or pelvic lymph nodes. Reproductive: Calcifications of the prostate. Other: No abdominal wall hernia or abnormality. No abdominopelvic ascites. Musculoskeletal: Postsurgical changes of right total hip arthroplasty including small locules of air in the surrounding soft tissues and overlying skin closure staples. Moderate degenerative disc disease of the lumbar spine. Otherwise, no acute osseous abnormality. IMPRESSION: No acute CT findings in the abdomen or pelvis. Postsurgical changes of right hip replacement. Aortic Atherosclerosis (ICD10-I70.0). Electronically Signed   By: Yetta Glassman M.D.   On: 05/07/2021 11:43        Scheduled Meds:  bisacodyl  10 mg Rectal Daily   insulin aspart  0-9 Units Subcutaneous Q6H   metoprolol tartrate  5 mg Intravenous Q8H   ondansetron (ZOFRAN) IV  4 mg Intravenous TID   pantoprazole (PROTONIX) IV  40 mg Intravenous Q24H   polyethylene glycol  17 g Oral BID   rivaroxaban  20 mg Oral Q supper   senna-docusate  1 tablet Oral BID   sodium chloride flush  3 mL Intravenous Q12H   Continuous Infusions:  lactated ringers 125 mL/hr at 05/08/21 0438   methocarbamol (ROBAXIN) IV       LOS: 0 days    Time spent: 40 minutes    Irine Seal, MD [facility]   To contact the attending provider between 7A-7P or the covering provider during after hours 7P-7A, please log into the web site [email or web address] and access using universal [place] password for that web site. If you do not have the password, please call the hospital operator.  05/08/2021, 11:24 AM

## 2021-05-08 NOTE — Evaluation (Signed)
Physical Therapy Evaluation Patient Details Name: Robert Irwin MRN: [id number] DOB: 24-Aug-1940 Today's Date: 05/08/2021  History of Present Illness  Robert Irwin is a 80 year old male who was admitted to the hosptial with abdominal pain, nausea/vomitting and dehydration. of note patinet is s/p R THA AA due to arthritis and subchondral fracture from 10/6. PMH: afib, diabetes, HTN  Clinical Impression  Pt admitted with above diagnosis. Pt currently with functional limitations due to the deficits listed below (see PT Problem List). Pt will benefit from skilled PT to increase their independence and safety with mobility to allow discharge to the venue listed below.  Pt needing to go to the bathroom urgently upon arrival and lunch was there at end of session, so verbally reviewed his exercise plan.  Recommend HHPT.      Recommendations for follow up therapy are one component of a multi-disciplinary discharge planning process, led by the attending physician.  Recommendations may be updated based on patient status, additional functional criteria and insurance authorization.  Follow Up Recommendations Follow surgeon's recommendation for DC plan and follow-up therapies;Home health PT    Equipment Recommendations  None recommended by PT    Recommendations for Other Services       Precautions / Restrictions Precautions Precautions: Fall Precaution Comments: monitor HR Restrictions Weight Bearing Restrictions: No RLE Weight Bearing: Weight bearing as tolerated      Mobility  Bed Mobility Overal bed mobility: Needs Assistance Bed Mobility: Supine to Sit     Supine to sit: Min assist     General bed mobility comments: MIN A from daughter    Transfers Overall transfer level: Needs assistance Equipment used: Rolling walker (2 wheeled) Transfers: Sit to/from Stand Sit to Stand: Min guard         General transfer comment: stood from bed and toilet  Ambulation/Gait Ambulation/Gait  assistance: Supervision Gait Distance (Feet): 140 Feet Assistive device: Rolling walker (2 wheeled) Gait Pattern/deviations: Step-through pattern;Decreased stance time - right Gait velocity: decreased   General Gait Details: c/o 5/10 pain with gait, but good cadence  Stairs            Wheelchair Mobility    Modified Rankin (Stroke Patients Only)       Balance Overall balance assessment: Mild deficits observed, not formally tested           Standing balance-Leahy Scale: Fair                               Pertinent Vitals/Pain Pain Assessment: 0-10 Pain Score: 5  Faces Pain Scale: Hurts little more Pain Location: R hip Pain Descriptors / Indicators: Sore Pain Intervention(s): Limited activity within patient's tolerance;Monitored during session    Home Living Family/patient expects to be discharged to:: Private residence Living Arrangements: Spouse/significant other;Children Available Help at Discharge: Family;Available 24 hours/day Type of Home: House Home Access: Stairs to enter Entrance Stairs-Rails: None Entrance Stairs-Number of Steps: 3 Home Layout: Two level;Able to live on main level with bedroom/bathroom Home Equipment: None Additional Comments: Pt able to stay on main level while recovering    Prior Function Level of Independence: Independent         Comments: Pt independent with community ambulation and self care tasks with increased pain     Hand Dominance   Dominant Hand: Right    Extremity/Trunk Assessment   Upper Extremity Assessment Upper Extremity Assessment: Defer to OT evaluation  Lower Extremity Assessment Lower Extremity Assessment: RLE deficits/detail RLE Deficits / Details: R hip/knee ROM decreased by ~ 25%    Cervical / Trunk Assessment Cervical / Trunk Assessment: Normal  Communication   Communication: No difficulties  Cognition Arousal/Alertness: Awake/alert Behavior During Therapy: WFL for tasks  assessed/performed Overall Cognitive Status: Within Functional Limits for tasks assessed                                        General Comments      Exercises     Assessment/Plan    PT Assessment Patient needs continued PT services  PT Problem List Decreased strength;Decreased range of motion;Decreased activity tolerance;Decreased balance;Decreased mobility;Decreased knowledge of use of DME;Impaired sensation;Pain       PT Treatment Interventions DME instruction;Gait training;Stair training;Functional mobility training;Therapeutic activities;Therapeutic exercise;Balance training;Patient/family education    PT Goals (Current goals can be found in the Care Plan section)  Acute Rehab PT Goals Patient Stated Goal: return home PT Goal Formulation: With patient/family Time For Goal Achievement: 05/22/21 Potential to Achieve Goals: Good    Frequency 7X/week   Barriers to discharge        Co-evaluation               AM-PAC PT "6 Clicks" Mobility  Outcome Measure Help needed turning from your back to your side while in a flat bed without using bedrails?: A Little Help needed moving from lying on your back to sitting on the side of a flat bed without using bedrails?: A Little Help needed moving to and from a bed to a chair (including a wheelchair)?: A Little Help needed standing up from a chair using your arms (e.g., wheelchair or bedside chair)?: A Little Help needed to walk in hospital room?: A Little Help needed climbing 3-5 steps with a railing? : A Little 6 Click Score: 18    End of Session Equipment Utilized During Treatment: Gait belt Activity Tolerance: Patient tolerated treatment well Patient left: in chair;with call bell/phone within reach;with family/visitor present Nurse Communication: Mobility status PT Visit Diagnosis: Other abnormalities of gait and mobility (R26.89);Difficulty in walking, not elsewhere classified (R26.2);Pain Pain -  Right/Left: Right Pain - part of body: Hip    Time: 7356-7014 PT Time Calculation (min) (ACUTE ONLY): 28 min   Charges:   PT Evaluation $PT Eval Low Complexity: 1 Low PT Treatments $Gait Training: 8-22 mins        Lynesha Bango L. Tamala Julian, [place]  05/08/2021   Galen Manila 05/08/2021, 1:04 PM

## 2021-05-08 NOTE — Evaluation (Signed)
Occupational Therapy Evaluation Patient Details Name: Robert Irwin MRN: [id number] DOB: [birth year] Today's Date: 05/08/2021   History of Present Illness ARGIE APPLEGATE is a 80 year old male who was admitted to the hosptial with abdominal pain, nausea/vomitting and dehydration. of note patinet is s/p R THA AA due to arthritis and subchondral fracture from 10/6. PMH: afib, diabetes, HTN   Clinical Impression   Patient was noted to live at home with wife with independence in ADLs prior level. Currently, patient is min guard with functional mobility to bathroom with nurse present and  noted increased HR to 139-157 bpm during task with spikes noted up to 171 bpm. Patient was noted to require mod A for LB dressing/bathing tasks as well. Patient would continue to benefit from skilled OT services at this time while admitted and after d/c to address noted deficits in order to improve overall safety and independence in ADLs.        Recommendations for follow up therapy are one component of a multi-disciplinary discharge planning process, led by the attending physician.  Recommendations may be updated based on patient status, additional functional criteria and insurance authorization.   Follow Up Recommendations  Supervision/Assistance - 24 hour;Home health OT    Equipment Recommendations  Other (comment) (total hip kit)    Recommendations for Other Services       Precautions / Restrictions Precautions Precautions: Fall Precaution Comments: monitor HR Restrictions Weight Bearing Restrictions: No RLE Weight Bearing: Weight bearing as tolerated      Mobility Bed Mobility               General bed mobility comments: patient was up attempting to get to bathroom with daughter at start of session    Transfers Overall transfer level: Needs assistance Equipment used: Rolling walker (2 wheeled) Transfers: Sit to/from Stand Sit to Stand: Min guard              Balance Overall balance  assessment: Mild deficits observed, not formally tested                                         ADL either performed or assessed with clinical judgement   ADL Overall ADL's : Needs assistance/impaired Eating/Feeding: Set up;Sitting   Grooming: Set up;Sitting;Wash/dry face;Wash/dry hands   Upper Body Bathing: Sitting;Min guard   Lower Body Bathing: Sit to/from stand;Minimal assistance Lower Body Bathing Details (indicate cue type and reason): patient was noted to have HR increase to 139- 157 bpm with spiking noted to 172bpm  with movement. nursing aware Upper Body Dressing : Set up;Sitting   Lower Body Dressing: Sit to/from stand;Minimal assistance;Cueing for compensatory techniques;With adaptive equipment Lower Body Dressing Details (indicate cue type and reason): patient was able to participate in LB Dressing tasks with AE education. patient was able to demonstrate understanding for donning pants and socks. Toilet Transfer: Public affairs consultant and Hygiene: Min guard;Sit to/from stand Toileting - Clothing Manipulation Details (indicate cue type and reason): patient needed set up for hygiene materials as well.     Functional mobility during ADLs: Min guard;Rolling walker       Vision   Vision Assessment?: No apparent visual deficits     Perception     Praxis      Pertinent Vitals/Pain Pain Assessment: Faces Faces Pain Scale: Hurts little more Pain Location: R hip Pain  Descriptors / Indicators: Aching;Sore;Tightness Pain Intervention(s): Limited activity within patient's tolerance;Monitored during session     Hand Dominance Right   Extremity/Trunk Assessment Upper Extremity Assessment Upper Extremity Assessment: Overall WFL for tasks assessed   Lower Extremity Assessment Lower Extremity Assessment: Defer to PT evaluation   Cervical / Trunk Assessment Cervical / Trunk Assessment: Normal   Communication  Communication Communication: No difficulties   Cognition Arousal/Alertness: Awake/alert Behavior During Therapy: WFL for tasks assessed/performed Overall Cognitive Status: Within Functional Limits for tasks assessed                                     General Comments       Exercises     Shoulder Instructions      Home Living Family/patient expects to be discharged to:: Private residence Living Arrangements: Spouse/significant other;Children Available Help at Discharge: Family;Available 24 hours/day Type of Home: House Home Access: Stairs to enter [employer] of Steps: 3 Entrance Stairs-Rails: None Home Layout: Two level;Able to live on main level with bedroom/bathroom         Bathroom Toilet: Handicapped height     Home Equipment: None   Additional Comments: Pt able to stay on main level while recovering      Prior Functioning/Environment Level of Independence: Independent        Comments: Pt independent with community ambulation and self care tasks with increased pain        OT Problem List: Decreased knowledge of use of DME or AE;Decreased knowledge of precautions;Decreased activity tolerance;Impaired balance (sitting and/or standing);Decreased safety awareness;Cardiopulmonary status limiting activity      OT Treatment/Interventions: Self-care/ADL training;Therapeutic exercise;DME and/or AE instruction;Balance training;Patient/family education;Energy conservation;Therapeutic activities    OT Goals(Current goals can be found in the care plan section) Acute Rehab OT Goals Patient Stated Goal: return home OT Goal Formulation: With patient Time For Goal Achievement: 05/22/21 Potential to Achieve Goals: Good  OT Frequency: Min 1X/week   Barriers to D/C:            Co-evaluation              AM-PAC OT "6 Clicks" Daily Activity     Outcome Measure Help from another person eating meals?: None Help from another person  taking care of personal grooming?: A Little Help from another person toileting, which includes using toliet, bedpan, or urinal?: A Little Help from another person bathing (including washing, rinsing, drying)?: A Little Help from another person to put on and taking off regular upper body clothing?: None Help from another person to put on and taking off regular lower body clothing?: A Little 6 Click Score: 20   End of Session Equipment Utilized During Treatment: Gait belt;Rolling walker Nurse Communication: Other (comment) (cleared patient to participate in session. HR levels during session)  Activity Tolerance: Patient tolerated treatment well Patient left: in chair;with call bell/phone within reach;with family/visitor present  OT Visit Diagnosis: Unsteadiness on feet (R26.81);Muscle weakness (generalized) (M62.81)                Time: 4128-7867 OT Time Calculation (min): 35 min Charges:  OT General Charges $OT Visit: 1 Visit OT Evaluation $OT Eval Low Complexity: 1 Low OT Treatments $Self Care/Home Management : 8-22 mins  Jackelyn Poling OTR/L, MS Acute Rehabilitation Department Office# [phone] Pager# [phone]   [place] 05/08/2021, 10:39 AM

## 2021-05-09 DIAGNOSIS — R531 Weakness: Secondary | ICD-10-CM | POA: Diagnosis not present

## 2021-05-09 DIAGNOSIS — R112 Nausea with vomiting, unspecified: Secondary | ICD-10-CM | POA: Diagnosis not present

## 2021-05-09 DIAGNOSIS — E860 Dehydration: Secondary | ICD-10-CM | POA: Diagnosis not present

## 2021-05-09 DIAGNOSIS — Z79899 Other long term (current) drug therapy: Secondary | ICD-10-CM | POA: Diagnosis not present

## 2021-05-09 DIAGNOSIS — E785 Hyperlipidemia, unspecified: Secondary | ICD-10-CM | POA: Diagnosis not present

## 2021-05-09 DIAGNOSIS — K5900 Constipation, unspecified: Secondary | ICD-10-CM | POA: Diagnosis not present

## 2021-05-09 DIAGNOSIS — M1611 Unilateral primary osteoarthritis, right hip: Secondary | ICD-10-CM | POA: Diagnosis not present

## 2021-05-09 DIAGNOSIS — I4891 Unspecified atrial fibrillation: Secondary | ICD-10-CM | POA: Diagnosis not present

## 2021-05-09 DIAGNOSIS — Z96641 Presence of right artificial hip joint: Secondary | ICD-10-CM | POA: Diagnosis not present

## 2021-05-09 DIAGNOSIS — Z87891 Personal history of nicotine dependence: Secondary | ICD-10-CM | POA: Diagnosis not present

## 2021-05-09 DIAGNOSIS — Z20822 Contact with and (suspected) exposure to covid-19: Secondary | ICD-10-CM | POA: Diagnosis not present

## 2021-05-09 DIAGNOSIS — E119 Type 2 diabetes mellitus without complications: Secondary | ICD-10-CM | POA: Diagnosis not present

## 2021-05-09 LAB — CBC
HCT: 30.1 % — ABNORMAL LOW (ref 39.0–52.0)
Hemoglobin: 10.1 g/dL — ABNORMAL LOW (ref 13.0–17.0)
MCH: 32.3 pg (ref 26.0–34.0)
MCHC: 33.6 g/dL (ref 30.0–36.0)
MCV: 96.2 fL (ref 80.0–100.0)
Platelets: 236 10*3/uL (ref 150–400)
RBC: 3.13 MIL/uL — ABNORMAL LOW (ref 4.22–5.81)
RDW: 12.7 % (ref 11.5–15.5)
WBC: 6.8 10*3/uL (ref 4.0–10.5)
nRBC: 0 % (ref 0.0–0.2)

## 2021-05-09 LAB — MAGNESIUM: Magnesium: 1.8 mg/dL (ref 1.7–2.4)

## 2021-05-09 LAB — BASIC METABOLIC PANEL
Anion gap: 6 (ref 5–15)
BUN: 6 mg/dL — ABNORMAL LOW (ref 8–23)
CO2: 27 mmol/L (ref 22–32)
Calcium: 8 mg/dL — ABNORMAL LOW (ref 8.9–10.3)
Chloride: 104 mmol/L (ref 98–111)
Creatinine, Ser: 0.64 mg/dL (ref 0.61–1.24)
GFR, Estimated: >60 mL/min (ref >60)
Glucose, Bld: 148 mg/dL — ABNORMAL HIGH (ref 70–99)
Potassium: 3.5 mmol/L (ref 3.5–5.1)
Sodium: 137 mmol/L (ref 135–145)

## 2021-05-09 LAB — GLUCOSE, CAPILLARY
Glucose-Capillary: 143 mg/dL — ABNORMAL HIGH (ref 70–99)
Glucose-Capillary: 137 mg/dL — ABNORMAL HIGH (ref 70–99)
Glucose-Capillary: 177 mg/dL — ABNORMAL HIGH (ref 70–99)

## 2021-05-09 MED ORDER — PROCHLORPERAZINE MALEATE 10 MG PO TABS: 10.0000 mg | ORAL_TABLET | Freq: Four times a day (QID) | ORAL | 0 refills | Status: AC | PRN

## 2021-05-09 MED ORDER — POLYETHYLENE GLYCOL 3350 17 G PO PACK: 17.0000 g | PACK | Freq: Two times a day (BID) | ORAL | 0 refills | Status: AC

## 2021-05-09 MED ORDER — CALCIUM CARBONATE-VITAMIN D 500-200 MG-UNIT PO TABS
1.0000 | ORAL_TABLET | Freq: Every day | ORAL | Status: DC
  Administered 2021-05-09: 1 via ORAL
  Filled 2021-05-09: qty 1

## 2021-05-09 MED ORDER — METHOCARBAMOL 500 MG PO TABS: 500.0000 mg | ORAL_TABLET | Freq: Three times a day (TID) | ORAL | 0 refills | Status: DC | PRN

## 2021-05-09 MED ORDER — INSULIN ASPART 100 UNIT/ML IJ SOLN: 0.0000 [IU] | Freq: Three times a day (TID) | INTRAMUSCULAR | Status: DC

## 2021-05-09 MED ORDER — PANTOPRAZOLE SODIUM 40 MG PO TBEC: 40.0000 mg | DELAYED_RELEASE_TABLET | Freq: Every day | ORAL | 0 refills | Status: DC

## 2021-05-09 MED ORDER — TRAMADOL HCL 50 MG PO TABS: 50.0000 mg | ORAL_TABLET | Freq: Four times a day (QID) | ORAL | 0 refills | Status: AC | PRN

## 2021-05-09 MED ORDER — ACETAMINOPHEN 500 MG PO TABS: 500.0000 mg | ORAL_TABLET | Freq: Three times a day (TID) | ORAL | 0 refills | Status: AC

## 2021-05-09 MED ORDER — DILTIAZEM HCL ER COATED BEADS 240 MG PO CP24
240.0000 mg | ORAL_CAPSULE | Freq: Every day | ORAL | Status: DC
  Administered 2021-05-09: 240 mg via ORAL
  Filled 2021-05-09: qty 1

## 2021-05-09 MED ORDER — ACETAMINOPHEN 325 MG PO TABS: 650.0000 mg | ORAL_TABLET | Freq: Four times a day (QID) | ORAL | Status: AC | PRN

## 2021-05-09 MED ORDER — POTASSIUM CHLORIDE CRYS ER 20 MEQ PO TBCR
40.0000 meq | EXTENDED_RELEASE_TABLET | Freq: Once | ORAL | Status: AC
  Administered 2021-05-09: 40 meq via ORAL
  Filled 2021-05-09: qty 2

## 2021-05-09 MED ORDER — SENNOSIDES-DOCUSATE SODIUM 8.6-50 MG PO TABS: 1.0000 | ORAL_TABLET | Freq: Two times a day (BID) | ORAL | 0 refills | Status: AC

## 2021-05-09 MED ORDER — POLYETHYLENE GLYCOL 3350 17 G PO PACK: 17.0000 g | PACK | Freq: Every day | ORAL | Status: DC | PRN

## 2021-05-09 MED ORDER — MAGNESIUM SULFATE 2 GM/50ML IV SOLN
2.0000 g | Freq: Once | INTRAVENOUS | Status: AC
  Administered 2021-05-09: 2 g via INTRAVENOUS
  Filled 2021-05-09: qty 50

## 2021-05-09 MED ORDER — HYDROCORTISONE ACETATE 25 MG RE SUPP: 25.0000 mg | Freq: Two times a day (BID) | RECTAL | 1 refills | Status: AC

## 2021-05-09 NOTE — Discharge Summary (Signed)
Physician Discharge Summary  Robert Irwin QAS:[id number] DOB: [birth year] DOA: 05/07/2021  PCP: Wenda Low, MD  Admit date: 05/07/2021 Discharge date: 05/09/2021  Time spent: 55 minutes  Recommendations for Outpatient Follow-up:  Follow-up with Wenda Low, MD in 2 weeks.  On follow-up patient will need a basic metabolic profile, magnesium level done to follow-up on electrolytes and renal function. Follow-up with Dr. Lyla Glassing, orthopedics as previously scheduled on 05/14/2021.   Discharge Diagnoses:  Principal Problem:   Nausea and vomiting Active Problems:   Hyperlipidemia   Diabetes mellitus (HCC)   Atrial fibrillation with RVR (HCC)   Osteoarthritis of right hip   Weakness   Dehydration   Constipation   Discharge Condition: Stable and improved  Diet recommendation: Heart healthy  Filed Weights   05/07/21 1653 05/08/21 0506 05/09/21 0500  Weight: 64 kg 67.9 kg 69 kg    History of present illness:   Robert Irwin is a 80 y.o. male with past medical history of atrial fibrillation on chronic anticoagulation with Xarelto, well-controlled type 2 diabetes mellitus, hypertension, hyperlipidemia, osteoarthritis of the right hip status post total hip arthroplasty per Dr. Lyla Glassing 04/29/2021 presenting to the ED with a 5 to 7-day history of intractable nausea vomiting diffuse abdominal pain decreased oral intake.  Per patient and daughter patient underwent right hip arthroplasty on 04/29/2021, discharged on 04/30/2021 and subsequently discharged home.  Patient noted to have first bowel movement 4 days prior to admission which was small, hard with associated nausea vomiting diffuse abdominal pain and decreased oral intake.  Patient and family called orthopod's office and over-the-counter enemas were recommended which patient used on Tuesday and Wednesday however patient continued to have decreased appetite with ongoing nausea and unable to keep down any solid foods.  Patient tried liquids,  rice porridge 2 days prior to admission however continued to have ongoing emesis since then as well as the day prior to admission and the day of admission with inability to keep anything down including his medications.  Patient denies any fever, no chills, no chest pain, no shortness of breath, no diarrhea, no melena, no hematemesis, no hematochezia.  Patient denies any syncopal episodes.  Patient does endorse diffuse abdominal pain which she describes as sharp and intermittent with some associated constipation, generalized weakness, dizziness.  Patient subsequently presented to the ED.   Patient seen in the ED given IV fluids, IV antiemetics however had ongoing nausea and emesis recommended for admission.  CT abdomen and pelvis done was unremarkable.  Review of systems noted as above  Hospital Course:  Intractable nausea/vomiting/abdominal pain likely secondary to constipation -Likely secondary to constipation secondary to narcotic pain medications, as patient with recent hip surgery, on narcotic pain medications, noted to be constipated prior to admission with no significant improvement with over-the-counter enemas.         -Lipase levels within normal limits.         -CT abdomen and pelvis with no acute abdominal findings.          -Patient initially placed on clear liquids and placed on a bowel regiment.   -Patient tolerated clear liquids diet was advanced to full liquid diet and subsequently a soft diet which patient tolerated.   -Patient initially placed on scheduled IV Zofran, IV fluids, supportive care.  -Patient on presentation given a smog enema and subsequently placed on a bowel regimen of MiraLAX twice daily, Senokot-S twice daily and Dulcolax suppositories daily.  -Patient improved clinically, had good results on bowel  regimen, nausea and vomiting and abdominal pain resolved and scheduled IV Zofran was subsequently discontinued.  -Diet was advanced which patient tolerated.  -Narcotic pain  medication subsequently discontinued. -Patient hydrated with IV fluids.  -Patient improved clinically and will be discharged home in stable and improved condition with close outpatient follow-up with PCP.   2.  A. fib -Patient remained in normal sinus rhythm during the hospitalization. -Due to nausea and vomiting and inability to keep anything down oral calcium channel blocker held and patient was placed on IV Lopressor every 8 hours for rate control.   -Patient improved clinically, had no further nausea or vomiting and oral intake improved such that by day of discharge patient was subsequently transition back to home regimen of calcium channel blocker.   -Patient maintained on home regimen Xarelto for anticoagulation.   -Outpatient follow-up with cardiology as previously scheduled.    3.  Dehydration -Hydrated with IV fluids. -Was euvolemic by day of discharge  4.  Constipation -Likely secondary to narcotic pain medications in the setting of recent surgery. -Status post smog enema with some bowel movement.  -Patient subsequently placed on MiraLAX twice daily, Senokot-S twice daily as well as given Dulcolax suppository.  -Narcotics were discontinued.  -Patient had good results on bowel regimen and will be discharged home on MiraLAX twice daily, Senokot-S twice daily x7 days.  -Outpatient follow-up with PCP.    5.  Generalized weakness -Likely secondary to problem #1 with decreased oral intake and ongoing nausea and vomiting.  Patient also with recent right hip surgery. -Patient seen by PT/OT.  6.  Status post right hip arthroplasty 04/29/2021 -Pain management, PT/OT. -Patient maintained on home regimen of Xarelto for anticoagulation. -Patient seen in consultation by orthopedics Dr. Lyla Glassing and recommending WBAT with walker.   -Narcotics discontinued per orthopedic recommendations. -Ultram as needed. -Patient placed on scheduled Tylenol 500 mg 3 times daily which will be discharged on  for 1 more week.   -Outpatient follow-up with orthopedics as previously scheduled.  7.  Well-controlled diabetes mellitus type 2 -Hemoglobin A1c 6.3 (04/23/2021). -Blood glucose level noted to be 84 on labs the morning of 05/08/2021 however remained asymptomatic.   -Patient's oral hypoglycemic agents were held during the hospitalization and patient maintained on SSI.   -Outpatient follow-up with PCP.    8.  Hemorrhoids -Anusol suppository twice daily was ordered which patient will be discharged home on. -Outpatient follow-up with PCP.Marland Kitchen    Procedures: CT abdomen and pelvis 05/07/2021  Consultations: Orthopedics: Dr. Lyla Glassing 05/08/2021    Discharge Exam: Vitals:   05/09/21 0500 05/09/21 0523  BP:  122/80  Pulse:  84  Resp: (!) 26 20  Temp:  98.4 F (36.9 C)  SpO2:  98%    General: NAD Cardiovascular: RRR no murmurs rubs or gallops.  No JVD.  No lower extremity edema. Respiratory: CTA B.  No wheezes, no crackles, no rhonchi.  Normal respiratory effort.  Discharge Instructions   Discharge Instructions     Diet - low sodium heart healthy   Complete by: As directed    Discharge wound care:   Complete by: As directed    As per orthopedics from recent hospitalization   Increase activity slowly   Complete by: As directed       Allergies as of 05/09/2021   No Known Allergies      Medication List     STOP taking these medications    docusate sodium 100 MG capsule Commonly known as: COLACE  HYDROcodone-acetaminophen 5-325 MG tablet Commonly known as: NORCO/VICODIN   ondansetron 4 MG tablet Commonly known as: ZOFRAN   senna 8.6 MG Tabs tablet Commonly known as: SENOKOT       TAKE these medications    acetaminophen 500 MG tablet Commonly known as: TYLENOL Take 1 tablet (500 mg total) by mouth 3 (three) times daily for 7 days. What changed:  medication strength how much to take when to take this reasons to take this   acetaminophen 325 MG  tablet Commonly known as: TYLENOL Take 2 tablets (650 mg total) by mouth every 6 (six) hours as needed for mild pain (or Fever >/= 101). Start taking on: May 17, 2021 What changed: You were already taking a medication with the same name, and this prescription was added. Make sure you understand how and when to take each.   atorvastatin 20 MG tablet Commonly known as: LIPITOR TAKE 1 TABLET EVERY DAY (KEEP UPCOMING MD APPOINTMENT FOR REFILLS) What changed: See the new instructions.   calcium citrate-vitamin D 200-200 MG-UNIT Tabs Take 1 tablet by mouth daily.   colchicine 0.6 MG tablet Take 1 tablet (0.6 mg total) by mouth 2 (two) times daily for 6 days. What changed:  when to take this reasons to take this   glimepiride 2 MG tablet Commonly known as: AMARYL Take 2 mg by mouth daily before breakfast.   hydrocortisone 25 MG suppository Commonly known as: ANUSOL-HC Place 1 suppository (25 mg total) rectally 2 (two) times daily for 10 days.   multivitamin with minerals Tabs tablet Take 1 tablet by mouth daily.   OSTEO BI-FLEX REGULAR STRENGTH PO Take 1 tablet by mouth 2 (two) times daily.   pantoprazole 40 MG tablet Commonly known as: Protonix Take 1 tablet (40 mg total) by mouth daily for 14 days.   polyethylene glycol 17 g packet Commonly known as: MIRALAX / GLYCOLAX Take 17 g by mouth 2 (two) times daily for 7 days. Hold if develops diarrhea   prochlorperazine 10 MG tablet Commonly known as: COMPAZINE Take 1 tablet (10 mg total) by mouth every 6 (six) hours as needed for nausea or vomiting.   rivaroxaban 20 MG Tabs tablet Commonly known as: Xarelto TAKE 1 TABLET EVERY DAY WITH SUPPER.  Pt Overdue for follow-up, MUST see MD for FUTURE refills. What changed:  how much to take how to take this when to take this additional instructions   senna-docusate 8.6-50 MG tablet Commonly known as: Senokot-S Take 1 tablet by mouth 2 (two) times daily for 7 days.    Tiadylt ER 240 MG 24 hr capsule Generic drug: diltiazem TAKE 1 CAPSULE EVERY DAY (PLEASE KEEP UPCOMMING APPOINTMENT IN JULY 2022 FOR FUTURE REFILLS. [place]) What changed: See the new instructions.   traMADol 50 MG tablet Commonly known as: ULTRAM Take 1 tablet (50 mg total) by mouth every 6 (six) hours as needed for moderate pain.               Discharge Care Instructions  (From admission, onward)           Start     Ordered   05/09/21 0000  Discharge wound care:       Comments: As per orthopedics from recent hospitalization   05/09/21 1321           No Known Allergies  Follow-up Information     Wenda Low, MD. Schedule an appointment as soon as possible for a visit in 2 week(s).  Specialty: Internal Medicine Contact information: [address], [address] [place] [address] [phone]         Jerline Pain, MD .   Specialty: Cardiology Contact information: [phone] [address] Suite [place] [address] [phone]         Rod Can, MD Follow up on 05/14/2021.   Specialty: Orthopedic Surgery Why: Follow-up as scheduled Contact information: [address] [place] [address] [phone]                  The results of significant diagnostics from this hospitalization (including imaging, microbiology, ancillary and laboratory) are listed below for reference.    Significant Diagnostic Studies: CT Abdomen Pelvis W Contrast  Result Date: 05/07/2021 CLINICAL DATA:  Abdominal distension EXAM: CT ABDOMEN AND PELVIS WITH CONTRAST TECHNIQUE: Multidetector CT imaging of the abdomen and pelvis was performed using the standard protocol following bolus administration of intravenous contrast. CONTRAST:  140mL OMNIPAQUE IOHEXOL 300 MG/ML  SOLN COMPARISON:  None. FINDINGS: Lower chest: No acute abnormality. Hepatobiliary: No suspicious focal liver lesion. Gallbladder is mildly distended contains  gallstones with no evidence of gallbladder wall thickening. No biliary ductal dilation. Pancreas: Unremarkable. No pancreatic ductal dilatation or surrounding inflammatory changes. Spleen: Normal in size without focal abnormality. Adrenals/Urinary Tract: Adrenal glands are unremarkable. Kidneys are normal, without renal calculi, focal lesion, or hydronephrosis. Bladder is unremarkable. Stomach/Bowel: Stomach is within normal limits. Appendix is not visualized. No evidence of bowel wall thickening, distention, or inflammatory changes. Vascular/Lymphatic: Aortic atherosclerosis. No enlarged abdominal or pelvic lymph nodes. Reproductive: Calcifications of the prostate. Other: No abdominal wall hernia or abnormality. No abdominopelvic ascites. Musculoskeletal: Postsurgical changes of right total hip arthroplasty including small locules of air in the surrounding soft tissues and overlying skin closure staples. Moderate degenerative disc disease of the lumbar spine. Otherwise, no acute osseous abnormality. IMPRESSION: No acute CT findings in the abdomen or pelvis. Postsurgical changes of right hip replacement. Aortic Atherosclerosis (ICD10-I70.0). Electronically Signed   By: Yetta Glassman M.D.   On: 05/07/2021 11:43   DG Pelvis Portable  Result Date: 04/29/2021 CLINICAL DATA:  Postop EXAM: PORTABLE PELVIS 1-2 VIEWS COMPARISON:  None. FINDINGS: Right hip replacement with intact hardware and normal alignment. Gas in the soft tissues consistent with recent surgery IMPRESSION: Right hip replacement with expected postsurgical change Electronically Signed   By: Donavan Foil M.D.   On: 04/29/2021 16:12   DG Foot Complete Right  Result Date: 04/29/2021 CLINICAL DATA:  Right foot swelling without known injury. EXAM: RIGHT FOOT COMPLETE - 3+ VIEW COMPARISON:  None. FINDINGS: There is no evidence of fracture or dislocation. There is no evidence of arthropathy or other focal bone abnormality. Soft tissues are  unremarkable. IMPRESSION: Negative. Electronically Signed   By: Marijo Conception M.D.   On: 04/29/2021 11:16   DG C-Arm 1-60 Min-No Report  Result Date: 04/29/2021 Fluoroscopy was utilized by the requesting physician.  No radiographic interpretation.   DG HIP OPERATIVE UNILAT W OR W/O PELVIS RIGHT  Result Date: 04/29/2021 CLINICAL DATA:  Hip surgery EXAM: OPERATIVE right HIP (WITH PELVIS IF PERFORMED) 4 VIEWS TECHNIQUE: Fluoroscopic spot image(s) were submitted for interpretation post-operatively. COMPARISON:  None. FINDINGS: Four low resolution intraoperative spot views of the right hip were obtained. Total fluoroscopy time was 17 seconds. The images demonstrate a right hip replacement with normal alignment IMPRESSION: Intraoperative fluoroscopic assistance provided during right hip replacement surgery Electronically Signed   By: Madie Reno.D.  On: 04/29/2021 16:13    Microbiology: Recent Results (from the past 240 hour(s))  Resp Panel by RT-PCR (Flu A&B, Covid) Nasopharyngeal Swab     Status: None   Collection Time: 05/07/21 12:57 PM   Specimen: Nasopharyngeal Swab; Nasopharyngeal(NP) swabs in vial transport medium  Result Value Ref Range Status   SARS Coronavirus 2 by RT PCR NEGATIVE NEGATIVE Final    Comment: (NOTE) SARS-CoV-2 target nucleic acids are NOT DETECTED.  The SARS-CoV-2 RNA is generally detectable in upper respiratory specimens during the acute phase of infection. The lowest concentration of SARS-CoV-2 viral copies this assay can detect is 138 copies/mL. A negative result does not preclude SARS-Cov-2 infection and should not be used as the sole basis for treatment or other patient management decisions. A negative result may occur with  improper specimen collection/handling, submission of specimen other than nasopharyngeal swab, presence of viral mutation(s) within the areas targeted by this assay, and inadequate number of viral copies(<138 copies/mL). A negative  result must be combined with clinical observations, patient history, and epidemiological information. The expected result is Negative.  Fact Sheet for Patients:  EntrepreneurPulse.com.au  Fact Sheet for Healthcare Providers:  IncredibleEmployment.be  This test is no t yet approved or cleared by the [place] FDA and  has been authorized for detection and/or diagnosis of SARS-CoV-2 by FDA under an Emergency Use Authorization (EUA). This EUA will remain  in effect (meaning this test can be used) for the duration of the COVID-19 declaration under Section 564(b)(1) of the Act, 21 U.S.C.section 360bbb-3(b)(1), unless the authorization is terminated  or revoked sooner.       Influenza A by PCR NEGATIVE NEGATIVE Final   Influenza B by PCR NEGATIVE NEGATIVE Final    Comment: (NOTE) The Xpert Xpress SARS-CoV-2/FLU/RSV plus assay is intended as an aid in the diagnosis of influenza from Nasopharyngeal swab specimens and should not be used as a sole basis for treatment. Nasal washings and aspirates are unacceptable for Xpert Xpress SARS-CoV-2/FLU/RSV testing.  Fact Sheet for Patients: EntrepreneurPulse.com.au  Fact Sheet for Healthcare Providers: IncredibleEmployment.be  This test is not yet approved or cleared by the [place] FDA and has been authorized for detection and/or diagnosis of SARS-CoV-2 by FDA under an Emergency Use Authorization (EUA). This EUA will remain in effect (meaning this test can be used) for the duration of the COVID-19 declaration under Section 564(b)(1) of the Act, 21 U.S.C. section 360bbb-3(b)(1), unless the authorization is terminated or revoked.  Performed at [facility], [address], [place], [place] [address]      Labs: Basic Metabolic Panel: Recent Labs  Lab 05/07/21 0854 05/08/21 0518 05/09/21 0437  NA 136 136 137  K 3.8 3.6 3.5  CL 98 101  104  CO2 27 24 27   GLUCOSE 131* 84 148*  BUN 12 10 6*  CREATININE 0.80 0.64 0.64  CALCIUM 9.1 8.4* 8.0*  MG  --  2.0 1.8  PHOS  --  3.4  --    Liver Function Tests: Recent Labs  Lab 05/07/21 0854 05/08/21 0518  AST 29 26  ALT 31 28  ALKPHOS 74 60  BILITOT 1.3* 1.5*  PROT 6.6 5.6*  ALBUMIN 3.8 3.0*   Recent Labs  Lab 05/07/21 0854  LIPASE 26   No results for input(s): AMMONIA in the last 168 hours. CBC: Recent Labs  Lab 05/07/21 0854 05/08/21 0518 05/09/21 0437  WBC 9.0 8.4 6.8  NEUTROABS 6.9  --   --   HGB  12.0* 10.8* 10.1*  HCT 35.5* 31.7* 30.1*  MCV 93.7 94.6 96.2  PLT 314 261 236   Cardiac Enzymes: No results for input(s): CKTOTAL, CKMB, CKMBINDEX, TROPONINI in the last 168 hours. BNP: BNP (last 3 results) No results for input(s): BNP in the last 8760 hours.  ProBNP (last 3 results) No results for input(s): PROBNP in the last 8760 hours.  CBG: Recent Labs  Lab 05/08/21 1822 05/08/21 2356 05/09/21 0525 05/09/21 0804 05/09/21 1149  GLUCAP 169* 126* 143* 137* 177*       Signed:  Irine Seal MD.  [facility] 05/09/2021, 1:33 PM

## 2021-05-09 NOTE — TOC Transition Note (Signed)
Transition of Care [facility]) - CM/SW Discharge Note   Patient Details  Name: Robert Irwin MRN: [id number] Date of Birth: [birth year]  [facility] CM/SW Contact:  Ross Ludwig, LCSW Phone Number: 05/09/2021, 12:30 PM   Clinical Narrative:     CSW was informed that PT is recommending home health PT.  CSW spoke to patient and his daughter, they do not feel like he needs any home health services.  Per patient's daughter Tye [place], they feel he is doing his exercises well at home already.  CSW informed her if they change their mind to contact his PCP who can order home health services.  CSW asked if they needed any other equipment and per daughter they already have it at home.  CSW signing off, no other TOC needs, please reconsult if needs arise.   Final next level of care: Home/Self Care Barriers to Discharge: Barriers Resolved   Patient Goals and CMS Choice Patient states their goals for this hospitalization and ongoing recovery are:: To return back home CMS Medicare.gov Compare Post Acute Care list provided to:: Patient Represenative (must comment) Choice offered to / list presented to : Adult Children  Discharge Placement                       Discharge Plan and Services                                     Social Determinants of Health (SDOH) Interventions     Readmission Risk Interventions No flowsheet data found.

## 2021-05-09 NOTE — Progress Notes (Signed)
Physical Therapy Treatment Patient Details Name: Robert Irwin MRN: [id number] DOB: 08/18/1940 Today's Date: 05/09/2021   History of Present Illness Robert Irwin is a 80 year old male who was admitted to the hosptial with abdominal pain, nausea/vomitting and dehydration. of note patinet is s/p R THA AA due to arthritis and subchondral fracture from 10/6. PMH: afib, diabetes, HTN    PT Comments    Pt in good spirits and very motivated.  Pt up to ambulate increased distance in hall, performed full HEP, reviewed technique to move to side-ly for sleep.  Pt and dtr with many questions asked and answered.     Recommendations for follow up therapy are one component of a multi-disciplinary discharge planning process, led by the attending physician.  Recommendations may be updated based on patient status, additional functional criteria and insurance authorization.  Follow Up Recommendations  Follow surgeon's recommendation for DC plan and follow-up therapies;Home health PT     Equipment Recommendations  None recommended by PT    Recommendations for Other Services       Precautions / Restrictions Precautions Precautions: Fall Precaution Comments: monitor HR Restrictions Weight Bearing Restrictions: No RLE Weight Bearing: Weight bearing as tolerated     Mobility  Bed Mobility               General bed mobility comments: Pt up in chair and requests back to same    Transfers Overall transfer level: Needs assistance Equipment used: Rolling walker (2 wheeled) Transfers: Sit to/from Stand Sit to Stand: Min guard         General transfer comment: min cues for safe transition position  Ambulation/Gait Ambulation/Gait assistance: Min guard;Supervision Gait Distance (Feet): 240 Feet Assistive device: Rolling walker (2 wheeled) Gait Pattern/deviations: Step-through pattern;Decreased stance time - right Gait velocity: decreased   General Gait Details: cues for posture and position  from [employer]             Wheelchair Mobility    Modified Rankin (Stroke Patients Only)       Balance Overall balance assessment: Mild deficits observed, not formally tested Sitting-balance support: Feet supported Sitting balance-Leahy Scale: Good Sitting balance - Comments: seated EOB   Standing balance support: During functional activity;Bilateral upper extremity supported Standing balance-Leahy Scale: Fair Standing balance comment: static without UE support, dynamic with UE support                            Cognition Arousal/Alertness: Awake/alert Behavior During Therapy: WFL for tasks assessed/performed Overall Cognitive Status: Within Functional Limits for tasks assessed                                        Exercises Total Joint Exercises Ankle Circles/Pumps: Seated;AROM;Strengthening;Both;20 reps Quad Sets: Seated;AROM;Strengthening;Right;10 reps Heel Slides: AAROM;Right;20 reps;Supine Hip ABduction/ADduction: AAROM;Right;15 reps;Supine;AROM;10 reps;Standing Long Arc Quad: AAROM;AROM;Right;10 reps;Supine Knee Flexion: AROM;Right;10 reps;Standing Marching in Standing: AROM;Right;10 reps;Standing Standing Hip Extension: AROM;Right;10 reps;Standing    General Comments        Pertinent Vitals/Pain Pain Assessment: 0-10 Pain Score: 5  Pain Location: R hip Pain Descriptors / Indicators: Sore Pain Intervention(s): Limited activity within patient's tolerance;Monitored during session;Premedicated before session;Ice applied    Home Living                      Prior  Function            PT Goals (current goals can now be found in the care plan section) Acute Rehab PT Goals Patient Stated Goal: Regain IND PT Goal Formulation: With patient/family Time For Goal Achievement: 05/22/21 Potential to Achieve Goals: Good Progress towards PT goals: Progressing toward goals    Frequency    7X/week      PT Plan  Current plan remains appropriate    Co-evaluation              AM-PAC PT "6 Clicks" Mobility   Outcome Measure  Help needed turning from your back to your side while in a flat bed without using bedrails?: A Little Help needed moving from lying on your back to sitting on the side of a flat bed without using bedrails?: A Little Help needed moving to and from a bed to a chair (including a wheelchair)?: A Little Help needed standing up from a chair using your arms (e.g., wheelchair or bedside chair)?: A Little Help needed to walk in hospital room?: A Little Help needed climbing 3-5 steps with a railing? : A Little 6 Click Score: 18    End of Session Equipment Utilized During Treatment: Gait belt Activity Tolerance: Patient tolerated treatment well Patient left: in chair;with call bell/phone within reach;with family/visitor present Nurse Communication: Mobility status PT Visit Diagnosis: Other abnormalities of gait and mobility (R26.89);Difficulty in walking, not elsewhere classified (R26.2);Pain Pain - Right/Left: Right Pain - part of body: Hip     Time: 1610-9604 PT Time Calculation (min) (ACUTE ONLY): 47 min  Charges:  $Gait Training: 8-22 mins $Therapeutic Exercise: 8-22 mins $Therapeutic Activity: 8-22 mins                     Debe Coder PT Acute Rehabilitation Services Pager [phone] Office [phone]    Jesilyn Easom 05/09/2021, 1:48 PM

## 2021-05-12 ENCOUNTER — Other Ambulatory Visit: Payer: Self-pay | Admitting: Cardiology

## 2021-05-14 DIAGNOSIS — Z96641 Presence of right artificial hip joint: Secondary | ICD-10-CM | POA: Diagnosis not present

## 2021-05-14 DIAGNOSIS — Z471 Aftercare following joint replacement surgery: Secondary | ICD-10-CM | POA: Diagnosis not present

## 2021-05-17 DIAGNOSIS — R112 Nausea with vomiting, unspecified: Secondary | ICD-10-CM | POA: Diagnosis not present

## 2021-05-17 DIAGNOSIS — K5900 Constipation, unspecified: Secondary | ICD-10-CM | POA: Diagnosis not present

## 2021-05-17 DIAGNOSIS — Z96649 Presence of unspecified artificial hip joint: Secondary | ICD-10-CM | POA: Diagnosis not present

## 2021-06-11 DIAGNOSIS — Z96641 Presence of right artificial hip joint: Secondary | ICD-10-CM | POA: Diagnosis not present

## 2021-06-11 DIAGNOSIS — Z471 Aftercare following joint replacement surgery: Secondary | ICD-10-CM | POA: Diagnosis not present

## 2021-07-14 DIAGNOSIS — Z20822 Contact with and (suspected) exposure to covid-19: Secondary | ICD-10-CM | POA: Diagnosis not present

## 2021-07-14 DIAGNOSIS — U071 COVID-19: Secondary | ICD-10-CM | POA: Diagnosis not present

## 2021-07-14 DIAGNOSIS — J069 Acute upper respiratory infection, unspecified: Secondary | ICD-10-CM | POA: Diagnosis not present

## 2021-07-14 DIAGNOSIS — R051 Acute cough: Secondary | ICD-10-CM | POA: Diagnosis not present

## 2021-07-22 DIAGNOSIS — R059 Cough, unspecified: Secondary | ICD-10-CM | POA: Diagnosis not present

## 2021-07-22 DIAGNOSIS — R058 Other specified cough: Secondary | ICD-10-CM | POA: Diagnosis not present

## 2021-07-30 DIAGNOSIS — R052 Subacute cough: Secondary | ICD-10-CM | POA: Diagnosis not present

## 2021-07-30 DIAGNOSIS — J309 Allergic rhinitis, unspecified: Secondary | ICD-10-CM | POA: Diagnosis not present

## 2021-08-03 ENCOUNTER — Other Ambulatory Visit: Payer: Self-pay

## 2021-08-03 ENCOUNTER — Ambulatory Visit (HOSPITAL_BASED_OUTPATIENT_CLINIC_OR_DEPARTMENT_OTHER): Payer: Medicare HMO | Attending: Orthopedic Surgery | Admitting: Physical Therapy

## 2021-08-03 ENCOUNTER — Encounter (HOSPITAL_BASED_OUTPATIENT_CLINIC_OR_DEPARTMENT_OTHER): Payer: Self-pay | Admitting: Physical Therapy

## 2021-08-03 DIAGNOSIS — M25651 Stiffness of right hip, not elsewhere classified: Secondary | ICD-10-CM | POA: Diagnosis not present

## 2021-08-03 DIAGNOSIS — R252 Cramp and spasm: Secondary | ICD-10-CM | POA: Diagnosis not present

## 2021-08-03 DIAGNOSIS — R2689 Other abnormalities of gait and mobility: Secondary | ICD-10-CM | POA: Insufficient documentation

## 2021-08-03 NOTE — Therapy (Addendum)
OUTPATIENT PHYSICAL THERAPY LOWER EXTREMITY EVALUATION   Patient Name: Robert Irwin  MRN: [id number] DOB:[birth year], 81 y.o., male Today's Date: 08/03/2021    Past Medical History:  Diagnosis Date   A-fib [facility])    Arthritis    Atrial fibrillation ([place])    Diabetes mellitus    Dysrhythmia    ED (erectile dysfunction)    Epistaxis    Hyperlipidemia    Hypertension    Past Surgical History:  Procedure Laterality Date   APPENDECTOMY     NASAL MASS EXCISION     NASAL SINUS SURGERY  12/16/2011   Procedure: ENDOSCOPIC SINUS SURGERY;  Surgeon: Rozetta Nunnery, MD;  Location: [place];  Service: ENT;  Laterality: N/A;  for control of epistaxis   TOTAL HIP ARTHROPLASTY Right 04/29/2021   Procedure: TOTAL HIP ARTHROPLASTY ANTERIOR APPROACH;  Surgeon: Rod Can, MD;  Location: [facility];  Service: Orthopedics;  Laterality: Right;   Patient Active Problem List   Diagnosis Date Noted   Nausea and vomiting 05/07/2021   Weakness 05/07/2021   Dehydration 05/07/2021   Constipation 05/07/2021   Osteoarthritis of right hip 04/29/2021   Encounter for therapeutic drug monitoring 08/22/2013   Atrial fibrillation ([place]) 05/10/2013   Anemia 12/16/2011   Epistaxis 12/14/2011   Hyperlipidemia 12/14/2011   Diabetes mellitus ([place]) 12/14/2011   Diabetes mellitus with coincident hypertension ([place]) 12/14/2011   Atrial fibrillation with RVR ([place]) 12/14/2011    PCP: Wenda Low, MD  REFERRING PROVIDER: Rod Can, MD  REFERRING DIAG: [phone] (ICD-10-CM) - Presence of right artificial hip joint  THERAPY DIAG:  B14.782 (ICD-10-CM) - Presence of right artificial hip joint  ONSET DATE: 04/29/2021  SUBJECTIVE:   SUBJECTIVE STATEMENT: Patient had a total hip replacement on 04/29/2021. He reports he has been walking at home mostly and doing hip abduction exercises. He still feels like he is stiff. He was functioning at a high level prior to surgery. All he has been doing at home it walking  and hip abduction.   PERTINENT HISTORY: DMII, Arhtritis in the hand; Afib   PAIN:   No pain just stiffness   PRECAUTIONS: None  WEIGHT BEARING RESTRICTIONS No  FALLS:  Has patient fallen in last 6 months? No, Number of falls: None   LIVING ENVIRONMENT: Has steps in the house> reports he is able to go up them without much difficulty   OCCUPATION:  Retired  Hobbies:  Golf and likes sports   PLOF: [place] to return to golf and an active lifestyle.    OBJECTIVE:   DIAGNOSTIC FINDINGS: nothing post-op    COGNITION:  Overall cognitive status: Within functional limits for tasks assessed     SENSATION:  Light touch: Appears intact  Stereognosis: Appears intact  Hot/Cold: Appears intact  Proprioception: Appears intact   POSTURE:  Good posture   PALPATION: Significant tightness in patients anterior hip; mild TTP in the patients lateral hip.   LE AROM/PROM:  PROM Right 08/03/2021 Left 08/03/2021  Hip flexion 110 degrees    Hip extension    Hip abduction    Hip adduction    Hip internal rotation No significant limitation    Hip external rotation No significant limitation    Knee flexion    Knee extension    Ankle dorsiflexion    Ankle plantarflexion    Ankle inversion    Ankle eversion     (Blank rows = not tested)  LE MMT:  MMT Right 08/03/2021 Left 08/03/2021  Hip flexion  14.9 22.9  Hip extension    Hip abduction 31.2 33.3  Hip adduction    Hip internal rotation    Hip external rotation    Knee flexion    Knee extension 39.8 50.3  Ankle dorsiflexion    Ankle plantarflexion    Ankle inversion    Ankle eversion     (Blank rows = not tested)   GAIT: Adjusted patients cane 1 nothch higher. He had a lateral lean into the cane. He is more upright walking withotu the cane. With and without the cane decreased stride length and hip flexion.    TODAY'S TREATMENT: ' Manual therapy: trigger point release and roller to the  anterior hip   Program Notes Roller to anterior hip     Exercises Supine March - 2 x daily - 7 x weekly - 3 sets - 15 reps Supine Bridge - 2 x daily - 7 x weekly - 3 sets - 10 reps Seated Long Arc Quad - 2 x daily - 7 x weekly - 3 sets - 10 reps   PATIENT EDUCATION:  Education details: self soft tissue mobilization; HEP, symptom mangement  Person educated: Patient Education method: Explanation, Demonstration, Tactile cues, Verbal cues, and Handouts Education comprehension: verbalized understanding, returned demonstration, verbal cues required, tactile cues required, and needs further education   HOME EXERCISE PROGRAM: Access Code: [email or web address] URL: [email or web address] Date: 08/04/2021 Prepared by: Carolyne Littles  Program Notes Roller to anterior hip     Exercises Supine March - 2 x daily - 7 x weekly - 3 sets - 15 reps Supine Bridge - 2 x daily - 7 x weekly - 3 sets - 10 reps Seated Long Arc Quad - 2 x daily - 7 x weekly - 3 sets - 10 reps   ASSESSMENT:  CLINICAL IMPRESSION: Patient is a 81 year old male S/P right anterior approach THA on 04/29/2021. Prior to the surgery he was not using a cane. He reports continued stiffness and instability with ambulation. He feels like it should be better now. He has limited strength with all movements except abduction. He reports he has been working his abduction daily at home. He has mild limitations in his motion. Prior to surgery he was an avid golfer and liked to walk. He would benefit from skilled therapy to return to prior function.    Objective impairments include Abnormal gait, decreased activity tolerance, decreased endurance, decreased mobility, difficulty walking, decreased ROM, decreased strength, increased fascial restrictions, and pain. These impairments are limiting patient from golf shopping, community ambulation . Personal factors including 1 comorbidity: a-fib   are also affecting patient's functional  outcome. Patient will benefit from skilled PT to address above impairments and improve overall function.  REHAB POTENTIAL: Excellent  CLINICAL DECISION MAKING: Stable/uncomplicated  EVALUATION COMPLEXITY: Low   GOALS: Goals reviewed with patient? Yes  SHORT TERM GOALS:  STG Name Target Date Goal status  1 Patient will report a 50% reduction in pain with activity  Baseline:  08/25/2021 INITIAL  2 Patient will demonstrate full hip ROM without tight feeling at end range  Baseline:  08/25/2021 INITIAL  3 Patient will ambulate 500' without a cane  Baseline: 08/25/2021 INITIAL   LONG TERM GOALS:   LTG Name Target Date Goal status  1 Patient will ambulate in the community without an assistive device  Baseline: 09/15/2021 INITIAL  2 Patient will return to golf If cleared by MD  Baseline: 09/15/2021 INITIAL  3 Patient will go up/down 12 steps  without difficulty  Baseline: 09/15/2021 INITIAL  PLAN: PT FREQUENCY: 2x/week  PT DURATION: 6 weeks  PLANNED INTERVENTIONS: Therapeutic exercises, Therapeutic activity, Neuro Muscular re-education, Balance training, Gait training, Patient/Family education, Joint mobilization, Stair training, DME instructions, Aquatic Therapy, Dry Needling, Electrical stimulation, Cryotherapy, Moist heat, Taping, Ultrasound, and Manual therapy  PLAN FOR NEXT SESSION:continue with manual therapy to the hip. Add standing series of exercises; consider step ups;  Referring diagnosis? N56.213 (ICD-10-CM) - Presence of right artificial hip joint Treatment diagnosis? (if different than referring diagnosis) Z96.641 (ICD-10-CM) - Presence of right artificial hip joint What was this (referring dx) caused by? [x]  Surgery []  Fall []  Ongoing issue []  Arthritis []  Other: ____________  Laterality: [x]  Rt []  Lt []  Both  Check all possible CPT codes:  *CHOOSE 10 OR LESS*    []  97110 (Therapeutic Exercise)  []  92507 (SLP Treatment)  []  97112 (Neuro Re-ed)   []  92526  (Swallowing Treatment)   []  97116 (Gait Training)   []  [id number] (Cognitive Training, 1st 15 minutes) []  97140 (Manual Therapy)   []  97130 (Cognitive Training, each add'l 15 minutes)  []  97530 (Therapeutic Activities)  []  Other, List CPT Code ____________    []  08657 (Self Care)       [x]  All codes above (97110 - 97535)  []  97012 (Mechanical Traction)  [x]  97014 (E-stim Unattended)  []  97032 (E-stim manual)  []  97033 (Ionto)  [x]  97035 (Ultrasound)  []  97760 (Orthotic Fit) []  97750 (Physical Performance Training) []  [id number] (Aquatic Therapy) []  97034 (Contrast Bath) []  [id number] (Paraffin) []  97597 (Wound Care 1st 20 sq cm) []  97598 (Wound Care each add'l 20 sq cm) []  97016 (Vasopneumatic Device) []  [id number] (Orthotic Training) []  [id number] (Prosthetic Training)   Carney Living PT DPT  08/03/2021, 10:22 AM

## 2021-08-04 ENCOUNTER — Encounter (HOSPITAL_BASED_OUTPATIENT_CLINIC_OR_DEPARTMENT_OTHER): Payer: Self-pay | Admitting: Physical Therapy

## 2021-08-10 ENCOUNTER — Ambulatory Visit (HOSPITAL_BASED_OUTPATIENT_CLINIC_OR_DEPARTMENT_OTHER): Payer: Medicare HMO | Admitting: Physical Therapy

## 2021-08-16 ENCOUNTER — Ambulatory Visit (HOSPITAL_BASED_OUTPATIENT_CLINIC_OR_DEPARTMENT_OTHER): Payer: Medicare HMO | Admitting: Physical Therapy

## 2021-08-16 ENCOUNTER — Encounter (HOSPITAL_BASED_OUTPATIENT_CLINIC_OR_DEPARTMENT_OTHER): Payer: Self-pay | Admitting: Physical Therapy

## 2021-08-16 ENCOUNTER — Other Ambulatory Visit: Payer: Self-pay

## 2021-08-16 DIAGNOSIS — R2689 Other abnormalities of gait and mobility: Secondary | ICD-10-CM | POA: Diagnosis not present

## 2021-08-16 DIAGNOSIS — M25651 Stiffness of right hip, not elsewhere classified: Secondary | ICD-10-CM | POA: Diagnosis not present

## 2021-08-16 DIAGNOSIS — R252 Cramp and spasm: Secondary | ICD-10-CM

## 2021-08-16 NOTE — Therapy (Signed)
OUTPATIENT PHYSICAL THERAPY TREATMENT NOTE   Patient Name: Robert Irwin MRN: [id number] DOB:1940-08-02, 81 y.o., male Today's Date: 08/16/2021  PCP: Wenda Low, MD REFERRING PROVIDER: Wenda Low, MD   PT End of Session - 08/16/21 1208     Visit Number 2    Number of Visits 12    Date for PT Re-Evaluation 09/15/21    Authorization Type [employer]    PT Start Time 58    PT Stop Time 1229    PT Time Calculation (min) 42 min             Past Medical History:  Diagnosis Date   A-fib ([place])    Arthritis    Atrial fibrillation ([place])    Diabetes mellitus    Dysrhythmia    ED (erectile dysfunction)    Epistaxis    Hyperlipidemia    Hypertension    Past Surgical History:  Procedure Laterality Date   APPENDECTOMY     NASAL MASS EXCISION     NASAL SINUS SURGERY  12/16/2011   Procedure: ENDOSCOPIC SINUS SURGERY;  Surgeon: Rozetta Nunnery, MD;  Location: [place];  Service: ENT;  Laterality: N/A;  for control of epistaxis   TOTAL HIP ARTHROPLASTY Right 04/29/2021   Procedure: TOTAL HIP ARTHROPLASTY ANTERIOR APPROACH;  Surgeon: Rod Can, MD;  [facility];  Service: Orthopedics;  Laterality: Right;   Patient Active Problem List   Diagnosis Date Noted   Nausea and vomiting 05/07/2021   Weakness 05/07/2021   Dehydration 05/07/2021   Constipation 05/07/2021   Osteoarthritis of right hip 04/29/2021   Encounter for therapeutic drug monitoring 08/22/2013   Atrial fibrillation ([place]) 05/10/2013   Anemia 12/16/2011   Epistaxis 12/14/2011   Hyperlipidemia 12/14/2011   Diabetes mellitus ([place]) 12/14/2011   Diabetes mellitus with coincident hypertension ([place]) 12/14/2011   Atrial fibrillation with RVR ([place]) 12/14/2011  PCP: Wenda Low, MD   REFERRING PROVIDER: Rod Can, MD   REFERRING DIAG: [phone] (ICD-10-CM) - Presence of right artificial hip joint   THERAPY DIAG:  H29.924 (ICD-10-CM) - Presence of right artificial hip joint   ONSET DATE:  04/29/2021   SUBJECTIVE:    SUBJECTIVE STATEMENT: Patient had a total hip replacement on 04/29/2021. He reports he has been walking at home mostly and doing hip abduction exercises. He still feels like he is stiff. He was functioning at a high level prior to surgery. All he has been doing at home it walking and hip abduction.    PERTINENT HISTORY: DMII, Arhtritis in the hand; Afib    PAIN:   No pain just stiffness    PRECAUTIONS: None   WEIGHT BEARING RESTRICTIONS No   FALLS:  Has patient fallen in last 6 months? No, Number of falls: None    LIVING ENVIRONMENT: Has steps in the house> reports he is able to go up them without much difficulty    OCCUPATION:  Retired   Hobbies:  Golf and likes sports    PLOF: [place] to return to golf and an active lifestyle.      OBJECTIVE:    DIAGNOSTIC FINDINGS: nothing post-op      COGNITION:          Overall cognitive status: Within functional limits for tasks assessed                        SENSATION:          Light touch: Appears intact  Stereognosis: Appears intact          Hot/Cold: Appears intact          Proprioception: Appears intact     POSTURE:  Good posture    PALPATION: Significant tightness in patients anterior hip; mild TTP in the patients lateral hip.    LE AROM/PROM:   PROM Right 08/03/2021 Left 08/03/2021  Hip flexion 110 degrees     Hip extension      Hip abduction      Hip adduction      Hip internal rotation No significant limitation     Hip external rotation No significant limitation     Knee flexion      Knee extension      Ankle dorsiflexion      Ankle plantarflexion      Ankle inversion      Ankle eversion       (Blank rows = not tested)   LE MMT:   MMT Right 08/03/2021 Left 08/03/2021  Hip flexion 14.9 22.9  Hip extension      Hip abduction 31.2 33.3  Hip adduction      Hip internal rotation      Hip external rotation      Knee flexion      Knee  extension 39.8 50.3  Ankle dorsiflexion      Ankle plantarflexion      Ankle inversion      Ankle eversion       (Blank rows = not tested)     GAIT: Adjusted patients cane 1 nothch higher. He had a lateral lean into the cane. He is more upright walking withotu the cane. With and without the cane decreased stride length and hip flexion.      TODAY'S TREATMENT: 1/23 'Manual therapy: trigger point release and roller to the anterior hip; manual anterior hip stretch in side lying Bridge 2x10  LTR x20 Thomas stretch off the edge of the table ( unable to reach the floor)     Standing heel raise 2x15   Standing slow march 2x20 each leg       Eval  Manual therapy: trigger point release and roller to the anterior hip   Program Notes Roller to anterior hip        Exercises Supine March - 2 x daily - 7 x weekly - 3 sets - 15 reps Supine Bridge - 2 x daily - 7 x weekly - 3 sets - 10 reps Seated Long Arc Quad - 2 x daily - 7 x weekly - 3 sets - 10 reps     PATIENT EDUCATION:  Education details: self soft tissue mobilization; HEP, symptom mangement  Person educated: Patient Education method: Explanation, Demonstration, Tactile cues, Verbal cues, and Handouts Education comprehension: verbalized understanding, returned demonstration, verbal cues required, tactile cues required, and needs further education     HOME EXERCISE PROGRAM: Access Code: [facility] URL: [email or web address] Date: 08/04/2021 Prepared by: Carolyne Littles   Program Notes Roller to anterior hip        Exercises Supine March - 2 x daily - 7 x weekly - 3 sets - 15 reps Supine Bridge - 2 x daily - 7 x weekly - 3 sets - 10 reps Seated Long Arc Quad - 2 x daily - 7 x weekly - 3 sets - 10 reps     ASSESSMENT:   CLINICAL IMPRESSION: Patients anterior hip remains very stiff but he has been working on his exercises at home.  He was asking about golf. Therapy advised, we will have to progress  him off the cane and get some more hip mobility back. She was given LTR and an anterior hip stretch to work on and home. He was also given weight bearing exercises. He could feel the difference between the two legs. Education given on using his anterior hip stretch. He was also advised where to buy a roller.     Objective impairments include Abnormal gait, decreased activity tolerance, decreased endurance, decreased mobility, difficulty walking, decreased ROM, decreased strength, increased fascial restrictions, and pain. These impairments are limiting patient from golf shopping, community ambulation . Personal factors including 1 comorbidity: a-fib   are also affecting patient's functional outcome. Patient will benefit from skilled PT to address above impairments and improve overall function.   REHAB POTENTIAL: Excellent   CLINICAL DECISION MAKING: Stable/uncomplicated   EVALUATION COMPLEXITY: Low     GOALS: Goals reviewed with patient? Yes   SHORT TERM GOALS:   STG Name Target Date Goal status  1 Patient will report a 50% reduction in pain with activity  Baseline:  08/25/2021 INITIAL  2 Patient will demonstrate full hip ROM without tight feeling at end range  Baseline:  08/25/2021 INITIAL  3 Patient will ambulate 500' without a cane  Baseline: 08/25/2021 INITIAL    LONG TERM GOALS:    LTG Name Target Date Goal status  1 Patient will ambulate in the community without an assistive device  Baseline: 09/15/2021 INITIAL  2 Patient will return to golf If cleared by MD  Baseline: 09/15/2021 INITIAL  3 Patient will go up/down 12 steps without difficulty  Baseline: 09/15/2021 INITIAL  PLAN: PT FREQUENCY: 2x/week   PT DURATION: 6 weeks   PLANNED INTERVENTIONS: Therapeutic exercises, Therapeutic activity, Neuro Muscular re-education, Balance training, Gait training, Patient/Family education, Joint mobilization, Stair training, DME instructions, Aquatic Therapy, Dry Needling, Electrical  stimulation, Cryotherapy, Moist heat, Taping, Ultrasound, and Manual therapy   PLAN FOR NEXT SESSION:continue with manual therapy to the hip. Add standing series of exercises; consider step ups;    Carney Living PT DPT  08/16/2021, 12:53 PM

## 2021-08-24 DIAGNOSIS — H353123 Nonexudative age-related macular degeneration, left eye, advanced atrophic without subfoveal involvement: Secondary | ICD-10-CM | POA: Diagnosis not present

## 2021-08-24 DIAGNOSIS — H40023 Open angle with borderline findings, high risk, bilateral: Secondary | ICD-10-CM | POA: Diagnosis not present

## 2021-08-24 DIAGNOSIS — H04123 Dry eye syndrome of bilateral lacrimal glands: Secondary | ICD-10-CM | POA: Diagnosis not present

## 2021-08-24 DIAGNOSIS — E113391 Type 2 diabetes mellitus with moderate nonproliferative diabetic retinopathy without macular edema, right eye: Secondary | ICD-10-CM | POA: Diagnosis not present

## 2021-08-25 ENCOUNTER — Ambulatory Visit (HOSPITAL_BASED_OUTPATIENT_CLINIC_OR_DEPARTMENT_OTHER): Payer: Medicare HMO | Attending: Orthopedic Surgery | Admitting: Physical Therapy

## 2021-08-25 ENCOUNTER — Encounter (HOSPITAL_BASED_OUTPATIENT_CLINIC_OR_DEPARTMENT_OTHER): Payer: Self-pay | Admitting: Physical Therapy

## 2021-08-25 ENCOUNTER — Other Ambulatory Visit: Payer: Self-pay

## 2021-08-25 DIAGNOSIS — M25651 Stiffness of right hip, not elsewhere classified: Secondary | ICD-10-CM | POA: Diagnosis not present

## 2021-08-25 DIAGNOSIS — R2689 Other abnormalities of gait and mobility: Secondary | ICD-10-CM | POA: Diagnosis not present

## 2021-08-25 DIAGNOSIS — R252 Cramp and spasm: Secondary | ICD-10-CM | POA: Insufficient documentation

## 2021-08-25 NOTE — Therapy (Signed)
OUTPATIENT PHYSICAL THERAPY TREATMENT NOTE   Patient Name: Robert Irwin MRN: [id number] DOB:1940/09/24, 81 y.o., male Today's Date: 08/25/2021  PCP: Wenda Low, MD REFERRING PROVIDER: Wenda Low, MD   PT End of Session - 08/25/21 1132     Visit Number 3    Number of Visits 12    Date for PT Re-Evaluation 09/15/21    Authorization Type [employer]    PT Start Time [phone]    PT Stop Time 1012    PT Time Calculation (min) 41 min    Activity Tolerance Patient tolerated treatment well    Behavior During Therapy [facility] for tasks assessed/performed              Past Medical History:  Diagnosis Date   A-fib ([place])    Arthritis    Atrial fibrillation ([place])    Diabetes mellitus    Dysrhythmia    ED (erectile dysfunction)    Epistaxis    Hyperlipidemia    Hypertension    Past Surgical History:  Procedure Laterality Date   APPENDECTOMY     NASAL MASS EXCISION     NASAL SINUS SURGERY  12/16/2011   Procedure: ENDOSCOPIC SINUS SURGERY;  Surgeon: Rozetta Nunnery, MD;  Location: [place];  Service: ENT;  Laterality: N/A;  for control of epistaxis   TOTAL HIP ARTHROPLASTY Right 04/29/2021   Procedure: TOTAL HIP ARTHROPLASTY ANTERIOR APPROACH;  Surgeon: Rod Can, MD;  Location: [facility];  Service: Orthopedics;  Laterality: Right;   Patient Active Problem List   Diagnosis Date Noted   Nausea and vomiting 05/07/2021   Weakness 05/07/2021   Dehydration 05/07/2021   Constipation 05/07/2021   Osteoarthritis of right hip 04/29/2021   Encounter for therapeutic drug monitoring 08/22/2013   Atrial fibrillation ([place]) 05/10/2013   Anemia 12/16/2011   Epistaxis 12/14/2011   Hyperlipidemia 12/14/2011   Diabetes mellitus ([place]) 12/14/2011   Diabetes mellitus with coincident hypertension ([place]) 12/14/2011   Atrial fibrillation with RVR ([place]) 12/14/2011  PCP: Wenda Low, MD   REFERRING PROVIDER: Rod Can, MD   REFERRING DIAG: [phone] (ICD-10-CM) - Presence of right  artificial hip joint   THERAPY DIAG:  Y30.160 (ICD-10-CM) - Presence of right artificial hip joint   ONSET DATE: 04/29/2021   SUBJECTIVE:    SUBJECTIVE STATEMENT: Patient feels like his symptoms are improving. Marland Kitchen    PERTINENT HISTORY: DMII, Arhtritis in the hand; Afib    PAIN:   No pain just stiffness    PRECAUTIONS: None   WEIGHT BEARING RESTRICTIONS No   FALLS:  Has patient fallen in last 6 months? No, Number of falls: None    LIVING ENVIRONMENT: Has steps in the house> reports he is able to go up them without much difficulty    OCCUPATION:  Retired   Hobbies:  Golf and likes sports    PLOF: [place] to return to golf and an active lifestyle.      OBJECTIVE:    DIAGNOSTIC FINDINGS: nothing post-op      COGNITION:          Overall cognitive status: Within functional limits for tasks assessed                        SENSATION:          Light touch: Appears intact          Stereognosis: Appears intact          Hot/Cold: Appears intact  Proprioception: Appears intact     POSTURE:  Good posture    PALPATION: Significant tightness in patients anterior hip; mild TTP in the patients lateral hip.    LE AROM/PROM:   PROM Right 08/03/2021 Left 08/03/2021  Hip flexion 110 degrees     Hip extension      Hip abduction      Hip adduction      Hip internal rotation No significant limitation     Hip external rotation No significant limitation     Knee flexion      Knee extension      Ankle dorsiflexion      Ankle plantarflexion      Ankle inversion      Ankle eversion       (Blank rows = not tested)   LE MMT:   MMT Right 08/03/2021 Left 08/03/2021  Hip flexion 14.9 22.9  Hip extension      Hip abduction 31.2 33.3  Hip adduction      Hip internal rotation      Hip external rotation      Knee flexion      Knee extension 39.8 50.3  Ankle dorsiflexion      Ankle plantarflexion      Ankle inversion      Ankle eversion        (Blank rows = not tested)     GAIT: Adjusted patients cane 1 nothch higher. He had a lateral lean into the cane. He is more upright walking withotu the cane. With and without the cane decreased stride length and hip flexion.      TODAY'S TREATMENT: 2/1 'Manual therapy: trigger point release and roller to the anterior hip; manual anterior hip stretch in side lying LTRx20  Bridge 2x20  Supine march 2x10  Gait training: walked a lap without he cane  Step up 4 inch 2x10  Lateral step up 2x10 4 inch   Golf specific exercises:   Pallof press 2x10 red  Chop 2x10 red with cuing for hip movement   1/23 'Manual therapy: trigger point release and roller to the anterior hip; manual anterior hip stretch in side lying Bridge 2x10  LTR x20 Thomas stretch off the edge of the table ( unable to reach the floor)     Standing heel raise 2x15   Standing slow march 2x20 each leg       Eval  Manual therapy: trigger point release and roller to the anterior hip   Program Notes Roller to anterior hip        Exercises Supine March - 2 x daily - 7 x weekly - 3 sets - 15 reps Supine Bridge - 2 x daily - 7 x weekly - 3 sets - 10 reps Seated Long Arc Quad - 2 x daily - 7 x weekly - 3 sets - 10 reps     PATIENT EDUCATION:  Education details: self soft tissue mobilization; HEP, symptom mangement  Person educated: Patient Education method: Explanation, Demonstration, Tactile cues, Verbal cues, and Handouts Education comprehension: verbalized understanding, returned demonstration, verbal cues required, tactile cues required, and needs further education     HOME EXERCISE PROGRAM: Access Code: [facility] URL: [email or web address] Date: 08/04/2021 Prepared by: Carolyne Littles   Program Notes Roller to anterior hip        Exercises Supine March - 2 x daily - 7 x weekly - 3 sets - 15 reps Supine Bridge - 2 x daily - 7 x weekly - 3  sets - 10 reps Seated Long Arc Quad  - 2 x daily - 7 x weekly - 3 sets - 10 reps     ASSESSMENT:   CLINICAL IMPRESSION: Patients anterior hip tightness has improved. He is having improved movement with stretching. He tolerated there-ex well today. Therapy added in stair training today. Therapy assessed his gait. He is doing much better. He ambulated without the cane, with good hip rotation and good single leg stance time on the right. He has been using the cane less walking. We also added golf specific training today. He is making great progress.    Objective impairments include Abnormal gait, decreased activity tolerance, decreased endurance, decreased mobility, difficulty walking, decreased ROM, decreased strength, increased fascial restrictions, and pain. These impairments are limiting patient from golf shopping, community ambulation . Personal factors including 1 comorbidity: a-fib   are also affecting patient's functional outcome. Patient will benefit from skilled PT to address above impairments and improve overall function.   REHAB POTENTIAL: Excellent   CLINICAL DECISION MAKING: Stable/uncomplicated   EVALUATION COMPLEXITY: Low     GOALS: Goals reviewed with patient? Yes   SHORT TERM GOALS:   STG Name Target Date Goal status 2/1  1 Patient will report a 50% reduction in pain with activity  Baseline:  08/25/2021 Only mild pain  Progressing   2 Patient will demonstrate full hip ROM without tight feeling at end range  Baseline:  08/25/2021 Mild tightness with extension   3 Patient will ambulate 500' without a cane  Baseline: 08/25/2021 INITIAL Ambulating without a cane today, but still walking    LONG TERM GOALS:    LTG Name Target Date Goal status  1 Patient will ambulate in the community without an assistive device  Baseline: 09/15/2021 INITIAL  2 Patient will return to golf If cleared by MD  Baseline: 09/15/2021 INITIAL  3 Patient will go up/down 12 steps without difficulty  Baseline: 09/15/2021 INITIAL   PLAN: PT FREQUENCY: 2x/week   PT DURATION: 6 weeks   PLANNED INTERVENTIONS: Therapeutic exercises, Therapeutic activity, Neuro Muscular re-education, Balance training, Gait training, Patient/Family education, Joint mobilization, Stair training, DME instructions, Aquatic Therapy, Dry Needling, Electrical stimulation, Cryotherapy, Moist heat, Taping, Ultrasound, and Manual therapy   PLAN FOR NEXT SESSION:continue with manual therapy to the hip. Add standing series of exercises; consider step ups;    Carney Living PT DPT  08/25/2021, 11:33 AM

## 2021-08-27 ENCOUNTER — Encounter (HOSPITAL_BASED_OUTPATIENT_CLINIC_OR_DEPARTMENT_OTHER): Payer: Medicare HMO | Admitting: Physical Therapy

## 2021-08-31 ENCOUNTER — Ambulatory Visit (HOSPITAL_BASED_OUTPATIENT_CLINIC_OR_DEPARTMENT_OTHER): Payer: Medicare HMO | Attending: Orthopedic Surgery | Admitting: Physical Therapy

## 2021-08-31 ENCOUNTER — Encounter (HOSPITAL_BASED_OUTPATIENT_CLINIC_OR_DEPARTMENT_OTHER): Payer: Self-pay | Admitting: Physical Therapy

## 2021-08-31 ENCOUNTER — Other Ambulatory Visit: Payer: Self-pay

## 2021-08-31 DIAGNOSIS — M25651 Stiffness of right hip, not elsewhere classified: Secondary | ICD-10-CM | POA: Insufficient documentation

## 2021-08-31 DIAGNOSIS — R2689 Other abnormalities of gait and mobility: Secondary | ICD-10-CM | POA: Insufficient documentation

## 2021-08-31 DIAGNOSIS — R252 Cramp and spasm: Secondary | ICD-10-CM | POA: Diagnosis not present

## 2021-08-31 NOTE — Therapy (Signed)
OUTPATIENT PHYSICAL THERAPY TREATMENT NOTE   Patient Name: Robert Irwin MRN: [id number] DOB:[birth year], 81 y.o., male Today's Date: 08/31/2021  PCP: Wenda Low, MD REFERRING PROVIDER: Wenda Low, MD   PT End of Session - 08/31/21 1103     Visit Number 4    Number of Visits 12    Date for PT Re-Evaluation 09/15/21    Authorization Type [employer]    PT Start Time 1100    PT Stop Time 6010    PT Time Calculation (min) 42 min    Activity Tolerance Patient tolerated treatment well    Behavior During Therapy WFL for tasks assessed/performed              Past Medical History:  Diagnosis Date   A-fib ([place])    Arthritis    Atrial fibrillation ([place])    Diabetes mellitus    Dysrhythmia    ED (erectile dysfunction)    Epistaxis    Hyperlipidemia    Hypertension    Past Surgical History:  Procedure Laterality Date   APPENDECTOMY     NASAL MASS EXCISION     NASAL SINUS SURGERY  12/16/2011   Procedure: ENDOSCOPIC SINUS SURGERY;  Surgeon: Rozetta Nunnery, MD;  Location: [place];  Service: ENT;  Laterality: N/A;  for control of epistaxis   TOTAL HIP ARTHROPLASTY Right 04/29/2021   Procedure: TOTAL HIP ARTHROPLASTY ANTERIOR APPROACH;  Surgeon: Rod Can, MD;  [facility];  Service: Orthopedics;  Laterality: Right;   Patient Active Problem List   Diagnosis Date Noted   Nausea and vomiting 05/07/2021   Weakness 05/07/2021   Dehydration 05/07/2021   Constipation 05/07/2021   Osteoarthritis of right hip 04/29/2021   Encounter for therapeutic drug monitoring 08/22/2013   Atrial fibrillation ([place]) 05/10/2013   Anemia 12/16/2011   Epistaxis 12/14/2011   Hyperlipidemia 12/14/2011   Diabetes mellitus ([place]) 12/14/2011   Diabetes mellitus with coincident hypertension ([place]) 12/14/2011   Atrial fibrillation with RVR ([place]) 12/14/2011  PCP: Wenda Low, MD   REFERRING PROVIDER: Rod Can, MD   REFERRING DIAG: [phone] (ICD-10-CM) - Presence of right  artificial hip joint   THERAPY DIAG:  D32.202 (ICD-10-CM) - Presence of right artificial hip joint   ONSET DATE: 04/29/2021   SUBJECTIVE:    SUBJECTIVE STATEMENT: Patient feels like his symptoms are improving. Marland Kitchen    PERTINENT HISTORY: DMII, Arhtritis in the hand; Afib    PAIN:   No pain just stiffness    PRECAUTIONS: None   WEIGHT BEARING RESTRICTIONS No   FALLS:  Has patient fallen in last 6 months? No, Number of falls: None    LIVING ENVIRONMENT: Has steps in the house> reports he is able to go up them without much difficulty    OCCUPATION:  Retired   Hobbies:  Golf and likes sports    PLOF: [place] to return to golf and an active lifestyle.      OBJECTIVE:    DIAGNOSTIC FINDINGS: nothing post-op      COGNITION:          Overall cognitive status: Within functional limits for tasks assessed                        SENSATION:          Light touch: Appears intact          Stereognosis: Appears intact          Hot/Cold: Appears intact  Proprioception: Appears intact     POSTURE:  Good posture    PALPATION: Significant tightness in patients anterior hip; mild TTP in the patients lateral hip.    LE AROM/PROM:   GAIT: Adjusted patients cane 1 nothch higher. He had a lateral lean into the cane. He is more upright walking withotu the cane. With and without the cane decreased stride length and hip flexion.      TODAY'S TREATMENT: 2/7 Manual therapy: trigger point release and roller to the anterior hip; manual anterior hip stretch in side lying  Supine LTR x20  Supine march 2x10  Bridge with green band 2x10   Leg press 50 lbs 3x10   Cable chop 10;bs 2x10 each direction  Pallof press 2x10 each direction 5 lbs       2/1 Manual therapy: trigger point release and roller to the anterior hip; manual anterior hip stretch in side lying LTRx20  Bridge 2x20  Supine march 2x10  Gait training: walked a lap without he  cane  Step up 4 inch 2x10  Lateral step up 2x10 4 inch   Golf specific exercises:   Pallof press 2x10 red  Chop 2x10 red with cuing for hip movement   1/23 'Manual therapy: trigger point release and roller to the anterior hip; manual anterior hip stretch in side lying Bridge 2x10  LTR x20 Thomas stretch off the edge of the table ( unable to reach the floor)     Standing heel raise 2x15   Standing slow march 2x20 each leg       Eval  Manual therapy: trigger point release and roller to the anterior hip   Program Notes Roller to anterior hip        Exercises Supine March - 2 x daily - 7 x weekly - 3 sets - 15 reps Supine Bridge - 2 x daily - 7 x weekly - 3 sets - 10 reps Seated Long Arc Quad - 2 x daily - 7 x weekly - 3 sets - 10 reps     PATIENT EDUCATION:  Education details: self soft tissue mobilization; HEP, symptom mangement  Person educated: Patient Education method: Explanation, Demonstration, Tactile cues, Verbal cues, and Handouts Education comprehension: verbalized understanding, returned demonstration, verbal cues required, tactile cues required, and needs further education     HOME EXERCISE PROGRAM: Access Code: [facility] URL: [email or web address] Date: 08/04/2021 Prepared by: Carolyne Littles   Program Notes Roller to anterior hip        Exercises Supine March - 2 x daily - 7 x weekly - 3 sets - 15 reps Supine Bridge - 2 x daily - 7 x weekly - 3 sets - 10 reps Seated Long Arc Quad - 2 x daily - 7 x weekly - 3 sets - 10 reps     ASSESSMENT:   CLINICAL IMPRESSION:  Patient continues to improve. He is having less tightness in his anterior hip. His gait is improving per visual inspection. He is using the cane less and less. He is nervous about going back to play golf. He was advised he will know when he is ready> We discussed starting out hitting high wedges then progressing, but not until his balance is better. He had no increase  pain with treatment. We will work on steps next visit.    Objective impairments include Abnormal gait, decreased activity tolerance, decreased endurance, decreased mobility, difficulty walking, decreased ROM, decreased strength, increased fascial restrictions, and pain. These impairments are limiting patient from golf  shopping, community ambulation . Personal factors including 1 comorbidity: a-fib   are also affecting patient's functional outcome. Patient will benefit from skilled PT to address above impairments and improve overall function.   REHAB POTENTIAL: Excellent   CLINICAL DECISION MAKING: Stable/uncomplicated   EVALUATION COMPLEXITY: Low     GOALS: Goals reviewed with patient? Yes   SHORT TERM GOALS:   STG Name Target Date Goal status 2/1  1 Patient will report a 50% reduction in pain with activity  Baseline:  08/25/2021 Only mild pain  Progressing   2 Patient will demonstrate full hip ROM without tight feeling at end range  Baseline:  08/25/2021 Mild tightness with extension   3 Patient will ambulate 500' without a cane  Baseline: 08/25/2021 INITIAL Ambulating without a cane today, but still walking    LONG TERM GOALS:    LTG Name Target Date Goal status  1 Patient will ambulate in the community without an assistive device  Baseline: 09/15/2021 INITIAL  2 Patient will return to golf If cleared by MD  Baseline: 09/15/2021 INITIAL  3 Patient will go up/down 12 steps without difficulty  Baseline: 09/15/2021 INITIAL  PLAN: PT FREQUENCY: 2x/week   PT DURATION: 6 weeks   PLANNED INTERVENTIONS: Therapeutic exercises, Therapeutic activity, Neuro Muscular re-education, Balance training, Gait training, Patient/Family education, Joint mobilization, Stair training, DME instructions, Aquatic Therapy, Dry Needling, Electrical stimulation, Cryotherapy, Moist heat, Taping, Ultrasound, and Manual therapy   PLAN FOR NEXT SESSION:continue with manual therapy to the hip. Add standing series  of exercises; consider step ups;    Carney Living PT DPT  08/31/2021, 11:18 AM

## 2021-09-01 ENCOUNTER — Encounter (HOSPITAL_BASED_OUTPATIENT_CLINIC_OR_DEPARTMENT_OTHER): Payer: Medicare HMO | Admitting: Physical Therapy

## 2021-09-02 DIAGNOSIS — M65332 Trigger finger, left middle finger: Secondary | ICD-10-CM | POA: Diagnosis not present

## 2021-09-02 DIAGNOSIS — M65342 Trigger finger, left ring finger: Secondary | ICD-10-CM | POA: Diagnosis not present

## 2021-09-03 ENCOUNTER — Encounter (HOSPITAL_BASED_OUTPATIENT_CLINIC_OR_DEPARTMENT_OTHER): Payer: Medicare HMO | Admitting: Physical Therapy

## 2021-09-08 ENCOUNTER — Encounter (HOSPITAL_BASED_OUTPATIENT_CLINIC_OR_DEPARTMENT_OTHER): Payer: Self-pay | Admitting: Physical Therapy

## 2021-09-08 ENCOUNTER — Other Ambulatory Visit: Payer: Self-pay

## 2021-09-08 ENCOUNTER — Ambulatory Visit (HOSPITAL_BASED_OUTPATIENT_CLINIC_OR_DEPARTMENT_OTHER): Payer: Medicare HMO | Admitting: Physical Therapy

## 2021-09-08 DIAGNOSIS — R252 Cramp and spasm: Secondary | ICD-10-CM

## 2021-09-08 DIAGNOSIS — M25651 Stiffness of right hip, not elsewhere classified: Secondary | ICD-10-CM

## 2021-09-08 DIAGNOSIS — R2689 Other abnormalities of gait and mobility: Secondary | ICD-10-CM | POA: Diagnosis not present

## 2021-09-08 NOTE — Therapy (Signed)
OUTPATIENT PHYSICAL THERAPY TREATMENT NOTE   Patient Name: Robert Irwin MRN: [id number] DOB:1941/07/04, 81 y.o., male Today's Date: 09/08/2021  PCP: Wenda Low, MD REFERRING PROVIDER: Wenda Low, MD   PT End of Session - 09/08/21 0935     Visit Number 5    Number of Visits 12    Date for PT Re-Evaluation 09/15/21    Authorization Type [employer]    PT Start Time 0932    PT Stop Time 1013    PT Time Calculation (min) 41 min    Activity Tolerance Patient tolerated treatment well    Behavior During Therapy WFL for tasks assessed/performed              Past Medical History:  Diagnosis Date   A-fib ([place])    Arthritis    Atrial fibrillation ([place])    Diabetes mellitus    Dysrhythmia    ED (erectile dysfunction)    Epistaxis    Hyperlipidemia    Hypertension    Past Surgical History:  Procedure Laterality Date   APPENDECTOMY     NASAL MASS EXCISION     NASAL SINUS SURGERY  12/16/2011   Procedure: ENDOSCOPIC SINUS SURGERY;  Surgeon: Rozetta Nunnery, MD;  Location: [place];  Service: ENT;  Laterality: N/A;  for control of epistaxis   TOTAL HIP ARTHROPLASTY Right 04/29/2021   Procedure: TOTAL HIP ARTHROPLASTY ANTERIOR APPROACH;  Surgeon: Rod Can, MD;  [facility];  Service: Orthopedics;  Laterality: Right;   Patient Active Problem List   Diagnosis Date Noted   Nausea and vomiting 05/07/2021   Weakness 05/07/2021   Dehydration 05/07/2021   Constipation 05/07/2021   Osteoarthritis of right hip 04/29/2021   Encounter for therapeutic drug monitoring 08/22/2013   Atrial fibrillation ([place]) 05/10/2013   Anemia 12/16/2011   Epistaxis 12/14/2011   Hyperlipidemia 12/14/2011   Diabetes mellitus ([place]) 12/14/2011   Diabetes mellitus with coincident hypertension ([place]) 12/14/2011   Atrial fibrillation with RVR ([place]) 12/14/2011  PCP: Wenda Low, MD   REFERRING PROVIDER: Rod Can, MD   REFERRING DIAG: [phone] (ICD-10-CM) - Presence of right  artificial hip joint   THERAPY DIAG:  K59.935 (ICD-10-CM) - Presence of right artificial hip joint   ONSET DATE: 04/29/2021   SUBJECTIVE:    SUBJECTIVE STATEMENT: Patient continues to report improvement. He is having minor discomfort in the incision site.  PERTINENT HISTORY: DMII, Arhtritis in the hand; Afib    PAIN:   No pain just stiffness    PRECAUTIONS: None   WEIGHT BEARING RESTRICTIONS No   FALLS:  Has patient fallen in last 6 months? No, Number of falls: None    LIVING ENVIRONMENT: Has steps in the house> reports he is able to go up them without much difficulty    OCCUPATION:  Retired   Hobbies:  Golf and likes sports    PLOF: [place] to return to golf and an active lifestyle.      OBJECTIVE:    DIAGNOSTIC FINDINGS: nothing post-op      COGNITION:          Overall cognitive status: Within functional limits for tasks assessed                        SENSATION:          Light touch: Appears intact          Stereognosis: Appears intact  Hot/Cold: Appears intact          Proprioception: Appears intact     POSTURE:  Good posture    PALPATION: Significant tightness in patients anterior hip; mild TTP in the patients lateral hip.    LE AROM/PROM:   GAIT: Adjusted patients cane 1 nothch higher. He had a lateral lean into the cane. He is more upright walking withotu the cane. With and without the cane decreased stride length and hip flexion.      TODAY'S TREATMENT: 2/15 Manual therapy: trigger point release and roller to the anterior hip; manual anterior hip stretch in side lying; IT band rolling   Supine   march with green band 2x15 green  Supine hip abduction 2x15 green  Supine bridge with band 2x15   Step up 4 inch 2x10  Lateral step up 2x10 4 inch  Step down 2 inch 2x10    2/7 Manual therapy: trigger point release and roller to the anterior hip; manual anterior hip stretch in side lying  Supine LTR x20   Supine march 2x10  Bridge with green band 2x10   Leg press 50 lbs 3x10   Cable chop 10;bs 2x10 each direction  Pallof press 2x10 each direction 5 lbs       2/1 Manual therapy: trigger point release and roller to the anterior hip; manual anterior hip stretch in side lying LTRx20  Bridge 2x20  Supine march 2x10  Gait training: walked a lap without he cane  Step up 4 inch 2x10  Lateral step up 2x10 4 inch   Golf specific exercises:   Pallof press 2x10 red  Chop 2x10 red with cuing for hip movement   1/23 'Manual therapy: trigger point release and roller to the anterior hip; manual anterior hip stretch in side lying Bridge 2x10  LTR x20 Thomas stretch off the edge of the table ( unable to reach the floor)     Standing heel raise 2x15   Standing slow march 2x20 each leg       Eval  Manual therapy: trigger point release and roller to the anterior hip   Program Notes Roller to anterior hip        Exercises Supine March - 2 x daily - 7 x weekly - 3 sets - 15 reps Supine Bridge - 2 x daily - 7 x weekly - 3 sets - 10 reps Seated Long Arc Quad - 2 x daily - 7 x weekly - 3 sets - 10 reps     PATIENT EDUCATION:  Education details: self soft tissue mobilization; HEP, symptom mangement  Person educated: Patient Education method: Explanation, Demonstration, Tactile cues, Verbal cues, and Handouts Education comprehension: verbalized understanding, returned demonstration, verbal cues required, tactile cues required, and needs further education     HOME EXERCISE PROGRAM: Access Code: [facility] URL: [email or web address] Date: 08/04/2021 Prepared by: Carolyne Littles   Program Notes Roller to anterior hip        Exercises Supine March - 2 x daily - 7 x weekly - 3 sets - 15 reps Supine Bridge - 2 x daily - 7 x weekly - 3 sets - 10 reps Seated Long Arc Quad - 2 x daily - 7 x weekly - 3 sets - 10 reps     ASSESSMENT:   CLINICAL  IMPRESSION: Therapy advanced his stair height today and the resistance of his bands. He reported some fatigue, but no significant increase in pain. The tension in his anterior hip appears to be  improving. He is stable to stretch to neutral extension without significant resistance. Therapy also added in squats today. He had great technique with squats. Therapy will continue to progess as tolerated.    Objective impairments include Abnormal gait, decreased activity tolerance, decreased endurance, decreased mobility, difficulty walking, decreased ROM, decreased strength, increased fascial restrictions, and pain. These impairments are limiting patient from golf shopping, community ambulation . Personal factors including 1 comorbidity: a-fib   are also affecting patient's functional outcome. Patient will benefit from skilled PT to address above impairments and improve overall function.   REHAB POTENTIAL: Excellent   CLINICAL DECISION MAKING: Stable/uncomplicated   EVALUATION COMPLEXITY: Low     GOALS: Goals reviewed with patient? Yes   SHORT TERM GOALS:   STG Name Target Date Goal status 2/1  1 Patient will report a 50% reduction in pain with activity  Baseline:  08/25/2021 Only mild pain  Progressing   2 Patient will demonstrate full hip ROM without tight feeling at end range  Baseline:  08/25/2021 Mild tightness with extension   3 Patient will ambulate 500' without a cane  Baseline: 08/25/2021 INITIAL Ambulating without a cane today, but still walking    LONG TERM GOALS:    LTG Name Target Date Goal status  1 Patient will ambulate in the community without an assistive device  Baseline: 09/15/2021 INITIAL  2 Patient will return to golf If cleared by MD  Baseline: 09/15/2021 INITIAL  3 Patient will go up/down 12 steps without difficulty  Baseline: 09/15/2021 INITIAL  PLAN: PT FREQUENCY: 2x/week   PT DURATION: 6 weeks   PLANNED INTERVENTIONS: Therapeutic exercises, Therapeutic activity,  Neuro Muscular re-education, Balance training, Gait training, Patient/Family education, Joint mobilization, Stair training, DME instructions, Aquatic Therapy, Dry Needling, Electrical stimulation, Cryotherapy, Moist heat, Taping, Ultrasound, and Manual therapy   PLAN FOR NEXT SESSION:continue with manual therapy to the hip. Add standing series of exercises; consider step ups;    Carney Living PT DPT  09/08/2021, 9:52 AM

## 2021-09-10 ENCOUNTER — Encounter (HOSPITAL_BASED_OUTPATIENT_CLINIC_OR_DEPARTMENT_OTHER): Payer: Medicare HMO | Admitting: Physical Therapy

## 2021-09-15 ENCOUNTER — Ambulatory Visit (HOSPITAL_BASED_OUTPATIENT_CLINIC_OR_DEPARTMENT_OTHER): Payer: Medicare HMO | Admitting: Physical Therapy

## 2021-09-15 ENCOUNTER — Other Ambulatory Visit: Payer: Self-pay

## 2021-09-15 ENCOUNTER — Encounter (HOSPITAL_BASED_OUTPATIENT_CLINIC_OR_DEPARTMENT_OTHER): Payer: Self-pay | Admitting: Physical Therapy

## 2021-09-15 DIAGNOSIS — R252 Cramp and spasm: Secondary | ICD-10-CM | POA: Diagnosis not present

## 2021-09-15 DIAGNOSIS — M25651 Stiffness of right hip, not elsewhere classified: Secondary | ICD-10-CM

## 2021-09-15 DIAGNOSIS — R2689 Other abnormalities of gait and mobility: Secondary | ICD-10-CM | POA: Diagnosis not present

## 2021-09-15 NOTE — Therapy (Signed)
OUTPATIENT PHYSICAL THERAPY TREATMENT NOTE   Patient Name: Robert Irwin MRN: [id number] DOB:08-13-1940, 81 y.o., male Today's Date: 09/15/2021  PCP: Wenda Low, MD REFERRING PROVIDER: Wenda Low, MD   PT End of Session - 09/15/21 [id number]     Visit Number 6    Number of Visits 12    Date for PT Re-Evaluation 09/15/21    Authorization Type [employer]    PT Start Time 0930    PT Stop Time 1012    PT Time Calculation (min) 42 min    Activity Tolerance Patient tolerated treatment well    Behavior During Therapy [facility] for tasks assessed/performed               Past Medical History:  Diagnosis Date   A-fib ([place])    Arthritis    Atrial fibrillation ([place])    Diabetes mellitus    Dysrhythmia    ED (erectile dysfunction)    Epistaxis    Hyperlipidemia    Hypertension    Past Surgical History:  Procedure Laterality Date   APPENDECTOMY     NASAL MASS EXCISION     NASAL SINUS SURGERY  12/16/2011   Procedure: ENDOSCOPIC SINUS SURGERY;  Surgeon: Rozetta Nunnery, MD;  Location: [place];  Service: ENT;  Laterality: N/A;  for control of epistaxis   TOTAL HIP ARTHROPLASTY Right 04/29/2021   Procedure: TOTAL HIP ARTHROPLASTY ANTERIOR APPROACH;  Surgeon: Rod Can, MD;  Location: [facility];  Service: Orthopedics;  Laterality: Right;   Patient Active Problem List   Diagnosis Date Noted   Nausea and vomiting 05/07/2021   Weakness 05/07/2021   Dehydration 05/07/2021   Constipation 05/07/2021   Osteoarthritis of right hip 04/29/2021   Encounter for therapeutic drug monitoring 08/22/2013   Atrial fibrillation ([place]) 05/10/2013   Anemia 12/16/2011   Epistaxis 12/14/2011   Hyperlipidemia 12/14/2011   Diabetes mellitus ([place]) 12/14/2011   Diabetes mellitus with coincident hypertension ([place]) 12/14/2011   Atrial fibrillation with RVR ([place]) 12/14/2011  PCP: Wenda Low, MD   REFERRING PROVIDER: Rod Can, MD   REFERRING DIAG: [phone] (ICD-10-CM) - Presence of right  artificial hip joint   THERAPY DIAG:  W10.932 (ICD-10-CM) - Presence of right artificial hip joint   ONSET DATE: 04/29/2021   SUBJECTIVE:    SUBJECTIVE STATEMENT: Patient reports no pain just tightness in the hip.   PERTINENT HISTORY: DMII, Arhtritis in the hand; Afib    PAIN:   No pain just stiffness    PRECAUTIONS: None   WEIGHT BEARING RESTRICTIONS No   FALLS:  Has patient fallen in last 6 months? No, Number of falls: None    LIVING ENVIRONMENT: Has steps in the house> reports he is able to go up them without much difficulty    OCCUPATION:  Retired   Hobbies:  Golf and likes sports    PLOF: [place] to return to golf and an active lifestyle.      OBJECTIVE:    DIAGNOSTIC FINDINGS: nothing post-op      COGNITION:          Overall cognitive status: Within functional limits for tasks assessed                        SENSATION:          Light touch: Appears intact          Stereognosis: Appears intact          Hot/Cold: Appears  intact          Proprioception: Appears intact     POSTURE:  Good posture    PALPATION: Significant tightness in patients anterior hip; mild TTP in the patients lateral hip.    LE AROM/PROM:    PALPATION: Significant tightness in patients anterior hip; mild TTP in the patients lateral hip.    LE AROM/PROM:   PROM Right 08/03/2021 Left 08/03/2021  Hip flexion 110 degrees  2/20 120     Hip extension      Hip abduction      Hip adduction      Hip internal rotation No significant limitation     Hip external rotation No significant limitation     Knee flexion      Knee extension      Ankle dorsiflexion      Ankle plantarflexion      Ankle inversion      Ankle eversion       (Blank rows = not tested)   LE MMT:   MMT Right 08/03/2021 Left 08/03/2021  Hip flexion 14.9  28.5 22.9  30.0  Hip extension      Hip abduction 31.2  41.0 33.3  44.5  Hip adduction      Hip internal rotation       Hip external rotation      Knee flexion      Knee extension 39.8  35.9 50.3  35.4  Ankle dorsiflexion      Ankle plantarflexion      Ankle inversion      Ankle eversion       (Blank rows = not tested)     GAIT: Adjusted patients cane 1 nothch higher. He had a lateral lean into the cane. He is more upright walking withotu the cane. With and without the cane decreased stride length and hip flexion.     GAIT: Adjusted patients cane 1 nothch higher. He had a lateral lean into the cane. He is more upright walking withotu the cane. With and without the cane decreased stride length and hip flexion.      TODAY'S TREATMENT: 2/22 march with green band 2x15 green  Supine hip abduction 2x15 green  Supine bridge with band 2x15   Step up 6 nch 2x10  Lateral step up 2x10 6 inch  Step down 2 inch 2x10   Manual therapy: trigger point release and roller to the anterior hip; manual anterior hip stretch in side lying; IT band rolling   Tested and reviewed results of strength and motion testing  2/15 Manual therapy: trigger point release and roller to the anterior hip; manual anterior hip stretch in side lying; IT band rolling   Supine   march with green band 2x15 green  Supine hip abduction 2x15 green  Supine bridge with band 2x15   Step up 4 inch 2x10  Lateral step up 2x10 4 inch  Step down 2 inch 2x10    2/7 Manual therapy: trigger point release and roller to the anterior hip; manual anterior hip stretch in side lying  Supine LTR x20  Supine march 2x10  Bridge with green band 2x10   Leg press 50 lbs 3x10   Cable chop 10;bs 2x10 each direction  Pallof press 2x10 each direction 5 lbs             PATIENT EDUCATION:  Education details: POC going forward  Person educated: Patient Education method: Explanation, Demonstration, Corporate treasurer cues, Verbal cues, and Handouts Education  comprehension: verbalized understanding, returned demonstration, verbal cues required,  tactile cues required, and needs further education     HOME EXERCISE PROGRAM: Access Code: [email or web address] URL: [email or web address] Date: 08/04/2021 Prepared by: Carolyne Littles   Program Notes Roller to anterior hip        Exercises Supine March - 2 x daily - 7 x weekly - 3 sets - 15 reps Supine Bridge - 2 x daily - 7 x weekly - 3 sets - 10 reps Seated Long Arc Quad - 2 x daily - 7 x weekly - 3 sets - 10 reps     ASSESSMENT:   CLINICAL IMPRESSION: The patient is progressing well. His strength measurements a have all increased except knee extension. He has been diligent about his exercises at home. He is using hte cane much less. He feels like his balance is better. His hip flexion has improved to 115 from 110. Our Otilio Carpen will be 120. We were able to advance his bands today. His over all goal will be golf. We have worked on his rotational stability some. He was advised golf would be an activity that he would likely return to at the earliest 6 months and he would need to talk to his MD first. We will continue to advance his exercises as tolerated. He would benefit from further skilled therapy 1W6   Objective impairments include Abnormal gait, decreased activity tolerance, decreased endurance, decreased mobility, difficulty walking, decreased ROM, decreased strength, increased fascial restrictions, and pain. These impairments are limiting patient from golf shopping, community ambulation . Personal factors including 1 comorbidity: a-fib   are also affecting patient's functional outcome. Patient will benefit from skilled PT to address above impairments and improve overall function.   REHAB POTENTIAL: Excellent   CLINICAL DECISION MAKING: Stable/uncomplicated   EVALUATION COMPLEXITY: Low     GOALS: Goals reviewed with patient? Yes   SHORT TERM GOALS:   STG Name Target Date Goal status 2/22  1 Patient will report a 50% reduction in pain with activity  Baseline:  08/25/2021  Achieved No pain just tightness   2 Patient will demonstrate full hip ROM without tight feeling at end range  Baseline:  08/25/2021 5 degrees away from 120 degrees of flexion  Progressing    3 Patient will ambulate 500' without a cane  Baseline: 08/25/2021  Ambulating without a cane today Achieved     LONG TERM GOALS:    LTG Name Target Date Goal status  1 Patient will ambulate in the community without an assistive device  Baseline: 09/15/2021 Progressing; only limited use of his cane   2 Patient will return to golf If cleared by MD  Baseline: 09/15/2021 IProgressing Patient is working on golf activity   3 Patient will go up/down 12 steps without difficulty  Baseline: 09/15/2021 Progressing stairs   PLAN: PT FREQUENCY: 1x/week   PT DURATION: 6 weeks   PLANNED INTERVENTIONS: Therapeutic exercises, Therapeutic activity, Neuro Muscular re-education, Balance training, Gait training, Patient/Family education, Joint mobilization, Stair training, DME instructions, Aquatic Therapy, Dry Needling, Electrical stimulation, Cryotherapy, Moist heat, Taping, Ultrasound, and Manual therapy   PLAN FOR NEXT SESSION:continue with manual therapy to the hip. Add standing series of exercises; consider step ups;    Carney Living PT DPT  09/15/2021, 7:45 PM

## 2021-09-17 ENCOUNTER — Encounter (HOSPITAL_BASED_OUTPATIENT_CLINIC_OR_DEPARTMENT_OTHER): Payer: Medicare HMO | Admitting: Physical Therapy

## 2021-09-21 ENCOUNTER — Other Ambulatory Visit: Payer: Self-pay

## 2021-09-21 ENCOUNTER — Ambulatory Visit (HOSPITAL_BASED_OUTPATIENT_CLINIC_OR_DEPARTMENT_OTHER): Payer: Medicare HMO | Admitting: Physical Therapy

## 2021-09-21 ENCOUNTER — Encounter (HOSPITAL_BASED_OUTPATIENT_CLINIC_OR_DEPARTMENT_OTHER): Payer: Self-pay | Admitting: Physical Therapy

## 2021-09-21 DIAGNOSIS — R2689 Other abnormalities of gait and mobility: Secondary | ICD-10-CM

## 2021-09-21 DIAGNOSIS — M25651 Stiffness of right hip, not elsewhere classified: Secondary | ICD-10-CM

## 2021-09-21 DIAGNOSIS — R252 Cramp and spasm: Secondary | ICD-10-CM

## 2021-09-21 NOTE — Therapy (Signed)
OUTPATIENT PHYSICAL THERAPY TREATMENT NOTE   Patient Name: Robert Irwin MRN: [id number] DOB:1941/02/15, 81 y.o., male Today's Date: 09/21/2021  PCP: Wenda Low, MD REFERRING PROVIDER: Wenda Low, MD   PT End of Session - 09/21/21 1543     Visit Number 7    Number of Visits 12    Date for PT Re-Evaluation 10/27/21    Authorization Type [employer]    PT Start Time 4098    PT Stop Time 1191    PT Time Calculation (min) 43 min    Activity Tolerance Patient tolerated treatment well    Behavior During Therapy WFL for tasks assessed/performed                Past Medical History:  Diagnosis Date   A-fib ([place])    Arthritis    Atrial fibrillation ([place])    Diabetes mellitus    Dysrhythmia    ED (erectile dysfunction)    Epistaxis    Hyperlipidemia    Hypertension    Past Surgical History:  Procedure Laterality Date   APPENDECTOMY     NASAL MASS EXCISION     NASAL SINUS SURGERY  12/16/2011   Procedure: ENDOSCOPIC SINUS SURGERY;  Surgeon: Rozetta Nunnery, MD;  Location: [place];  Service: ENT;  Laterality: N/A;  for control of epistaxis   TOTAL HIP ARTHROPLASTY Right 04/29/2021   Procedure: TOTAL HIP ARTHROPLASTY ANTERIOR APPROACH;  Surgeon: Rod Can, MD;  Location[facility];  Service: Orthopedics;  Laterality: Right;   Patient Active Problem List   Diagnosis Date Noted   Nausea and vomiting 05/07/2021   Weakness 05/07/2021   Dehydration 05/07/2021   Constipation 05/07/2021   Osteoarthritis of right hip 04/29/2021   Encounter for therapeutic drug monitoring 08/22/2013   Atrial fibrillation ([place]) 05/10/2013   Anemia 12/16/2011   Epistaxis 12/14/2011   Hyperlipidemia 12/14/2011   Diabetes mellitus ([place]) 12/14/2011   Diabetes mellitus with coincident hypertension ([place]) 12/14/2011   Atrial fibrillation with RVR ([place]) 12/14/2011  PCP: Wenda Low, MD   REFERRING PROVIDER: Rod Can, MD   REFERRING DIAG: [id number] (ICD-10-CM) - Presence of right  artificial hip joint   THERAPY DIAG:  A21.308 (ICD-10-CM) - Presence of right artificial hip joint   ONSET DATE: 04/29/2021   SUBJECTIVE:    SUBJECTIVE STATEMENT: Patient reports no pain just tightness in the hip.   PERTINENT HISTORY: DMII, Arhtritis in the hand; Afib    PAIN:   No pain just stiffness    PRECAUTIONS: None   WEIGHT BEARING RESTRICTIONS No   FALLS:  Has patient fallen in last 6 months? No, Number of falls: None    LIVING ENVIRONMENT: Has steps in the house> reports he is able to go up them without much difficulty    OCCUPATION:  Retired   Hobbies:  Golf and likes sports    PLOF: [place] to return to golf and an active lifestyle.      OBJECTIVE:    DIAGNOSTIC FINDINGS: nothing post-op      COGNITION:          Overall cognitive status: Within functional limits for tasks assessed                        SENSATION:          Light touch: Appears intact          Stereognosis: Appears intact          Hot/Cold:  Appears intact          Proprioception: Appears intact     POSTURE:  Good posture    PALPATION: Significant tightness in patients anterior hip; mild TTP in the patients lateral hip.    LE AROM/PROM:    PALPATION: Significant tightness in patients anterior hip; mild TTP in the patients lateral hip.    LE AROM/PROM:   PROM Right 08/03/2021 Left 08/03/2021  Hip flexion 110 degrees  2/20 120     Hip extension      Hip abduction      Hip adduction      Hip internal rotation No significant limitation     Hip external rotation No significant limitation     Knee flexion      Knee extension      Ankle dorsiflexion      Ankle plantarflexion      Ankle inversion      Ankle eversion       (Blank rows = not tested)   LE MMT:   MMT Right 08/03/2021 Left 08/03/2021  Hip flexion 14.9  28.5 22.9  30.0  Hip extension      Hip abduction 31.2  41.0 33.3  44.5  Hip adduction      Hip internal rotation       Hip external rotation      Knee flexion      Knee extension 39.8  35.9 50.3  35.4  Ankle dorsiflexion      Ankle plantarflexion      Ankle inversion      Ankle eversion       (Blank rows = not tested)     GAIT: Adjusted patients cane 1 nothch higher. He had a lateral lean into the cane. He is more upright walking withotu the cane. With and without the cane decreased stride length and hip flexion.     GAIT: Adjusted patients cane 1 nothch higher. He had a lateral lean into the cane. He is more upright walking withotu the cane. With and without the cane decreased stride length and hip flexion.      TODAY'S TREATMENT: 2/28 Manual therapy: trigger point release and roller to the anterior hip; manual anterior hip stretch in side lying; IT band rolling   Chop up x20 green both ways  Chop down x20 both ways  Pallof press x20 bilateral green   Golf swing 1/4 swing  1/2 swing  Full swing x20 each   Step up 2x10 6 inch  Lateral step up 2x15 bilateral    2/22 march with green band 2x15 green  Supine hip abduction 2x15 green  Supine bridge with band 2x15   Step up 6 nch 2x10  Lateral step up 2x10 6 inch  Step down 2 inch 2x10   Manual therapy: trigger point release and roller to the anterior hip; manual anterior hip stretch in side lying; IT band rolling   Tested and reviewed results of strength and motion testing  2/15 Manual therapy: trigger point release and roller to the anterior hip; manual anterior hip stretch in side lying; IT band rolling   Supine   march with green band 2x15 green  Supine hip abduction 2x15 green  Supine bridge with band 2x15   Step up 4 inch 2x10  Lateral step up 2x10 4 inch  Step down 2 inch 2x10    2/7 Manual therapy: trigger point release and roller to the anterior hip; manual anterior hip stretch in side  lying  Supine LTR x20  Supine march 2x10  Bridge with green band 2x10   Leg press 50 lbs 3x10   Cable chop 10;bs 2x10  each direction  Pallof press 2x10 each direction 5 lbs             PATIENT EDUCATION:  Education details: POC going forward  Person educated: Patient Education method: Explanation, Demonstration, Tactile cues, Verbal cues, and Handouts Education comprehension: verbalized understanding, returned demonstration, verbal cues required, tactile cues required, and needs further education     HOME EXERCISE PROGRAM: Access Code: [facility] URL: [email or web address] Date: 08/04/2021 Prepared by: Carolyne Littles   Program Notes Roller to anterior hip        Exercises Supine March - 2 x daily - 7 x weekly - 3 sets - 15 reps Supine Bridge - 2 x daily - 7 x weekly - 3 sets - 10 reps Seated Long Arc Quad - 2 x daily - 7 x weekly - 3 sets - 10 reps     ASSESSMENT:   CLINICAL IMPRESSION: The patient is making excellent progress. He has had minimal pain and the tightness is improving in the front of his hip. Therapy added in a golf swing today. The main concern was his balance. He had no major issues with balance. He showed good technique with his golf swing. He used a 6 iron. He was advised to start with a wedge through 8 iron and work at the range. If he feels good he will work his way down through his clubs. He has no been using the cane. He still has some stiffness when he sits too long and at night but that is improving.    Objective impairments include Abnormal gait, decreased activity tolerance, decreased endurance, decreased mobility, difficulty walking, decreased ROM, decreased strength, increased fascial restrictions, and pain. These impairments are limiting patient from golf shopping, community ambulation . Personal factors including 1 comorbidity: a-fib   are also affecting patient's functional outcome. Patient will benefit from skilled PT to address above impairments and improve overall function.   REHAB POTENTIAL: Excellent   CLINICAL DECISION MAKING:  Stable/uncomplicated   EVALUATION COMPLEXITY: Low     GOALS: Goals reviewed with patient? Yes   SHORT TERM GOALS:   STG Name Target Date Goal status 2/22  1 Patient will report a 50% reduction in pain with activity  Baseline:  08/25/2021 Achieved No pain just tightness   2 Patient will demonstrate full hip ROM without tight feeling at end range  Baseline:  08/25/2021 5 degrees away from 120 degrees of flexion  Progressing    3 Patient will ambulate 500' without a cane  Baseline: 08/25/2021  Ambulating without a cane today Achieved     LONG TERM GOALS:    LTG Name Target Date Goal status 2/22  1 Patient will ambulate in the community without an assistive device  Baseline: 09/15/2021 Progressing; only limited use of his cane   2 Patient will return to golf If cleared by MD  Baseline: 09/15/2021 IProgressing Patient is working on golf activity   3 Patient will go up/down 12 steps without difficulty  Baseline: 09/15/2021 Progressing stairs   PLAN: PT FREQUENCY: 1x/week   PT DURATION: 6 weeks   PLANNED INTERVENTIONS: Therapeutic exercises, Therapeutic activity, Neuro Muscular re-education, Balance training, Gait training, Patient/Family education, Joint mobilization, Stair training, DME instructions, Aquatic Therapy, Dry Needling, Electrical stimulation, Cryotherapy, Moist heat, Taping, Ultrasound, and Manual therapy   PLAN FOR NEXT SESSION:continue with  manual therapy to the hip. Add standing series of exercises; consider step ups;    Carney Living PT DPT  09/21/2021, 3:44 PM

## 2021-09-30 ENCOUNTER — Other Ambulatory Visit: Payer: Self-pay

## 2021-09-30 ENCOUNTER — Ambulatory Visit (HOSPITAL_BASED_OUTPATIENT_CLINIC_OR_DEPARTMENT_OTHER): Payer: Medicare HMO | Attending: Orthopedic Surgery | Admitting: Physical Therapy

## 2021-09-30 DIAGNOSIS — M25651 Stiffness of right hip, not elsewhere classified: Secondary | ICD-10-CM | POA: Insufficient documentation

## 2021-09-30 DIAGNOSIS — R2689 Other abnormalities of gait and mobility: Secondary | ICD-10-CM | POA: Insufficient documentation

## 2021-09-30 DIAGNOSIS — R252 Cramp and spasm: Secondary | ICD-10-CM | POA: Diagnosis not present

## 2021-09-30 NOTE — Therapy (Signed)
OUTPATIENT PHYSICAL THERAPY TREATMENT NOTE   Patient Name: Robert Irwin MRN: [id number] DOB:12/01/1940, 81 y.o., male Today's Date: 10/01/2021  PCP: Wenda Low, MD REFERRING PROVIDER: Wenda Low, MD   PT End of Session - 10/01/21 [phone]     Visit Number 8    Number of Visits 12    Date for PT Re-Evaluation 10/27/21    Authorization Type [employer]    PT Start Time 5400    PT Stop Time 1055    PT Time Calculation (min) 40 min    Activity Tolerance Patient tolerated treatment well    Behavior During Therapy WFL for tasks assessed/performed                 Past Medical History:  Diagnosis Date   A-fib ([place])    Arthritis    Atrial fibrillation ([place])    Diabetes mellitus    Dysrhythmia    ED (erectile dysfunction)    Epistaxis    Hyperlipidemia    Hypertension    Past Surgical History:  Procedure Laterality Date   APPENDECTOMY     NASAL MASS EXCISION     NASAL SINUS SURGERY  12/16/2011   Procedure: ENDOSCOPIC SINUS SURGERY;  Surgeon: Rozetta Nunnery, MD;  Location: [place];  Service: ENT;  Laterality: N/A;  for control of epistaxis   TOTAL HIP ARTHROPLASTY Right 04/29/2021   Procedure: TOTAL HIP ARTHROPLASTY ANTERIOR APPROACH;  Surgeon: Rod Can, MD;  Location: [facility];  Service: Orthopedics;  Laterality: Right;   Patient Active Problem List   Diagnosis Date Noted   Nausea and vomiting 05/07/2021   Weakness 05/07/2021   Dehydration 05/07/2021   Constipation 05/07/2021   Osteoarthritis of right hip 04/29/2021   Encounter for therapeutic drug monitoring 08/22/2013   Atrial fibrillation ([place]) 05/10/2013   Anemia 12/16/2011   Epistaxis 12/14/2011   Hyperlipidemia 12/14/2011   Diabetes mellitus ([place]) 12/14/2011   Diabetes mellitus with coincident hypertension ([place]) 12/14/2011   Atrial fibrillation with RVR ([place]) 12/14/2011  PCP: Wenda Low, MD   REFERRING PROVIDER: Rod Can, MD   REFERRING DIAG: [phone] (ICD-10-CM) - Presence of right  artificial hip joint   THERAPY DIAG:  J09.326 (ICD-10-CM) - Presence of right artificial hip joint   ONSET DATE: 04/29/2021   SUBJECTIVE:    SUBJECTIVE STATEMENT: Patient reports no pain just tightness in the hip.   PERTINENT HISTORY: DMII, Arhtritis in the hand; Afib    PAIN:   No pain just stiffness    PRECAUTIONS: None   WEIGHT BEARING RESTRICTIONS No   FALLS:  Has patient fallen in last 6 months? No, Number of falls: None    LIVING ENVIRONMENT: Has steps in the house> reports he is able to go up them without much difficulty    OCCUPATION:  Retired   Hobbies:  Golf and likes sports    PLOF: [name] to return to golf and an active lifestyle.      OBJECTIVE:      GAIT: Adjusted patients cane 1 nothch higher. He had a lateral lean into the cane. He is more upright walking withotu the cane. With and without the cane decreased stride length and hip flexion.      TODAY'S TREATMENT: 3/9 Manual therapy: trigger point release and roller to the anterior hip; manual anterior hip stretch in side lying; IT band rolling   march with green band 2x15 green  Supine hip abduction 2x15 green  Supine bridge with band 2x15  SLR 2x10 advised not to do if it causes pain   Chop up x20 green both ways  Chop down x20 both ways  Pallof press x20 bilateral green    Step up 2x10 6 inch  Lateral step up 2x15 bilateral    2/28 Manual therapy: trigger point release and roller to the anterior hip; manual anterior hip stretch in side lying; IT band rolling   Chop up x20 green both ways  Chop down x20 both ways  Pallof press x20 bilateral green   Golf swing 1/4 swing  1/2 swing  Full swing x20 each   Step up 2x10 6 inch  Lateral step up 2x15 bilateral    2/22 march with green band 2x15 green  Supine hip abduction 2x15 green  Supine bridge with band 2x15   Step up 6 nch 2x10  Lateral step up 2x10 6 inch  Step down 2 inch 2x10   Manual  therapy: trigger point release and roller to the anterior hip; manual anterior hip stretch in side lying; IT band rolling   Tested and reviewed results of strength and motion testing  2/15 Manual therapy: trigger point release and roller to the anterior hip; manual anterior hip stretch in side lying; IT band rolling   Supine   march with green band 2x15 green  Supine hip abduction 2x15 green  Supine bridge with band 2x15   Step up 4 inch 2x10  Lateral step up 2x10 4 inch  Step down 2 inch 2x10    2/7 Manual therapy: trigger point release and roller to the anterior hip; manual anterior hip stretch in side lying  Supine LTR x20  Supine march 2x10  Bridge with green band 2x10   Leg press 50 lbs 3x10   Cable chop 10;bs 2x10 each direction  Pallof press 2x10 each direction 5 lbs             PATIENT EDUCATION:  Education details: POC going forward  Person educated: Patient Education method: Consulting civil engineer, Demonstration, Tactile cues, Verbal cues, and Handouts Education comprehension: verbalized understanding, returned demonstration, verbal cues required, tactile cues required, and needs further education     HOME EXERCISE PROGRAM: Access Code: [facility] URL: [email or web address] Date: 08/04/2021 Prepared by: Carolyne Littles   Program Notes Roller to anterior hip        Exercises Supine March - 2 x daily - 7 x weekly - 3 sets - 15 reps Supine Bridge - 2 x daily - 7 x weekly - 3 sets - 10 reps Seated Long Arc Quad - 2 x daily - 7 x weekly - 3 sets - 10 reps     ASSESSMENT:   CLINICAL IMPRESSION: The patient continues to progress. We added in a slr today to work on anterior strengthening. He had no significant increase in pain. We continue to work on functional strengthening such as steps and squats. He is not longer using the cane. Therapy will continue to progress as tolerated.   Objective impairments include Abnormal gait, decreased activity  tolerance, decreased endurance, decreased mobility, difficulty walking, decreased ROM, decreased strength, increased fascial restrictions, and pain. These impairments are limiting patient from golf shopping, community ambulation . Personal factors including 1 comorbidity: a-fib   are also affecting patient's functional outcome. Patient will benefit from skilled PT to address above impairments and improve overall function.   REHAB POTENTIAL: Excellent   CLINICAL DECISION MAKING: Stable/uncomplicated   EVALUATION COMPLEXITY: Low     GOALS: Goals reviewed with patient?  Yes   SHORT TERM GOALS:   STG Name Target Date Goal status 2/22  1 Patient will report a 50% reduction in pain with activity  Baseline:  08/25/2021 Achieved No pain just tightness   2 Patient will demonstrate full hip ROM without tight feeling at end range  Baseline:  08/25/2021 5 degrees away from 120 degrees of flexion  Progressing    3 Patient will ambulate 500' without a cane  Baseline: 08/25/2021  Ambulating without a cane today Achieved     LONG TERM GOALS:    LTG Name Target Date Goal status 2/22  1 Patient will ambulate in the community without an assistive device  Baseline: 09/15/2021 Progressing; only limited use of his cane   2 Patient will return to golf If cleared by MD  Baseline: 09/15/2021 IProgressing Patient is working on golf activity   3 Patient will go up/down 12 steps without difficulty  Baseline: 09/15/2021 Progressing stairs   PLAN: PT FREQUENCY: 1x/week   PT DURATION: 6 weeks   PLANNED INTERVENTIONS: Therapeutic exercises, Therapeutic activity, Neuro Muscular re-education, Balance training, Gait training, Patient/Family education, Joint mobilization, Stair training, DME instructions, Aquatic Therapy, Dry Needling, Electrical stimulation, Cryotherapy, Moist heat, Taping, Ultrasound, and Manual therapy   PLAN FOR NEXT SESSION:continue with manual therapy to the hip. Add standing series of  exercises; consider step ups;    Referring diagnosis? W11.914 (ICD-10-CM) - Presence of right artificial hip joint Treatment diagnosis? (if different than referring diagnosis) Z96.641 (ICD-10-CM) - Presence of right artificial hip joint What was this (referring dx) caused by? '[x]'$  Surgery '[]'$  Fall '[]'$  Ongoing issue '[]'$  Arthritis '[]'$  Other: ____________   Laterality: '[x]'$  Rt '[]'$  Lt '[]'$  Both   Check all possible CPT codes:             *CHOOSE 10 OR LESS*                           '[]'$  97110 (Therapeutic Exercise)             '[]'$  92507 (SLP Treatment)          '[]'$  97112 (Neuro Re-ed)                          '[]'$  92526 (Swallowing Treatment)                 '[]'$  97116 (Gait Training)                            '[]'$  [id number] (Cognitive Training, 1st 15 minutes) '[]'$  97140 (Manual Therapy)                     '[]'$  97130 (Cognitive Training, each add'l 15 minutes)     '[]'$  97530 (Therapeutic Activities)            '[]'$  Other, List CPT Code ____________              '[]'$  78295 (Self Care)                                                     '[x]'$  All codes above (97110 - 97535)          '[]'$  97012 (  Mechanical Traction)          '[x]'$  97014 (E-stim Unattended)          '[]'$  97032 (E-stim manual)          '[]'$  97033 (Ionto)          '[x]'$  97035 (Ultrasound)          '[]'$  97760 (Orthotic Fit) '[]'$  [id number] (Physical Performance Training) '[]'$  [id number] (Aquatic Therapy) '[]'$  [id number] (Contrast Bath) '[]'$  [id number] (Paraffin) '[]'$  97597 (Wound Care 1st 20 sq cm) '[]'$  97598 (Wound Care each add'l 20 sq cm) '[]'$  97016 (Vasopneumatic Device) '[]'$  [phone] Comptroller) '[]'$  [phone] (Prosthetic Training)     Carney Living PT DPT  10/01/2021, 9:43 AM

## 2021-10-01 ENCOUNTER — Encounter (HOSPITAL_BASED_OUTPATIENT_CLINIC_OR_DEPARTMENT_OTHER): Payer: Self-pay | Admitting: Physical Therapy

## 2021-10-07 ENCOUNTER — Encounter (HOSPITAL_BASED_OUTPATIENT_CLINIC_OR_DEPARTMENT_OTHER): Payer: Self-pay | Admitting: Physical Therapy

## 2021-10-07 ENCOUNTER — Other Ambulatory Visit: Payer: Self-pay

## 2021-10-07 ENCOUNTER — Ambulatory Visit (HOSPITAL_BASED_OUTPATIENT_CLINIC_OR_DEPARTMENT_OTHER): Payer: Medicare HMO | Admitting: Physical Therapy

## 2021-10-07 DIAGNOSIS — R2689 Other abnormalities of gait and mobility: Secondary | ICD-10-CM | POA: Diagnosis not present

## 2021-10-07 DIAGNOSIS — R252 Cramp and spasm: Secondary | ICD-10-CM | POA: Diagnosis not present

## 2021-10-07 DIAGNOSIS — M25651 Stiffness of right hip, not elsewhere classified: Secondary | ICD-10-CM | POA: Diagnosis not present

## 2021-10-07 NOTE — Therapy (Signed)
?OUTPATIENT PHYSICAL THERAPY TREATMENT NOTE ? ? ?Patient Name: Robert Irwin ?MRN: [id number] ?DOB:03-Mar-1941, 81 y.o., male ?Today's Date: 10/07/2021 ? ?PCP: Wenda Low, MD ?REFERRING PROVIDER: Wenda Low, MD ? ? PT End of Session - 10/07/21 1042   ? ? Visit Number 9   ? Number of Visits 12   ? Date for PT Re-Evaluation 10/27/21   ? Authorization Type [employer]   ? PT Start Time 1015   ? PT Stop Time 1056   ? PT Time Calculation (min) 41 min   ? Activity Tolerance Patient tolerated treatment well   ? Behavior During Therapy [facility] for tasks assessed/performed   ? ?  ?  ? ?  ? ? ? ? ? ? ?Past Medical History:  ?Diagnosis Date  ? A-fib ([place])   ? Arthritis   ? Atrial fibrillation ([place])   ? Diabetes mellitus   ? Dysrhythmia   ? ED (erectile dysfunction)   ? Epistaxis   ? Hyperlipidemia   ? Hypertension   ? ?Past Surgical History:  ?Procedure Laterality Date  ? APPENDECTOMY    ? NASAL MASS EXCISION    ? NASAL SINUS SURGERY  12/16/2011  ? Procedure: ENDOSCOPIC SINUS SURGERY;  Surgeon: Rozetta Nunnery, MD;  Location: [place];  Service: ENT;  Laterality: N/A;  for control of epistaxis  ? TOTAL HIP ARTHROPLASTY Right 04/29/2021  ? Procedure: TOTAL HIP ARTHROPLASTY ANTERIOR APPROACH;  Surgeon: Rod Can, MD;  Location: [facility];  Service: Orthopedics;  Laterality: Right;  ? ?Patient Active Problem List  ? Diagnosis Date Noted  ? Nausea and vomiting 05/07/2021  ? Weakness 05/07/2021  ? Dehydration 05/07/2021  ? Constipation 05/07/2021  ? Osteoarthritis of right hip 04/29/2021  ? Encounter for therapeutic drug monitoring 08/22/2013  ? Atrial fibrillation ([place]) 05/10/2013  ? Anemia 12/16/2011  ? Epistaxis 12/14/2011  ? Hyperlipidemia 12/14/2011  ? Diabetes mellitus ([place]) 12/14/2011  ? Diabetes mellitus with coincident hypertension ([place]) 12/14/2011  ? Atrial fibrillation with RVR ([place]) 12/14/2011  ?PCP: Wenda Low, MD ?  ?REFERRING PROVIDER: Rod Can, MD ?  ?REFERRING DIAG: M35.597 (ICD-10-CM) - Presence of right  artificial hip joint ?  ?THERAPY DIAG:  ?Z96.641 (ICD-10-CM) - Presence of right artificial hip joint ?  ?ONSET DATE: 04/29/2021 ?  ?SUBJECTIVE:  ?  ?SUBJECTIVE STATEMENT: ?Patient reports no pain just tightness in the hip. He has been working on his exercises ? ?PERTINENT HISTORY: ?DMII, Arhtritis in the hand; Afib  ?  ?PAIN:  ? No pain just stiffness  ?  ?PRECAUTIONS: None ?  ?WEIGHT BEARING RESTRICTIONS No ?  ?FALLS:  ?Has patient fallen in last 6 months? No, Number of falls: None  ?  ?LIVING ENVIRONMENT: ?Has steps in the house> reports he is able to go up them without much difficulty  ?  ?OCCUPATION:  ?Retired ?  ?Hobbies:  ?Golf and likes sports  ?  ?PLOF: Independent ?  ?PATIENT GOALS to return to golf and an active lifestyle.  ?  ?  ?OBJECTIVE:  ?  ?  ?GAIT: ?Adjusted patients cane 1 nothch higher. He had a lateral lean into the cane. He is more upright walking withotu the cane. With and without the cane decreased stride length and hip flexion.  ?  ?  ?TODAY'S TREATMENT: ?3/16 ? ?Manual therapy: trigger point release and roller to the anterior hip; manual anterior hip stretch in side lying; IT band rolling  ? ? ?SLR 2x10 advised not to do if it causes pain  ?  march with green band 2x15 Blue  ?Supine hip abduction 2x15 Blue  ?Supine bridge with band 2x15  Blue  ? ?Leg press x30 50 lbs  ?Knee extension 3x10 20 lbs  ?Hip abdcution machine 3x10 55 lbs  ? ? ?3/9 ?Manual therapy: trigger point release and roller to the anterior hip; manual anterior hip stretch in side lying; IT band rolling  ? ?march with green band 2x15 green  ?Supine hip abduction 2x15 green  ?Supine bridge with band 2x15  ? ?SLR 2x10 advised not to do if it causes pain  ? ?Chop up x20 green both ways  ?Chop down x20 both ways  ?Pallof press x20 bilateral green  ? ? ?Step up 2x10 6 inch  ?Lateral step up 2x15 bilateral ? ? ? ?2/28 ?Manual therapy: trigger point release and roller to the anterior hip; manual anterior hip stretch in side lying; IT  band rolling  ? ?Chop up x20 green both ways  ?Chop down x20 both ways  ?Pallof press x20 bilateral green  ? ?Golf swing 1/4 swing  ?1/2 swing  ?Full swing x20 each  ? ?Step up 2x10 6 inch  ?Lateral step up 2x15 bilateral ? ? ? ?2/22 ?march with green band 2x15 green  ?Supine hip abduction 2x15 green  ?Supine bridge with band 2x15  ? ?Step up 6 nch 2x10  ?Lateral step up 2x10 6 inch  ?Step down 2 inch 2x10  ? ?Manual therapy: trigger point release and roller to the anterior hip; manual anterior hip stretch in side lying; IT band rolling  ? ?Tested and reviewed results of strength and motion testing  ?2/15 ?Manual therapy: trigger point release and roller to the anterior hip; manual anterior hip stretch in side lying; IT band rolling  ? ?Supine ? ? march with green band 2x15 green  ?Supine hip abduction 2x15 green  ?Supine bridge with band 2x15  ? ?Step up 4 inch 2x10  ?Lateral step up 2x10 4 inch  ?Step down 2 inch 2x10  ? ? ?2/7 ?Manual therapy: trigger point release and roller to the anterior hip; manual anterior hip stretch in side lying ? ?Supine LTR x20  ?Supine march 2x10  ?Bridge with green band 2x10  ? ?Leg press 50 lbs 3x10  ? ?Cable chop 10;bs 2x10 each direction  ?Pallof press 2x10 each direction 5 lbs  ? ? ? ? ? ? ? ?  ?  ?PATIENT EDUCATION:  ?Education details: POC going forward  ?Person educated: Patient ?Education method: Explanation, Demonstration, Tactile cues, Verbal cues, and Handouts ?Education comprehension: verbalized understanding, returned demonstration, verbal cues required, tactile cues required, and needs further education ?  ?  ?HOME EXERCISE PROGRAM: ?Access Code: [email or web address] ?URL: [email or web address] ?Date: 08/04/2021 ?Prepared by: Carolyne Littles ?  ?Program Notes ?Roller to anterior hip  ?  ?  ?  ?Exercises ?Supine March - 2 x daily - 7 x weekly - 3 sets - 15 reps ?Supine Bridge - 2 x daily - 7 x weekly - 3 sets - 10 reps ?Seated Long Arc Quad - 2 x daily - 7 x weekly - 3  sets - 10 reps ?  ?  ?ASSESSMENT: ?  ?CLINICAL IMPRESSION: ?Therapy added in gym exercises with the patient. He tolerated well. He reported no increase in pain. We also advanced his band to blue. His anterior hip tightness has improved significantly. Therapy will continue to progress strengthening exercises and return to golf exercises as tolerate.d  ? ?Objective impairments include Abnormal  gait, decreased activity tolerance, decreased endurance, decreased mobility, difficulty walking, decreased ROM, decreased strength, increased fascial restrictions, and pain. These impairments are limiting patient from golf shopping, community ambulation . Personal factors including 1 comorbidity: a-fib   are also affecting patient's functional outcome. Patient will benefit from skilled PT to address above impairments and improve overall function. ?  ?REHAB POTENTIAL: Excellent ?  ?CLINICAL DECISION MAKING: Stable/uncomplicated ?  ?EVALUATION COMPLEXITY: Low ?  ?  ?GOALS: ?Goals reviewed with patient? Yes ?  ?SHORT TERM GOALS: ?  ?STG Name Target Date Goal status ?2/22  ?1 Patient will report a 50% reduction in pain with activity  ?Baseline:  08/25/2021 Achieved No pain just tightness   ?2 Patient will demonstrate full hip ROM without tight feeling at end range  ?Baseline:  08/25/2021 5 degrees away from 120 degrees of flexion  ?Progressing    ?3 Patient will ambulate 500' without a cane  ?Baseline: 08/25/2021  ?Ambulating without a cane today ?Achieved   ?  ?LONG TERM GOALS:  ?  ?LTG Name Target Date Goal status ?2/22  ?1 Patient will ambulate in the community without an assistive device  ?Baseline: 09/15/2021 Progressing; only limited use of his cane   ?2 Patient will return to golf If cleared by MD  ?Baseline: 09/15/2021 IProgressing ?Patient is working on golf activity   ?3 Patient will go up/down 12 steps without difficulty  ?Baseline: 09/15/2021 Progressing stairs   ?PLAN: ?PT FREQUENCY: 1x/week ?  ?PT DURATION: 6 weeks ?  ?PLANNED  INTERVENTIONS: Therapeutic exercises, Therapeutic activity, Neuro Muscular re-education, Balance training, Gait training, Patient/Family education, Joint mobilization, Stair training, DME instructions, A

## 2021-10-13 DIAGNOSIS — I10 Essential (primary) hypertension: Secondary | ICD-10-CM | POA: Diagnosis not present

## 2021-10-13 DIAGNOSIS — E1169 Type 2 diabetes mellitus with other specified complication: Secondary | ICD-10-CM | POA: Diagnosis not present

## 2021-10-13 DIAGNOSIS — Z1389 Encounter for screening for other disorder: Secondary | ICD-10-CM | POA: Diagnosis not present

## 2021-10-13 DIAGNOSIS — M1990 Unspecified osteoarthritis, unspecified site: Secondary | ICD-10-CM | POA: Diagnosis not present

## 2021-10-13 DIAGNOSIS — M109 Gout, unspecified: Secondary | ICD-10-CM | POA: Diagnosis not present

## 2021-10-13 DIAGNOSIS — D6869 Other thrombophilia: Secondary | ICD-10-CM | POA: Diagnosis not present

## 2021-10-13 DIAGNOSIS — N400 Enlarged prostate without lower urinary tract symptoms: Secondary | ICD-10-CM | POA: Diagnosis not present

## 2021-10-13 DIAGNOSIS — I482 Chronic atrial fibrillation, unspecified: Secondary | ICD-10-CM | POA: Diagnosis not present

## 2021-10-13 DIAGNOSIS — Z0000 Encounter for general adult medical examination without abnormal findings: Secondary | ICD-10-CM | POA: Diagnosis not present

## 2021-10-13 DIAGNOSIS — E782 Mixed hyperlipidemia: Secondary | ICD-10-CM | POA: Diagnosis not present

## 2021-10-14 ENCOUNTER — Other Ambulatory Visit: Payer: Self-pay

## 2021-10-14 ENCOUNTER — Ambulatory Visit (HOSPITAL_BASED_OUTPATIENT_CLINIC_OR_DEPARTMENT_OTHER): Payer: Medicare HMO | Admitting: Physical Therapy

## 2021-10-14 DIAGNOSIS — R2689 Other abnormalities of gait and mobility: Secondary | ICD-10-CM

## 2021-10-14 DIAGNOSIS — M25651 Stiffness of right hip, not elsewhere classified: Secondary | ICD-10-CM

## 2021-10-14 DIAGNOSIS — R252 Cramp and spasm: Secondary | ICD-10-CM | POA: Diagnosis not present

## 2021-10-14 NOTE — Therapy (Signed)
?OUTPATIENT PHYSICAL THERAPY TREATMENT NOTE ? ? ?Patient Name: Robert Irwin ?MRN: [id number] ?DOB:February 21, 1941, 81 y.o., male ?Today's Date: 10/15/2021 ? ?PCP: Wenda Low, MD ?REFERRING PROVIDER: Wenda Low, MD ? ?Progress Note ?Reporting Period 08/03/2021 to 10/15/2021 ? ?See note below for Objective Data and Assessment of Progress/Goals.  ? ?  ? ? PT End of Session - 10/14/21 1027   ? ? Visit Number 10   ? Number of Visits 12   ? Date for PT Re-Evaluation 10/27/21   ? Authorization Type [employer]   ? PT Start Time 1015   ? PT Stop Time 1057   ? PT Time Calculation (min) 42 min   ? Activity Tolerance Patient tolerated treatment well   ? Behavior During Therapy [facility] for tasks assessed/performed   ? ?  ?  ? ?  ? ?  ? ? ? ?Past Medical History:  ?Diagnosis Date  ? A-fib ([place])   ? Arthritis   ? Atrial fibrillation ([place])   ? Diabetes mellitus   ? Dysrhythmia   ? ED (erectile dysfunction)   ? Epistaxis   ? Hyperlipidemia   ? Hypertension   ? ?Past Surgical History:  ?Procedure Laterality Date  ? APPENDECTOMY    ? NASAL MASS EXCISION    ? NASAL SINUS SURGERY  12/16/2011  ? Procedure: ENDOSCOPIC SINUS SURGERY;  Surgeon: Rozetta Nunnery, MD;  Location: [place];  Service: ENT;  Laterality: N/A;  for control of epistaxis  ? TOTAL HIP ARTHROPLASTY Right 04/29/2021  ? Procedure: TOTAL HIP ARTHROPLASTY ANTERIOR APPROACH;  Surgeon: Rod Can, MD;  [facility];  Service: Orthopedics;  Laterality: Right;  ? ?Patient Active Problem List  ? Diagnosis Date Noted  ? Nausea and vomiting 05/07/2021  ? Weakness 05/07/2021  ? Dehydration 05/07/2021  ? Constipation 05/07/2021  ? Osteoarthritis of right hip 04/29/2021  ? Encounter for therapeutic drug monitoring 08/22/2013  ? Atrial fibrillation ([place]) 05/10/2013  ? Anemia 12/16/2011  ? Epistaxis 12/14/2011  ? Hyperlipidemia 12/14/2011  ? Diabetes mellitus ([place]) 12/14/2011  ? Diabetes mellitus with coincident hypertension ([place]) 12/14/2011  ? Atrial fibrillation with RVR ([place])  12/14/2011  ?PCP: Wenda Low, MD ?  ?REFERRING PROVIDER: Rod Can, MD ?  ?REFERRING DIAG: P82.423 (ICD-10-CM) - Presence of right artificial hip joint ?  ?THERAPY DIAG:  ?Z96.641 (ICD-10-CM) - Presence of right artificial hip joint ?  ?ONSET DATE: 04/29/2021 ?  ?SUBJECTIVE:  ?  ?SUBJECTIVE STATEMENT: ?Patient reports no pain just tightness in the hip. He has been working on his exercises ? ?PERTINENT HISTORY: ?DMII, Arhtritis in the hand; Afib  ?  ?PAIN:  ? No pain just stiffness  ?  ?PRECAUTIONS: None ?  ?WEIGHT BEARING RESTRICTIONS No ?  ?FALLS:  ?Has patient fallen in last 6 months? No, Number of falls: None  ?  ?LIVING ENVIRONMENT: ?Has steps in the house> reports he is able to go up them without much difficulty  ?  ?OCCUPATION:  ?Retired ?  ?Hobbies:  ?Golf and likes sports  ?  ?PLOF: Independent ?  ?PATIENT GOALS to return to golf and an active lifestyle.  ?  ?  ?OBJECTIVE:  ? ?LE AROM/PROM: ?  ?PROM Right ?08/03/2021 Left ?08/03/2021  ?Hip flexion 110 degrees     ?Hip extension      ?Hip abduction      ?Hip adduction      ?Hip internal rotation No significant limitation     ?Hip external rotation No significant limitation     ?  Knee flexion      ?Knee extension      ?Ankle dorsiflexion      ?Ankle plantarflexion      ?Ankle inversion      ?Ankle eversion      ? (Blank rows = not tested) ?  ?LE MMT: ?  ?MMT Right ?08/03/2021 Left ?08/03/2021  ?Hip flexion 14.9 ? ?3/23 ?34.4 22.9 ? ?3/23 ?40.6  ?Hip extension      ?Hip abduction 31.2 ? ?3/23 ?54.4 33.3 ? ?3/23 ?53.5  ?Hip adduction      ?Hip internal rotation      ?Hip external rotation      ?Knee flexion      ?Knee extension 39.8 ? ?3/23 ?53.6 50.3 ? ?3/23 ?61.2  ?Ankle dorsiflexion      ?Ankle plantarflexion      ?Ankle inversion      ?Ankle eversion      ? (Blank rows = not tested) ?  ? ?  ?  ?GAIT: ?Adjusted patients cane 1 nothch higher. He had a lateral lean into the cane. He is more upright walking withotu the cane. With and without the cane  decreased stride length and hip flexion.  ?  ?  ?TODAY'S TREATMENT: ?3/23 ?Reviewed strength measurements and goals  ? ? ?Leg press x30 50 lbs  ?Knee extension 3x10 20 lbs  ?Hip abdcution machine 3x10 55 lbs  ? ?Step up 6 inch x20  ?Lateral Step 6 inch x20  ?Step down 4 inch x20  ? ? ? ? ?3/16 ? ?Manual therapy: trigger point release and roller to the anterior hip; manual anterior hip stretch in side lying; IT band rolling  ? ? ?SLR 2x10 advised not to do if it causes pain  ?march with green band 2x15 Blue  ?Supine hip abduction 2x15 Blue  ?Supine bridge with band 2x15  Blue  ? ?Leg press x30 50 lbs  ?Knee extension 3x10 20 lbs  ?Hip abdcution machine 3x10 55 lbs  ? ? ?3/9 ?Manual therapy: trigger point release and roller to the anterior hip; manual anterior hip stretch in side lying; IT band rolling  ? ?march with green band 2x15 green  ?Supine hip abduction 2x15 green  ?Supine bridge with band 2x15  ? ?SLR 2x10 advised not to do if it causes pain  ? ?Chop up x20 green both ways  ?Chop down x20 both ways  ?Pallof press x20 bilateral green  ? ? ?Step up 2x10 6 inch  ?Lateral step up 2x15 bilateral ? ? ? ?2/28 ?Manual therapy: trigger point release and roller to the anterior hip; manual anterior hip stretch in side lying; IT band rolling  ? ?Chop up x20 green both ways  ?Chop down x20 both ways  ?Pallof press x20 bilateral green  ? ?Golf swing 1/4 swing  ?1/2 swing  ?Full swing x20 each  ? ?Step up 2x10 6 inch  ?Lateral step up 2x15 bilateral ? ? ? ? ? ? ? ? ? ?  ?  ?PATIENT EDUCATION:  ?Education details: POC going forward  ?Person educated: Patient ?Education method: Explanation, Demonstration, Tactile cues, Verbal cues, and Handouts ?Education comprehension: verbalized understanding, returned demonstration, verbal cues required, tactile cues required, and needs further education ?  ?  ?HOME EXERCISE PROGRAM: ?Access Code: [email or web address] ?URL: [email or web address] ?Date: 08/04/2021 ?Prepared by: Carolyne Littles ?  ?Program Notes ?Roller to anterior hip  ?  ?  ?  ?Exercises ?Supine March - 2 x daily - 7 x weekly -  3 sets - 15 reps ?Supine Bridge - 2 x daily - 7 x weekly - 3 sets - 10 reps ?Seated Long Arc Quad - 2 x daily - 7 x weekly - 3 sets - 10 reps ?  ?  ?ASSESSMENT: ?  ?CLINICAL IMPRESSION: ?The patient has made great progress. He is having very little pain. He is concerned about tightness in the hip joint itself. He feels like the rest of the tightness has resolved. We assessed his strength and motion today. His motion on the right is equal to his left. His overall goal is to return to golf. He may wait until he sees the MD again At this point he is only having mild tightness in his hip. He is concerned about the tightness but it does not seem to be anything he needs to be concerned about. We will re-cert him 2W4 but may discharge next visit if he is comfortable.  ? ?Objective impairments include Abnormal gait, decreased activity tolerance, decreased endurance, decreased mobility, difficulty walking, decreased ROM, decreased strength, increased fascial restrictions, and pain. These impairments are limiting patient from golf shopping, community ambulation . Personal factors including 1 comorbidity: a-fib   are also affecting patient's functional outcome. Patient will benefit from skilled PT to address above impairments and improve overall function. ?  ?REHAB POTENTIAL: Excellent ?  ?CLINICAL DECISION MAKING: Stable/uncomplicated ?  ?EVALUATION COMPLEXITY: Low ?  ?  ?GOALS: ?Goals reviewed with patient? Yes ?  ?SHORT TERM GOALS: ?  ?STG Name Target Date Goal status ?3/23  ?1 Patient will report a 50% reduction in pain with activity  ?Baseline:  08/25/2021 Achieved No pain just tightness   ?2 Patient will demonstrate full hip ROM without tight feeling at end range  ?Baseline:  08/25/2021 Full extension and flexion  ?Achived     ?3 Patient will ambulate 500' without a cane  ?Baseline: 08/25/2021  ?Ambulating without a  cane today ?Achieved   ?  ?LONG TERM GOALS:  ?  ?LTG Name Target Date Goal status ?2/22  ?1 Patient will ambulate in the community without an assistive device  ?Baseline: 09/15/2021 Achieved. He is no longer using a can

## 2021-10-15 ENCOUNTER — Encounter (HOSPITAL_BASED_OUTPATIENT_CLINIC_OR_DEPARTMENT_OTHER): Payer: Self-pay | Admitting: Physical Therapy

## 2021-10-21 ENCOUNTER — Encounter (HOSPITAL_BASED_OUTPATIENT_CLINIC_OR_DEPARTMENT_OTHER): Payer: Self-pay | Admitting: Physical Therapy

## 2021-10-21 ENCOUNTER — Ambulatory Visit (HOSPITAL_BASED_OUTPATIENT_CLINIC_OR_DEPARTMENT_OTHER): Payer: Medicare HMO | Admitting: Physical Therapy

## 2021-10-21 DIAGNOSIS — R252 Cramp and spasm: Secondary | ICD-10-CM

## 2021-10-21 DIAGNOSIS — M25651 Stiffness of right hip, not elsewhere classified: Secondary | ICD-10-CM

## 2021-10-21 DIAGNOSIS — R2689 Other abnormalities of gait and mobility: Secondary | ICD-10-CM | POA: Diagnosis not present

## 2021-10-21 NOTE — Therapy (Signed)
?OUTPATIENT PHYSICAL THERAPY TREATMENT NOTE/Discharge  ? ? ?Patient Name: Robert Irwin ?MRN: [id number] ?DOB:13-Jun-1941, 81 y.o., male ?Today's Date: 10/21/2021 ? ?PCP: Wenda Low, MD ?REFERRING PROVIDER: Wenda Low, MD ? ?Progress Note ?Reporting Period 08/03/2021 to 10/15/2021 ? ?See note below for Objective Data and Assessment of Progress/Goals.  ? ?  ? ? PT End of Session - 10/21/21 1016   ? ? Visit Number 11   ? Number of Visits 12   ? Date for PT Re-Evaluation 11/12/21   ? Authorization Type [employer]   ? PT Start Time 1011   ? PT Stop Time 1054   ? PT Time Calculation (min) 43 min   ? Activity Tolerance Patient tolerated treatment well   ? Behavior During Therapy [facility] for tasks assessed/performed   ? ?  ?  ? ?  ? ?  ? ? ? ?Past Medical History:  ?Diagnosis Date  ? A-fib ([place])   ? Arthritis   ? Atrial fibrillation ([place])   ? Diabetes mellitus   ? Dysrhythmia   ? ED (erectile dysfunction)   ? Epistaxis   ? Hyperlipidemia   ? Hypertension   ? ?Past Surgical History:  ?Procedure Laterality Date  ? APPENDECTOMY    ? NASAL MASS EXCISION    ? NASAL SINUS SURGERY  12/16/2011  ? Procedure: ENDOSCOPIC SINUS SURGERY;  Surgeon: Rozetta Nunnery, MD;  Location: [place];  Service: ENT;  Laterality: N/A;  for control of epistaxis  ? TOTAL HIP ARTHROPLASTY Right 04/29/2021  ? Procedure: TOTAL HIP ARTHROPLASTY ANTERIOR APPROACH;  Surgeon: Rod Can, MD;  [facility];  Service: Orthopedics;  Laterality: Right;  ? ?Patient Active Problem List  ? Diagnosis Date Noted  ? Nausea and vomiting 05/07/2021  ? Weakness 05/07/2021  ? Dehydration 05/07/2021  ? Constipation 05/07/2021  ? Osteoarthritis of right hip 04/29/2021  ? Encounter for therapeutic drug monitoring 08/22/2013  ? Atrial fibrillation ([place]) 05/10/2013  ? Anemia 12/16/2011  ? Epistaxis 12/14/2011  ? Hyperlipidemia 12/14/2011  ? Diabetes mellitus ([place]) 12/14/2011  ? Diabetes mellitus with coincident hypertension ([place]) 12/14/2011  ? Atrial fibrillation with RVR  ([place]) 12/14/2011  ?PCP: Wenda Low, MD ?  ?REFERRING PROVIDER: Rod Can, MD ?  ?REFERRING DIAG: U82.800 (ICD-10-CM) - Presence of right artificial hip joint ?  ?THERAPY DIAG:  ?Z96.641 (ICD-10-CM) - Presence of right artificial hip joint ?  ?ONSET DATE: 04/29/2021 ?  ?SUBJECTIVE:  ?  ?SUBJECTIVE STATEMENT: ?Patient reports no pain just tightness in the hip. He has been working on his exercises ? ?PERTINENT HISTORY: ?DMII, Arhtritis in the hand; Afib  ?  ?PAIN:  ? No pain just stiffness  ?  ?PRECAUTIONS: None ?  ?WEIGHT BEARING RESTRICTIONS No ?  ?FALLS:  ?Has patient fallen in last 6 months? No, Number of falls: None  ?  ?LIVING ENVIRONMENT: ?Has steps in the house> reports he is able to go up them without much difficulty  ?  ?OCCUPATION:  ?Retired ?  ?Hobbies:  ?Golf and likes sports  ?  ?PLOF: Independent ?  ?PATIENT GOALS to return to golf and an active lifestyle.  ?  ?  ?OBJECTIVE:  ? ?LE AROM/PROM: ?  ?PROM Right ?08/03/2021 Left ?08/03/2021  ?Hip flexion 110 degrees     ?Hip extension      ?Hip abduction      ?Hip adduction      ?Hip internal rotation No significant limitation     ?Hip external rotation No significant limitation     ?  Knee flexion      ?Knee extension      ?Ankle dorsiflexion      ?Ankle plantarflexion      ?Ankle inversion      ?Ankle eversion      ? (Blank rows = not tested) ?  ?LE MMT: ?  ?MMT Right ?08/03/2021 Left ?08/03/2021  ?Hip flexion 14.9 ? ?3/23 ?34.4 22.9 ? ?3/23 ?40.6  ?Hip extension      ?Hip abduction 31.2 ? ?3/23 ?54.4 33.3 ? ?3/23 ?53.5  ?Hip adduction      ?Hip internal rotation      ?Hip external rotation      ?Knee flexion      ?Knee extension 39.8 ? ?3/23 ?53.6 50.3 ? ?3/23 ?61.2  ?Ankle dorsiflexion      ?Ankle plantarflexion      ?Ankle inversion      ?Ankle eversion      ? (Blank rows = not tested) ?  ? ?  ?  ?GAIT: ?Adjusted patients cane 1 nothch higher. He had a lateral lean into the cane. He is more upright walking withotu the cane. With and without the cane  decreased stride length and hip flexion.  ?  ?  ?TODAY'S TREATMENT: ?3/23 ?Reviewed strength measurements and goals  ? ? ?Leg press x30 50 lbs  ?Knee extension 3x10 20 lbs  ?Hip abdcution machine 3x10 55 lbs  ? ?Step up 6 inch x20  ?Lateral Step 6 inch x20  ?Step down 4 inch x20  ? ? ? ? ?3/16 ? ?Manual therapy: trigger point release and roller to the anterior hip; manual anterior hip stretch in side lying; IT band rolling  ? ? ?SLR 2x10 advised not to do if it causes pain  ?march with green band 2x15 Blue  ?Supine hip abduction 2x15 Blue  ?Supine bridge with band 2x15  Blue  ? ?Leg press x30 50 lbs  ?Knee extension 3x10 20 lbs  ?Hip abdcution machine 3x10 55 lbs  ? ? ?3/9 ?Manual therapy: trigger point release and roller to the anterior hip; manual anterior hip stretch in side lying; IT band rolling  ? ?march with green band 2x15 green  ?Supine hip abduction 2x15 green  ?Supine bridge with band 2x15  ? ?SLR 2x10 advised not to do if it causes pain  ? ?Chop up x20 green both ways  ?Chop down x20 both ways  ?Pallof press x20 bilateral green  ? ? ?Step up 2x10 6 inch  ?Lateral step up 2x15 bilateral ? ? ? ?2/28 ?Manual therapy: trigger point release and roller to the anterior hip; manual anterior hip stretch in side lying; IT band rolling  ? ?Chop up x20 green both ways  ?Chop down x20 both ways  ?Pallof press x20 bilateral green  ? ?Golf swing 1/4 swing  ?1/2 swing  ?Full swing x20 each  ? ?Step up 2x10 6 inch  ?Lateral step up 2x15 bilateral ? ? ? ? ? ? ? ? ? ?  ?  ?PATIENT EDUCATION:  ?Education details: POC going forward  ?Person educated: Patient ?Education method: Explanation, Demonstration, Tactile cues, Verbal cues, and Handouts ?Education comprehension: verbalized understanding, returned demonstration, verbal cues required, tactile cues required, and needs further education ?  ?  ?HOME EXERCISE PROGRAM: ?Access Code: [email or web address] ?URL: [email or web address] ?Date: 08/04/2021 ?Prepared by: Carolyne Littles ?  ?Program Notes ?Roller to anterior hip  ?  ?  ?  ?Exercises ?Supine March - 2 x daily - 7 x weekly -  3 sets - 15 reps ?Supine Bridge - 2 x daily - 7 x weekly - 3 sets - 10 reps ?Seated Long Arc Quad - 2 x daily - 7 x weekly - 3 sets - 10 reps ?  ?  ?ASSESSMENT: ?  ?CLINICAL IMPRESSION: ?The patient has made great progress. He is having very little pain. He is concerned about tightness in the hip joint itself. He feels like the rest of the tightness has resolved. We assessed his strength and motion today. His motion on the right is equal to his left. His overall goal is to return to golf. He may wait until he sees the MD again At this point he is only having mild tightness in his hip. He is concerned about the tightness but it does not seem to be anything he needs to be concerned about. We will re-cert him 2W4 but may discharge next visit if he is comfortable.  ? ?Objective impairments include Abnormal gait, decreased activity tolerance, decreased endurance, decreased mobility, difficulty walking, decreased ROM, decreased strength, increased fascial restrictions, and pain. These impairments are limiting patient from golf shopping, community ambulation . Personal factors including 1 comorbidity: a-fib   are also affecting patient's functional outcome. Patient will benefit from skilled PT to address above impairments and improve overall function. ?  ?REHAB POTENTIAL: Excellent ?  ?CLINICAL DECISION MAKING: Stable/uncomplicated ?  ?EVALUATION COMPLEXITY: Low ?  ?  ?GOALS: ?Goals reviewed with patient? Yes ?  ?SHORT TERM GOALS: ?  ?STG Name Target Date Goal status ?3/31  ?1 Patient will report a 50% reduction in pain with activity  ?Baseline:  08/25/2021 Achieved No pain just tightness   ?2 Patient will demonstrate full hip ROM without tight feeling at end range  ?Baseline:  08/25/2021 Full extension and flexion  ?Achived     ?3 Patient will ambulate 500' without a cane  ?Baseline: 08/25/2021  ?Ambulating without a  cane today ?Achieved   ?  ?LONG TERM GOALS:  ?  ?LTG Name Target Date Goal status ?3/31  ?1 Patient will ambulate in the community without an assistive device  ?Baseline: 09/15/2021 Achieved. He is no longer

## 2021-10-22 ENCOUNTER — Encounter (HOSPITAL_BASED_OUTPATIENT_CLINIC_OR_DEPARTMENT_OTHER): Payer: Self-pay | Admitting: Physical Therapy

## 2021-10-26 DIAGNOSIS — Z96641 Presence of right artificial hip joint: Secondary | ICD-10-CM | POA: Diagnosis not present

## 2021-11-12 DIAGNOSIS — E782 Mixed hyperlipidemia: Secondary | ICD-10-CM | POA: Diagnosis not present

## 2021-11-12 DIAGNOSIS — E1169 Type 2 diabetes mellitus with other specified complication: Secondary | ICD-10-CM | POA: Diagnosis not present

## 2021-11-12 DIAGNOSIS — M1990 Unspecified osteoarthritis, unspecified site: Secondary | ICD-10-CM | POA: Diagnosis not present

## 2021-11-12 DIAGNOSIS — I10 Essential (primary) hypertension: Secondary | ICD-10-CM | POA: Diagnosis not present

## 2021-12-22 ENCOUNTER — Other Ambulatory Visit: Payer: Self-pay | Admitting: Cardiology

## 2021-12-22 NOTE — Telephone Encounter (Signed)
SCr 0.64 on 04/2021, age 81, weight 152 lbs, CrCl 90 Afib indication, last OV 01/2021

## 2022-02-14 ENCOUNTER — Encounter: Payer: Self-pay | Admitting: Cardiology

## 2022-02-14 ENCOUNTER — Ambulatory Visit: Payer: Medicare HMO | Admitting: Cardiology

## 2022-02-14 VITALS — BP 110/60 | HR 79 | Ht 67.0 in | Wt 150.0 lb

## 2022-02-14 DIAGNOSIS — E785 Hyperlipidemia, unspecified: Secondary | ICD-10-CM | POA: Diagnosis not present

## 2022-02-14 DIAGNOSIS — I4821 Permanent atrial fibrillation: Secondary | ICD-10-CM | POA: Diagnosis not present

## 2022-02-14 DIAGNOSIS — I10 Essential (primary) hypertension: Secondary | ICD-10-CM

## 2022-02-14 DIAGNOSIS — E119 Type 2 diabetes mellitus without complications: Secondary | ICD-10-CM | POA: Diagnosis not present

## 2022-02-14 NOTE — Progress Notes (Signed)
Cardiology Office Note:    Date:  02/14/2022   ID:  Robert Irwin, DOB [birth year], MRN [id number]  PCP:  Wenda Low, MD   [facility] Providers Cardiologist:  Candee Furbish, MD     Referring MD: Wenda Low, MD    History of Present Illness:    Robert Irwin is a 81 y.o. male here for the follow-up of permanent atrial fibrillation which we have been seeing for years.  Doing very well with rate control.  Denies any fevers chills nausea vomiting.  No bleeding.  Stopped consuming alcohol approximately 5 years ago.  Has done well.  Rare gouty flare.  Enjoys golf but hip hurts after 3 holes.   Hip replaced right 2023.   Past Medical History:  Diagnosis Date   A-fib [facility])    Arthritis    Atrial fibrillation ([place])    Diabetes mellitus    Dysrhythmia    ED (erectile dysfunction)    Epistaxis    Hyperlipidemia    Hypertension     Past Surgical History:  Procedure Laterality Date   APPENDECTOMY     NASAL MASS EXCISION     NASAL SINUS SURGERY  12/16/2011   Procedure: ENDOSCOPIC SINUS SURGERY;  Surgeon: Rozetta Nunnery, MD;  Location: [place];  Service: ENT;  Laterality: N/A;  for control of epistaxis   TOTAL HIP ARTHROPLASTY Right 04/29/2021   Procedure: TOTAL HIP ARTHROPLASTY ANTERIOR APPROACH;  Surgeon: Rod Can, MD;  [facility];  Service: Orthopedics;  Laterality: Right;    Current Medications: Current Meds  Medication Sig   acetaminophen (TYLENOL) 325 MG tablet Take 2 tablets (650 mg total) by mouth every 6 (six) hours as needed for mild pain (or Fever >/= 101).   atorvastatin (LIPITOR) 20 MG tablet Take 1 tablet (20 mg total) by mouth daily.   calcium citrate-vitamin D 200-200 MG-UNIT TABS Take 1 tablet by mouth daily.   glimepiride (AMARYL) 2 MG tablet Take 2 mg by mouth daily before breakfast.   Glucosamine-Chondroitin (OSTEO BI-FLEX REGULAR STRENGTH PO) Take 1 tablet by mouth 2 (two) times daily.   Multiple Vitamin (MULITIVITAMIN WITH MINERALS) TABS  Take 1 tablet by mouth daily.   prochlorperazine (COMPAZINE) 10 MG tablet Take 1 tablet (10 mg total) by mouth every 6 (six) hours as needed for nausea or vomiting.   rivaroxaban (XARELTO) 20 MG TABS tablet Take 1 tablet (20 mg total) by mouth daily with supper.   TIADYLT ER 240 MG 24 hr capsule TAKE 1 CAPSULE EVERY DAY (PLEASE KEEP UPCOMMING APPOINTMENT IN JULY 2022 FOR FUTURE REFILLS. THANK YOU)   traMADol (ULTRAM) 50 MG tablet Take 1 tablet (50 mg total) by mouth every 6 (six) hours as needed for moderate pain.     Allergies:   Patient has no known allergies.   Social History   Socioeconomic History   Marital status: Married    Spouse name: Not on file   Number of children: Not on file   Years of education: Not on file   Highest education level: Not on file  Occupational History   Not on file  Tobacco Use   Smoking status: Former    Types: Cigarettes    Quit date: 07/25/1990    Years since quitting: 31.5   Smokeless tobacco: Never  Vaping Use   Vaping Use: Never used  Substance and Sexual Activity   Alcohol use: Not Currently   Drug use: No   Sexual activity: Not on file  Other Topics Concern   Not on file  Social History Narrative   Not on file   Social Determinants of Health   Financial Resource Strain: Not on file  Food Insecurity: Not on file  Transportation Needs: Not on file  Physical Activity: Not on file  Stress: Not on file  Social Connections: Not on file     Family History: The patient's family history is not on file.  ROS:   Please see the history of present illness.     All other systems reviewed and are negative.  EKGs/Labs/Other Studies Reviewed:    The following studies were reviewed today: No new studies  EKG:  EKG is  ordered today.  The ekg ordered today demonstrates atrial fibrillation 79 left axis deviation poor R wave progression.  Recent Labs: 05/08/2021: ALT 28 05/09/2021: BUN 6; Creatinine, Ser 0.64; Hemoglobin 10.1; Magnesium  1.8; Platelets 236; Potassium 3.5; Sodium 137  Recent Lipid Panel No results found for: "CHOL", "TRIG", "HDL", "CHOLHDL", "VLDL", "LDLCALC", "LDLDIRECT"   Risk Assessment/Calculations:    CHA2DS2-VASc Score = 4   This indicates a 4.8% annual risk of stroke. The patient's score is based upon: CHF History: 0 HTN History: 1 Diabetes History: 1 Stroke History: 0 Vascular Disease History: 0 Age Score: 2 Gender Score: 0              Physical Exam:    VS:  BP 110/60 (BP Location: Left Arm, Patient Position: Sitting, Cuff Size: Normal)   Pulse 79   Ht '5\' 7"'$  (1.702 m)   Wt 150 lb (68 kg)   BMI 23.49 kg/m     Wt Readings from Last 3 Encounters:  02/14/22 150 lb (68 kg)  05/09/21 152 lb 1.9 oz (69 kg)  04/29/21 150 lb (68 kg)     GEN:  Well nourished, well developed in no acute distress HEENT: Normal NECK: No JVD; No carotid bruits LYMPHATICS: No lymphadenopathy CARDIAC: irreg, no murmurs, no rubs, gallops RESPIRATORY:  Clear to auscultation without rales, wheezing or rhonchi  ABDOMEN: Soft, non-tender, non-distended MUSCULOSKELETAL:  No edema; No deformity  SKIN: Warm and dry NEUROLOGIC:  Alert and oriented x 3 PSYCHIATRIC:  Normal affect   ASSESSMENT:    1. Permanent atrial fibrillation ([place])   2. Diabetes mellitus with coincident hypertension ([place])   3. Hyperlipidemia, unspecified hyperlipidemia type    PLAN:    In order of problems listed above:    Permanent atrial fibrillation -No changes made excellent rate control.  Continue.  On diltiazem 240 long-acting once a day  Chronic anticoagulation -Continuing with Xarelto 20 mg once a day.  No bleeding issues.  Last hemoglobin 14.1, creatinine 0.7 from outside labs.  Excellent.  Diabetes with hyperlipidemia -Continuing with atorvastatin 20 mg a day, LDL 88.  Excellent.  Hemoglobin A1c 6.3.   Gout -Very stable.  Takes colchicine.  No bleeding.  Status post right hip replacement 2022 - Continue with  exercises.  Unfortunately golf was uncomfortable for him after 3 holes.       Medication Adjustments/Labs and Tests Ordered: Current medicines are reviewed at length with the patient today.  Concerns regarding medicines are outlined above.  Orders Placed This Encounter  Procedures   EKG 12-Lead   No orders of the defined types were placed in this encounter.   Patient Instructions  Medication Instructions:  The current medical regimen is effective;  continue present plan and medications.  *If you need a refill on your cardiac medications before  your next appointment, please call your pharmacy*  Follow-Up: At [facility], you and your health needs are our priority.  As part of our continuing mission to provide you with exceptional heart care, we have created designated Provider Care Teams.  These Care Teams include your primary Cardiologist (physician) and Advanced Practice Providers (APPs -  Physician Assistants and Nurse Practitioners) who all work together to provide you with the care you need, when you need it.  We recommend signing up for the patient portal called "MyChart".  Sign up information is provided on this After Visit Summary.  MyChart is used to connect with patients for Virtual Visits (Telemedicine).  Patients are able to view lab/test results, encounter notes, upcoming appointments, etc.  Non-urgent messages can be sent to your provider as well.   To learn more about what you can do with MyChart, go to [email or web address].    Your next appointment:    1 year(s)  The format for your next appointment:   In Person  Provider:   Candee Furbish, MD {  Important Information About Sugar         Signed, Candee Furbish, MD  02/14/2022 9:44 AM    [place]

## 2022-02-14 NOTE — Patient Instructions (Signed)

## 2022-02-21 DIAGNOSIS — H04123 Dry eye syndrome of bilateral lacrimal glands: Secondary | ICD-10-CM | POA: Diagnosis not present

## 2022-02-21 DIAGNOSIS — E113391 Type 2 diabetes mellitus with moderate nonproliferative diabetic retinopathy without macular edema, right eye: Secondary | ICD-10-CM | POA: Diagnosis not present

## 2022-02-21 DIAGNOSIS — H40023 Open angle with borderline findings, high risk, bilateral: Secondary | ICD-10-CM | POA: Diagnosis not present

## 2022-02-21 DIAGNOSIS — H353123 Nonexudative age-related macular degeneration, left eye, advanced atrophic without subfoveal involvement: Secondary | ICD-10-CM | POA: Diagnosis not present

## 2022-02-25 DIAGNOSIS — H539 Unspecified visual disturbance: Secondary | ICD-10-CM | POA: Diagnosis not present

## 2022-02-25 DIAGNOSIS — I499 Cardiac arrhythmia, unspecified: Secondary | ICD-10-CM | POA: Diagnosis not present

## 2022-02-25 DIAGNOSIS — I6523 Occlusion and stenosis of bilateral carotid arteries: Secondary | ICD-10-CM | POA: Diagnosis not present

## 2022-03-22 DIAGNOSIS — Z471 Aftercare following joint replacement surgery: Secondary | ICD-10-CM | POA: Diagnosis not present

## 2022-03-22 DIAGNOSIS — Z96641 Presence of right artificial hip joint: Secondary | ICD-10-CM | POA: Diagnosis not present

## 2022-03-22 DIAGNOSIS — M545 Low back pain, unspecified: Secondary | ICD-10-CM | POA: Diagnosis not present

## 2022-03-24 ENCOUNTER — Other Ambulatory Visit: Payer: Self-pay | Admitting: Cardiology

## 2022-04-04 DIAGNOSIS — M5136 Other intervertebral disc degeneration, lumbar region: Secondary | ICD-10-CM | POA: Diagnosis not present

## 2022-04-04 DIAGNOSIS — M545 Low back pain, unspecified: Secondary | ICD-10-CM | POA: Diagnosis not present

## 2022-04-12 DIAGNOSIS — J309 Allergic rhinitis, unspecified: Secondary | ICD-10-CM | POA: Diagnosis not present

## 2022-04-12 DIAGNOSIS — M1990 Unspecified osteoarthritis, unspecified site: Secondary | ICD-10-CM | POA: Diagnosis not present

## 2022-04-12 DIAGNOSIS — Z23 Encounter for immunization: Secondary | ICD-10-CM | POA: Diagnosis not present

## 2022-04-12 DIAGNOSIS — I10 Essential (primary) hypertension: Secondary | ICD-10-CM | POA: Diagnosis not present

## 2022-04-12 DIAGNOSIS — E113391 Type 2 diabetes mellitus with moderate nonproliferative diabetic retinopathy without macular edema, right eye: Secondary | ICD-10-CM | POA: Diagnosis not present

## 2022-04-12 DIAGNOSIS — E782 Mixed hyperlipidemia: Secondary | ICD-10-CM | POA: Diagnosis not present

## 2022-04-12 DIAGNOSIS — I482 Chronic atrial fibrillation, unspecified: Secondary | ICD-10-CM | POA: Diagnosis not present

## 2022-04-12 DIAGNOSIS — E113291 Type 2 diabetes mellitus with mild nonproliferative diabetic retinopathy without macular edema, right eye: Secondary | ICD-10-CM | POA: Diagnosis not present

## 2022-04-12 DIAGNOSIS — D6869 Other thrombophilia: Secondary | ICD-10-CM | POA: Diagnosis not present

## 2022-05-09 DIAGNOSIS — R059 Cough, unspecified: Secondary | ICD-10-CM | POA: Diagnosis not present

## 2022-05-09 DIAGNOSIS — Z03818 Encounter for observation for suspected exposure to other biological agents ruled out: Secondary | ICD-10-CM | POA: Diagnosis not present

## 2022-05-16 DIAGNOSIS — I10 Essential (primary) hypertension: Secondary | ICD-10-CM | POA: Diagnosis not present

## 2022-05-16 DIAGNOSIS — M1990 Unspecified osteoarthritis, unspecified site: Secondary | ICD-10-CM | POA: Diagnosis not present

## 2022-05-16 DIAGNOSIS — M19049 Primary osteoarthritis, unspecified hand: Secondary | ICD-10-CM | POA: Diagnosis not present

## 2022-05-16 DIAGNOSIS — E782 Mixed hyperlipidemia: Secondary | ICD-10-CM | POA: Diagnosis not present

## 2022-05-16 DIAGNOSIS — E1169 Type 2 diabetes mellitus with other specified complication: Secondary | ICD-10-CM | POA: Diagnosis not present

## 2022-05-16 DIAGNOSIS — N400 Enlarged prostate without lower urinary tract symptoms: Secondary | ICD-10-CM | POA: Diagnosis not present

## 2022-07-13 DIAGNOSIS — I482 Chronic atrial fibrillation, unspecified: Secondary | ICD-10-CM | POA: Diagnosis not present

## 2022-07-13 DIAGNOSIS — I10 Essential (primary) hypertension: Secondary | ICD-10-CM | POA: Diagnosis not present

## 2022-07-13 DIAGNOSIS — R42 Dizziness and giddiness: Secondary | ICD-10-CM | POA: Diagnosis not present

## 2022-07-21 ENCOUNTER — Other Ambulatory Visit: Payer: Self-pay | Admitting: Cardiology

## 2022-08-15 DIAGNOSIS — H3561 Retinal hemorrhage, right eye: Secondary | ICD-10-CM | POA: Diagnosis not present

## 2022-08-15 DIAGNOSIS — E113311 Type 2 diabetes mellitus with moderate nonproliferative diabetic retinopathy with macular edema, right eye: Secondary | ICD-10-CM | POA: Diagnosis not present

## 2022-08-15 DIAGNOSIS — H353123 Nonexudative age-related macular degeneration, left eye, advanced atrophic without subfoveal involvement: Secondary | ICD-10-CM | POA: Diagnosis not present

## 2022-08-15 DIAGNOSIS — H40023 Open angle with borderline findings, high risk, bilateral: Secondary | ICD-10-CM | POA: Diagnosis not present

## 2022-08-16 ENCOUNTER — Other Ambulatory Visit: Payer: Self-pay | Admitting: *Deleted

## 2022-08-16 DIAGNOSIS — I4821 Permanent atrial fibrillation: Secondary | ICD-10-CM

## 2022-08-16 MED ORDER — RIVAROXABAN 20 MG PO TABS: 20.0000 mg | ORAL_TABLET | Freq: Every day | ORAL | 1 refills | Status: DC

## 2022-08-16 NOTE — Telephone Encounter (Signed)
Xarelto '20mg'$  refill request received. Pt is 82 years old, weight-68kg, Crea-0.77 on 10/13/2021 via [facility] from [place], last seen by Dr. Marlou Porch on 02/14/2022, Diagnosis-Afib, CrCl-73.59 mL/min; Dose is appropriate based on dosing criteria. Will send in refill to requested pharmacy.

## 2022-09-12 ENCOUNTER — Encounter (INDEPENDENT_AMBULATORY_CARE_PROVIDER_SITE_OTHER): Payer: Medicare HMO | Admitting: Ophthalmology

## 2022-09-26 IMAGING — DX DG FOOT COMPLETE 3+V*R*
3 series · 3 of 3 positions shown · non-contrast
Comparison: None.

CLINICAL DATA: Right foot swelling without known injury.

EXAM:
RIGHT FOOT COMPLETE - 3+ VIEW

[foot ap]
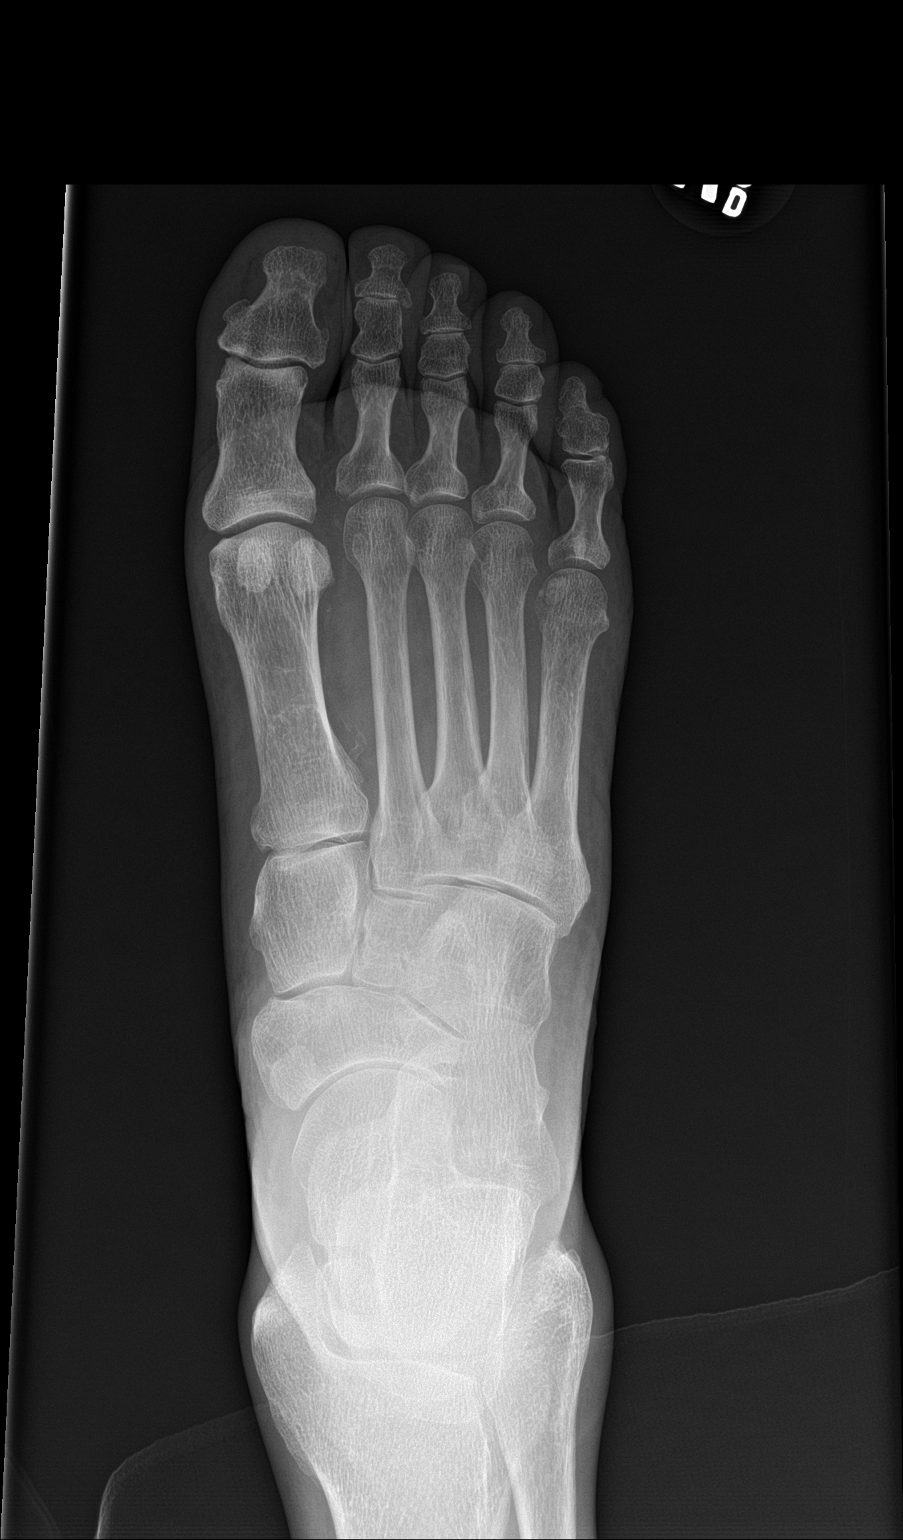

[foot obl]
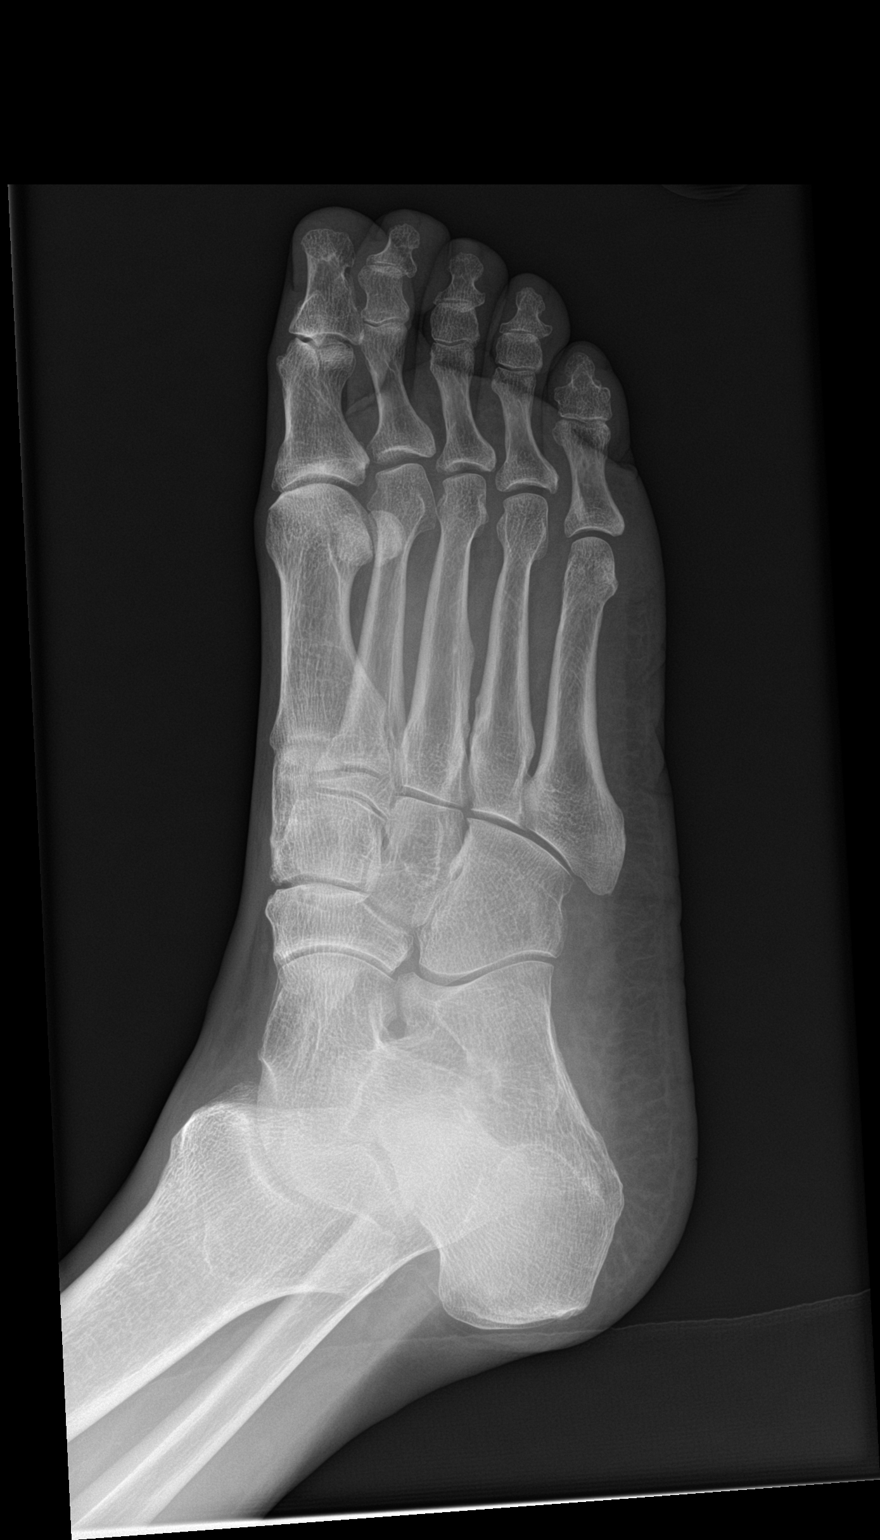

[foot lat]
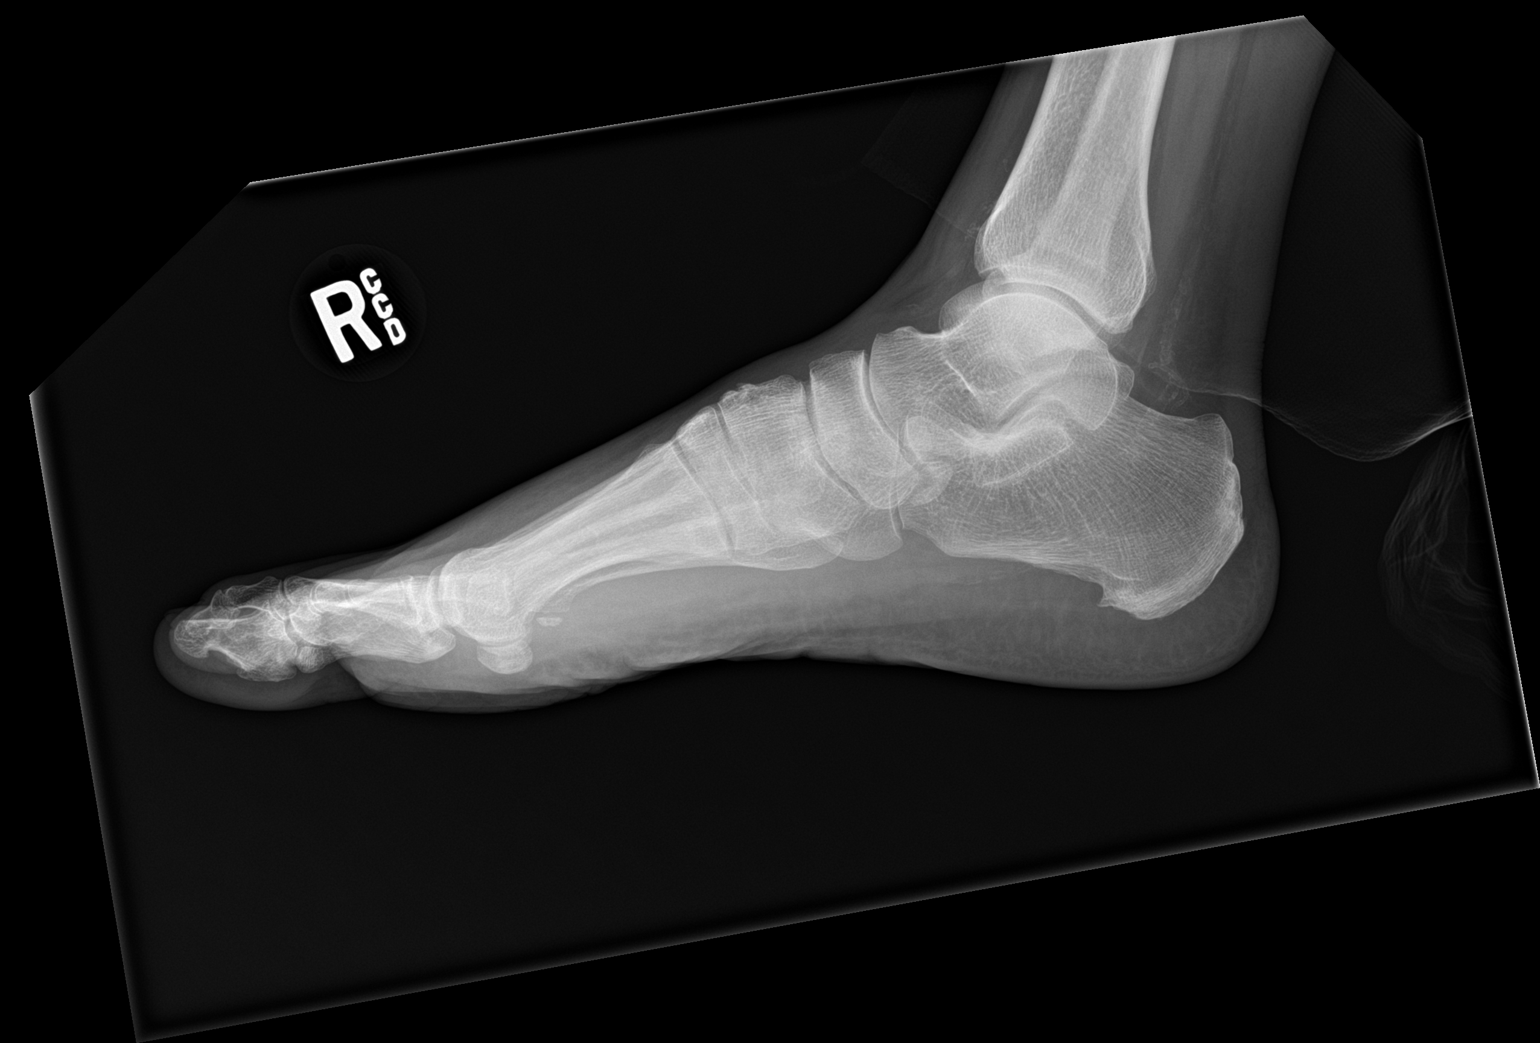

[3 of 3 positions shown; findings below may reference images not displayed]

FINDINGS: There is no evidence of fracture or dislocation. There is no
evidence of arthropathy or other focal bone abnormality. Soft
tissues are unremarkable.
IMPRESSION: Negative.

## 2022-09-27 NOTE — Progress Notes (Signed)
[facility] Note  10/03/2022     CHIEF COMPLAINT Patient presents for Retina Evaluation   HISTORY OF PRESENT ILLNESS: Robert Irwin is a 82 y.o. male who presents to the clinic today for:   HPI     Retina Evaluation   In both eyes.  Associated Symptoms Floaters.  Negative for Distortion and Blind Spot.  I, the attending physician,  performed the HPI with the patient and updated documentation appropriately.        Comments   Patient is here today based on a referral from Dr. Herbert Deaner for DBH/DME in both eyes. He is complaining of a shadow that moves around. His blood sugar was 130. His A1C he thinks is 6.8.      Last edited by Bernarda Caffey, MD on 10/03/2022 12:20 PM.    Pt is here on the referral of Dr. Lucianne Lei for DME, he has been seeing her for about 2 years, Dr. Lucianne Lei has been watching swelling in the right eye for awhile pts last A1c was 6.3 on 09.24.23, he is taking glimepiride, pt does not check his BP at home, but states it is usually between 120-130, he takes his medication as directed  Referring physician: Lisabeth Pick, MD [place],  [place] [address]  HISTORICAL INFORMATION:   Selected notes from the MEDICAL RECORD NUMBER Referred by Dr. Lucianne Lei for macular edema OD LEE:  Ocular Hx- PMH-    CURRENT MEDICATIONS: No current outpatient medications on file. (Ophthalmic Drugs)   No current facility-administered medications for this visit. (Ophthalmic Drugs)   Current Outpatient Medications (Other)  Medication Sig   acetaminophen (TYLENOL) 325 MG tablet Take 2 tablets (650 mg total) by mouth every 6 (six) hours as needed for mild pain (or Fever >/= 101).   atorvastatin (LIPITOR) 20 MG tablet TAKE 1 TABLET EVERY DAY   calcium citrate-vitamin D 200-200 MG-UNIT TABS Take 1 tablet by mouth daily.   diltiazem (TIADYLT ER) 240 MG 24 hr capsule Take 1 capsule (240 mg total) by mouth daily.   glimepiride (AMARYL) 2 MG tablet Take 2 mg by  mouth daily before breakfast.   Glucosamine-Chondroitin (OSTEO BI-FLEX REGULAR STRENGTH PO) Take 1 tablet by mouth 2 (two) times daily.   Multiple Vitamin (MULITIVITAMIN WITH MINERALS) TABS Take 1 tablet by mouth daily.   prochlorperazine (COMPAZINE) 10 MG tablet Take 1 tablet (10 mg total) by mouth every 6 (six) hours as needed for nausea or vomiting.   rivaroxaban (XARELTO) 20 MG TABS tablet Take 1 tablet (20 mg total) by mouth daily with supper.   traMADol (ULTRAM) 50 MG tablet Take 1 tablet (50 mg total) by mouth every 6 (six) hours as needed for moderate pain.   No current facility-administered medications for this visit. (Other)   REVIEW OF SYSTEMS: ROS   Positive for: Endocrine, Eyes Last edited by Annie Paras, COT on 10/03/2022  8:15 AM.     ALLERGIES No Known Allergies  PAST MEDICAL HISTORY Past Medical History:  Diagnosis Date   A-fib [facility])    Arthritis    Atrial fibrillation ([place])    Diabetes mellitus    Dysrhythmia    ED (erectile dysfunction)    Epistaxis    Hyperlipidemia    Hypertension    Past Surgical History:  Procedure Laterality Date   APPENDECTOMY     NASAL MASS EXCISION     NASAL SINUS SURGERY  12/16/2011   Procedure: ENDOSCOPIC SINUS SURGERY;  Surgeon: Rozetta Nunnery, MD;  Location: [facility] OR;  Service: ENT;  Laterality: N/A;  for control of epistaxis   TOTAL HIP ARTHROPLASTY Right 04/29/2021   Procedure: TOTAL HIP ARTHROPLASTY ANTERIOR APPROACH;  Surgeon: Rod Can, MD;  Location: [facility] ORS;  Service: Orthopedics;  Laterality: Right;   FAMILY HISTORY History reviewed. No pertinent family history.  SOCIAL HISTORY Social History   Tobacco Use   Smoking status: Former    Types: Cigarettes    Quit date: 07/25/1990    Years since quitting: 32.2   Smokeless tobacco: Never  Vaping Use   Vaping Use: Never used  Substance Use Topics   Alcohol use: Not Currently   Drug use: No       OPHTHALMIC EXAM:  Base Eye Exam     Visual  Acuity (Snellen - Linear)       Right Left   Dist [place] 20/25 +1 20/20         Tonometry (Tonopen, 8:18 AM)       Right Left   Pressure 16 16         Pupils       Dark Light Shape React APD   Right 3 2 Round Slow None   Left 3 2 Round Slow None         Visual Fields       Left Right    Full Full         Extraocular Movement       Right Left    Full, Ortho Full, Ortho         Neuro/Psych     Oriented x3: Yes         Dilation     Both eyes: 1.0% Mydriacyl, 2.5% Phenylephrine @ 8:16 AM           Slit Lamp and Fundus Exam     Slit Lamp Exam       Right Left   Lids/Lashes Dermatochalasis - upper lid Dermatochalasis - upper lid   Conjunctiva/Sclera Melanosis, nasal and temporal pinguecula Melanosis, nasal and temporal pinguecula   Cornea mild arcus, tear film debris, well healed cataract wound mild arcus, tear film debris, well healed cataract wound   Anterior Chamber deep and clear deep and clear   Iris Round and dilated Round and dilated   Lens 3 piece PC IOL in good position with open PC 3 piece PC IOL in good position with open PC   Anterior Vitreous Vitreous syneresis Vitreous syneresis         Fundus Exam       Right Left   Disc Pink and Sharp, mild hyperemia mild Pallor, Sharp rim, mild PPP   C/D Ratio 0.3 0.4   Macula Flat, Blunted foveal reflex, central cystic changes, Iscattered RH, RPE mottling Flat, Blunted foveal reflex, RPE mottling and clumping, No heme or edema   Vessels Attenuated arterioles, dilated and tortuous venules, AV crossing changes attenuated, mild tortuosity, mild AV crossing changes   Periphery Attached, 360 MA/DBH greatest temporally Attached           IMAGING AND PROCEDURES  Imaging and Procedures for 10/03/2022  OCT, Retina - OU - Both Eyes       Right Eye Quality was good. Central Foveal Thickness: 346. Progression has no prior data. Findings include no SRF, abnormal foveal contour, intraretinal fluid  (Central IRF/cystic changes superior fovea).   Left Eye Quality was good. Central Foveal Thickness: 304. Progression has no prior data.  Findings include normal foveal contour, no IRF, no SRF.   Notes *Images captured and stored on drive  Diagnosis / Impression:  OD: Central IRF/cystic changes superior fovea; no SRF OS: NFP, no IRF/SRF  Clinical management:  See below  Abbreviations: NFP - Normal foveal profile. CME - cystoid macular edema. PED - pigment epithelial detachment. IRF - intraretinal fluid. SRF - subretinal fluid. EZ - ellipsoid zone. ERM - epiretinal membrane. ORA - outer retinal atrophy. ORT - outer retinal tubulation. SRHM - subretinal hyper-reflective material. IRHM - intraretinal hyper-reflective material      Fluorescein Angiography Optos (Transit OD)       Right Eye Progression has no prior data. Early phase findings include delayed filling, microaneurysm, vascular perfusion defect. Mid/Late phase findings include leakage, microaneurysm, vascular perfusion defect (Mild perifoveal petaloid leakage, hyper fluorescence of the disc, mild MA with leakage peripherally ).   Left Eye Progression has no prior data. Early phase findings include staining (Focal hyper fluorescent staining nasal macula). Mid/Late phase findings include staining (Mild late hyper fluorescence of disc, no significant MA or leakage).   Notes **Images stored on drive**  Impression: OD: Mild perifoveal petaloid leakage, hyper fluorescence of the disc, mild MA with leakage peripherally -- ?remote RVO OS: Mild late hyper fluorescence of disc, Focal hyper fluorescent staining nasal macula, no significant MA or leakage            ASSESSMENT/PLAN:    ICD-10-CM   1. Central retinal vein occlusion with macular edema of right eye  H34.8110 OCT, Retina - OU - Both Eyes    2. Diabetes mellitus type 2 without retinopathy ([place])  E11.9     3. Essential hypertension  I10     4. Hypertensive  retinopathy of both eyes  H35.033 Fluorescein Angiography Optos (Transit OD)    5. Pseudophakia, both eyes  Z96.1      Remote CRVO w/ mild CME OD - The natural history of retinal vein occlusion and macular edema and treatment options including observation, laser photocoagulation, and intravitreal antiVEGF injection with Avastin, Lucentis, Eylea, Vabysmo and intravitreal injection of steroids with triamcinolone and Ozurdex and the complications of these procedures including loss of vision, infection, cataract, glaucoma, and retinal detachment were discussed with patient. - Specifically discussed stabilization with anti-VEGF agents and increased potential for visual improvements.  Also discussed need for frequent follow up and potentially multiple injections given the chronic nature of the disease process - BCVA 20/25 OD and pt with minimal symptoms or visual complaints - OCT shows OD: Central IRF/cystic changes superior fovea - FA (03.11.24) shows Mild perifoveal petaloid leakage, hyper fluorescence of the disc, mild MA with leakage peripherally -- ?remote RVO - discussed findings and treatment options - no treatment recommended at this time -- will monitor - F/U 3 mos, sooner prn -- DFE/OCT  2. Diabetes mellitus, type 2 without retinopathy - The incidence, risk factors for progression, natural history and treatment options for diabetic retinopathy  were discussed with patient.   - The need for close monitoring of blood glucose, blood pressure, and serum lipids, avoiding cigarette or any type of tobacco, and the need for long term follow up was also discussed with patient.  - FA 03.11.23 shows minimal MA OU - f/u in 1 year, sooner prn  3,4. Hypertensive retinopathy OU - discussed importance of tight BP control - monitor  5. Mixed Cataract OU - The symptoms of cataract, surgical options, and treatments and risks were discussed with patient. - discussed  diagnosis and progression - not yet  visually significant - monitor for now  Ophthalmic Meds Ordered this visit:  No orders of the defined types were placed in this encounter.      Return in about 3 months (around 01/03/2023) for remote CRVO OD, Dilated Exam, OCT.  There are no Patient Instructions on file for this visit.   Explained the diagnoses, plan, and follow up with the patient and they expressed understanding.  Patient expressed understanding of the importance of proper follow up care.   This document serves as a record of services personally performed by Gardiner Sleeper, MD, PhD. It was created on their behalf by Roselee Nova, COMT. The creation of this record is the provider's dictation and/or activities during the visit.  Electronically signed by: Roselee Nova, COMT 10/03/22 12:31 PM  This document serves as a record of services personally performed by Gardiner Sleeper, MD, PhD. It was created on their behalf by San Jetty. Owens Shark, OA an ophthalmic technician. The creation of this record is the provider's dictation and/or activities during the visit.    Electronically signed by: San Jetty. Marguerita Merles 03.11.2024 12:31 PM  Gardiner Sleeper, M.D., Ph.D. Diseases & Surgery of the Retina and Vitreous [facility]  I have reviewed the above documentation for accuracy and completeness, and I agree with the above. Gardiner Sleeper, M.D., Ph.D. 10/03/22 12:32 PM  Abbreviations: M myopia (nearsighted); A astigmatism; H hyperopia (farsighted); P presbyopia; Mrx spectacle prescription;  CTL contact lenses; OD right eye; OS left eye; OU both eyes  XT exotropia; ET esotropia; PEK punctate epithelial keratitis; PEE punctate epithelial erosions; DES dry eye syndrome; MGD meibomian gland dysfunction; ATs artificial tears; PFAT's preservative free artificial tears; [place] nuclear sclerotic cataract; PSC posterior subcapsular cataract; ERM epi-retinal membrane; PVD posterior vitreous detachment; RD retinal detachment;  DM diabetes mellitus; DR diabetic retinopathy; NPDR non-proliferative diabetic retinopathy; PDR proliferative diabetic retinopathy; CSME clinically significant macular edema; DME diabetic macular edema; dbh dot blot hemorrhages; CWS cotton wool spot; POAG primary open angle glaucoma; C/D cup-to-disc ratio; HVF humphrey visual field; GVF goldmann visual field; OCT optical coherence tomography; IOP intraocular pressure; BRVO Branch retinal vein occlusion; CRVO central retinal vein occlusion; CRAO central retinal artery occlusion; BRAO branch retinal artery occlusion; RT retinal tear; SB scleral buckle; PPV pars plana vitrectomy; VH Vitreous hemorrhage; PRP panretinal laser photocoagulation; IVK intravitreal kenalog; VMT vitreomacular traction; MH Macular hole;  NVD neovascularization of the disc; NVE neovascularization elsewhere; AREDS age related eye disease study; ARMD age related macular degeneration; POAG primary open angle glaucoma; EBMD epithelial/anterior basement membrane dystrophy; ACIOL anterior chamber intraocular lens; IOL intraocular lens; PCIOL posterior chamber intraocular lens; Phaco/IOL phacoemulsification with intraocular lens placement; [place] photorefractive keratectomy; LASIK laser assisted in situ keratomileusis; HTN hypertension; DM diabetes mellitus; COPD chronic obstructive pulmonary disease

## 2022-10-03 ENCOUNTER — Encounter (INDEPENDENT_AMBULATORY_CARE_PROVIDER_SITE_OTHER): Payer: Self-pay | Admitting: Ophthalmology

## 2022-10-03 ENCOUNTER — Ambulatory Visit (INDEPENDENT_AMBULATORY_CARE_PROVIDER_SITE_OTHER): Payer: Medicare HMO | Admitting: Ophthalmology

## 2022-10-03 DIAGNOSIS — H35033 Hypertensive retinopathy, bilateral: Secondary | ICD-10-CM

## 2022-10-03 DIAGNOSIS — I10 Essential (primary) hypertension: Secondary | ICD-10-CM

## 2022-10-03 DIAGNOSIS — E119 Type 2 diabetes mellitus without complications: Secondary | ICD-10-CM

## 2022-10-03 DIAGNOSIS — H34811 Central retinal vein occlusion, right eye, with macular edema: Secondary | ICD-10-CM

## 2022-10-03 DIAGNOSIS — Z961 Presence of intraocular lens: Secondary | ICD-10-CM | POA: Diagnosis not present

## 2022-10-04 IMAGING — CT CT ABD-PELV W/ CM
2 of 5 series · 16 of 46 positions shown, 18 images · IV contrast (APPLIED)
Comparison: None.

CLINICAL DATA: Abdominal distension

EXAM:
CT ABDOMEN AND PELVIS WITH CONTRAST
TECHNIQUE: Multidetector CT imaging of the abdomen and pelvis was performed
using the standard protocol following bolus administration of
intravenous contrast.
CONTRAST:  100mL OMNIPAQUE IOHEXOL 300 MG/ML  SOLN

[Series 2: abd pel w · axial · 0.75mm/px · z∈[-473,-73]mm · 13 of 90 slices shown, 15 images]
[im 5/90  soft-tissue]
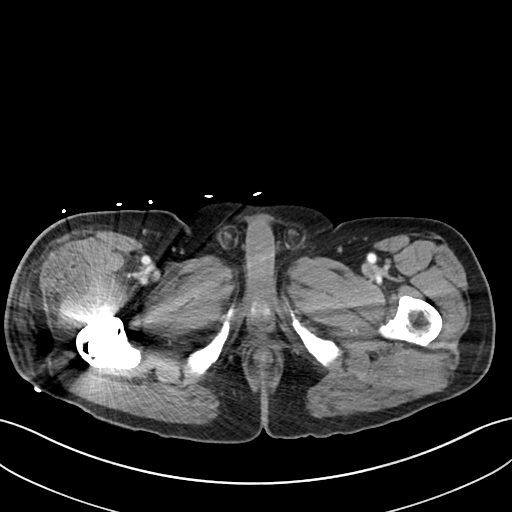
[im 5/90  bone]
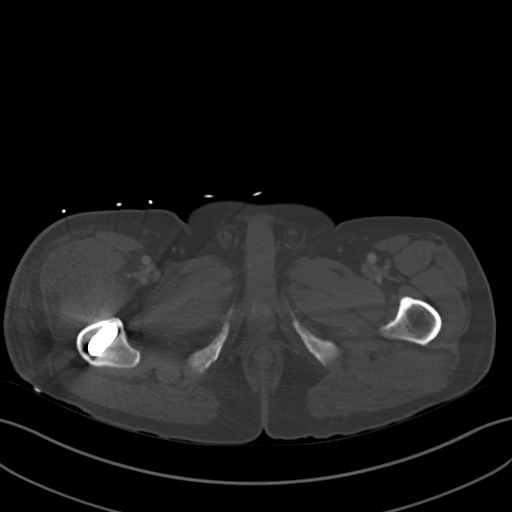
[im 15/90  soft-tissue]
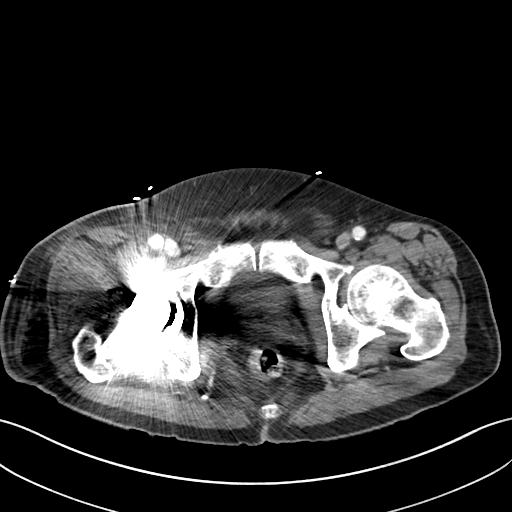
[im 19/90  soft-tissue]
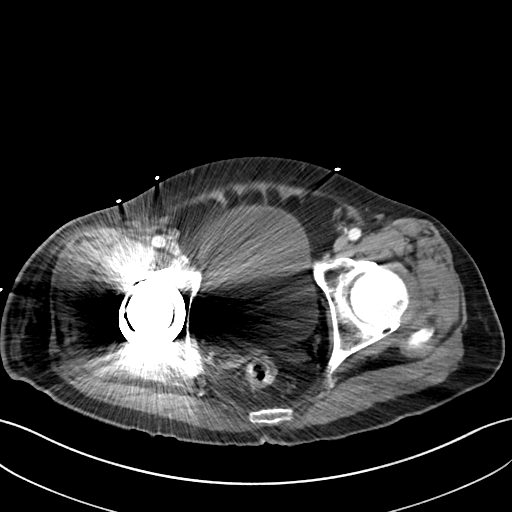
[im 24/90  soft-tissue]
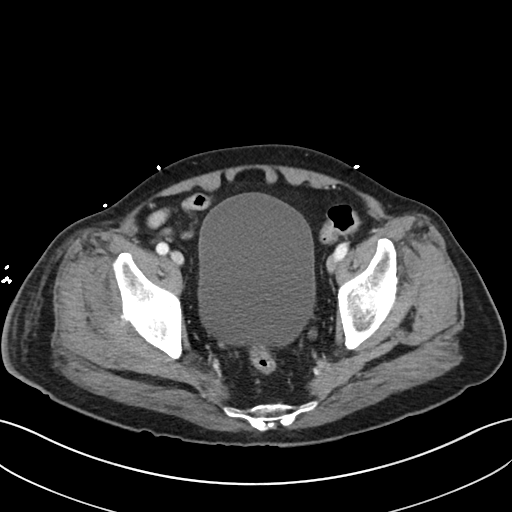
[im 33/90  soft-tissue]
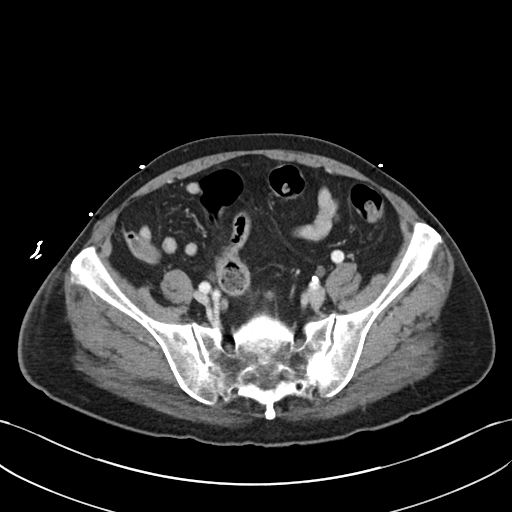
[im 38/90  soft-tissue]
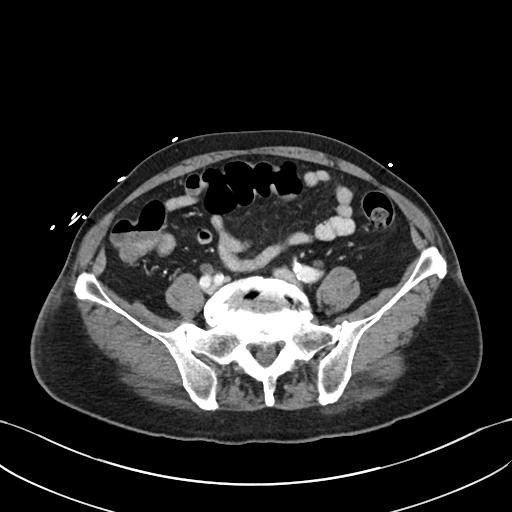
[im 47/90  soft-tissue]
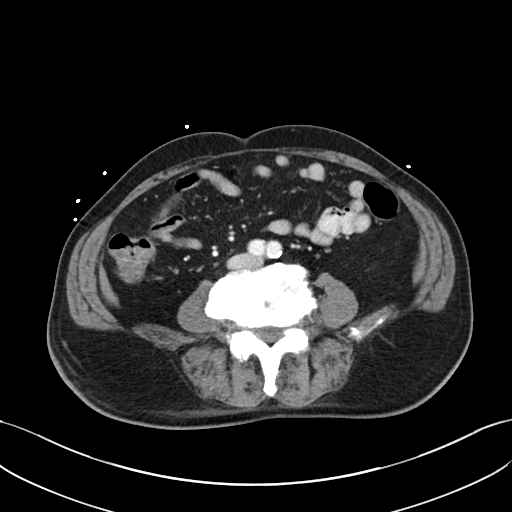
[im 52/90  soft-tissue]
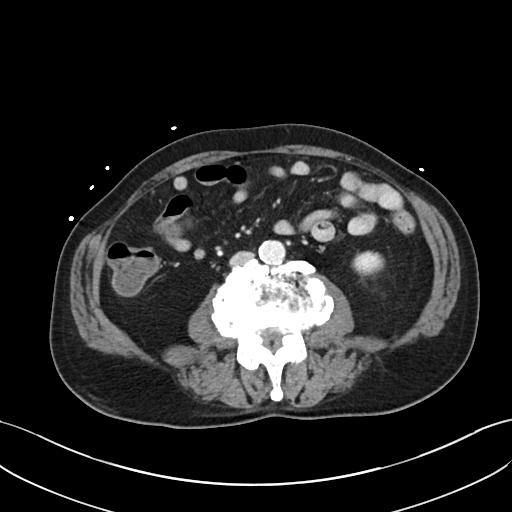
[im 57/90  soft-tissue]
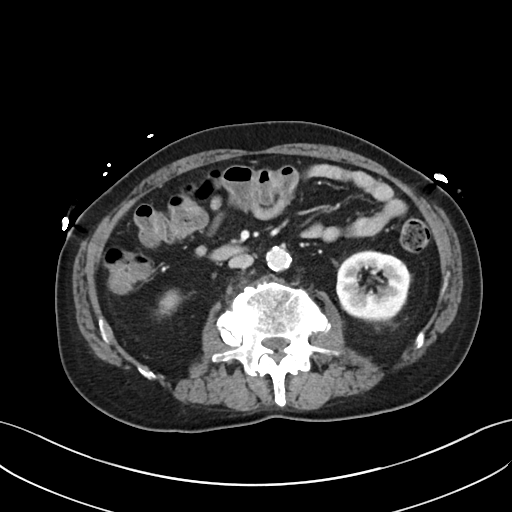
[im 57/90  bone]
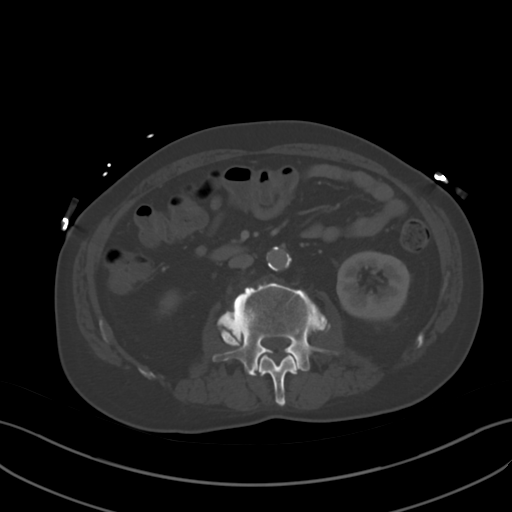
[im 66/90  soft-tissue]
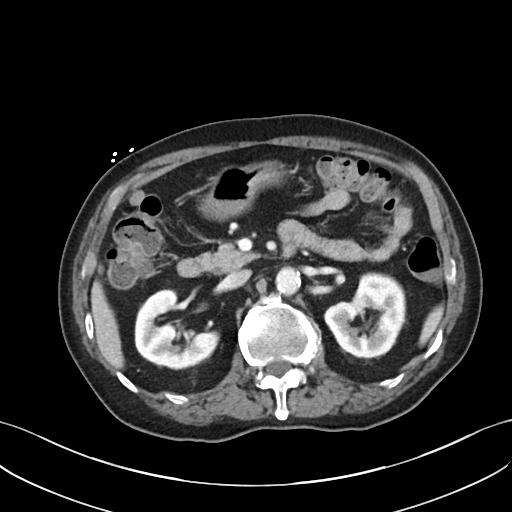
[im 71/90  soft-tissue]
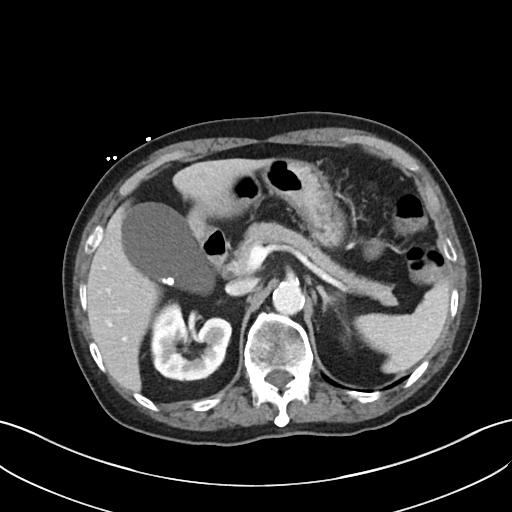
[im 75/90  soft-tissue]
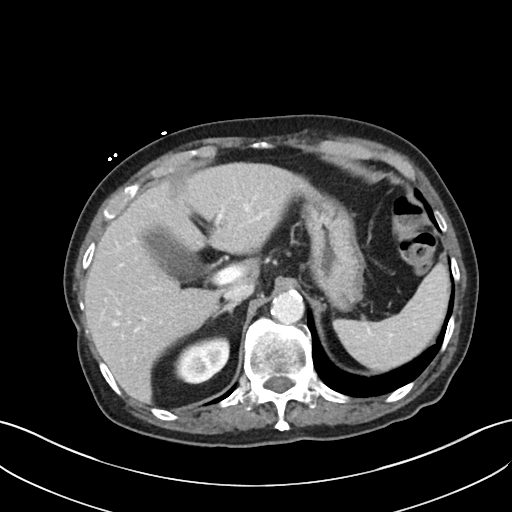
[im 85/90  soft-tissue]
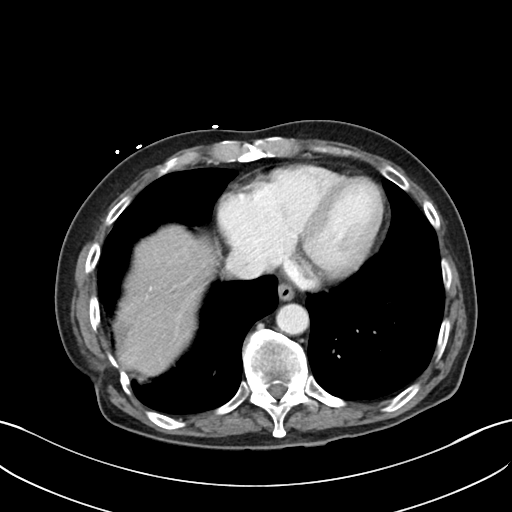

[Series 5: coronal · coronal · 0.79mm/px · 3 of 85 slices shown]
[im 29/85  soft-tissue]
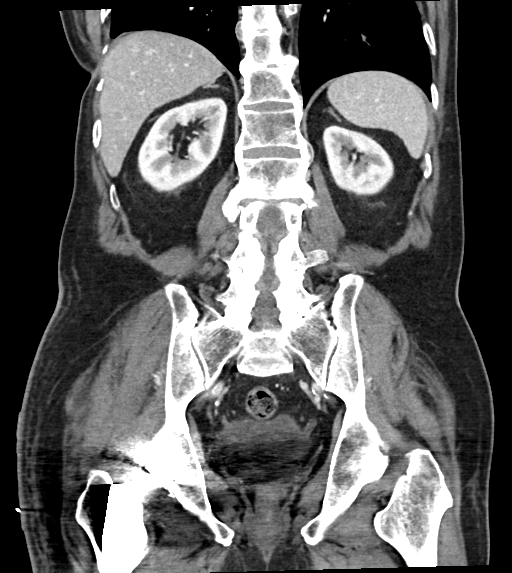
[im 38/85  soft-tissue]
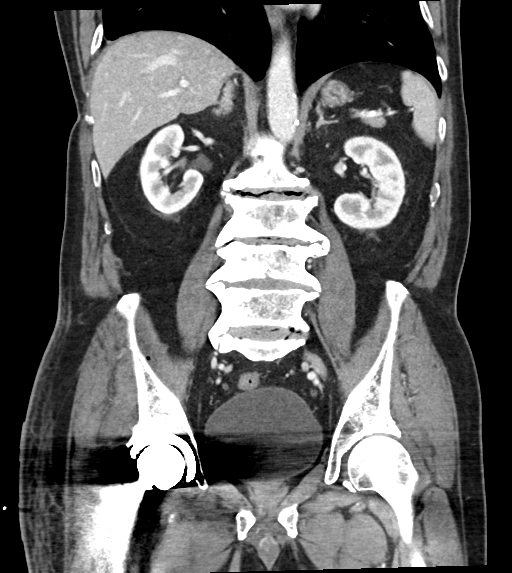
[im 47/85  soft-tissue]
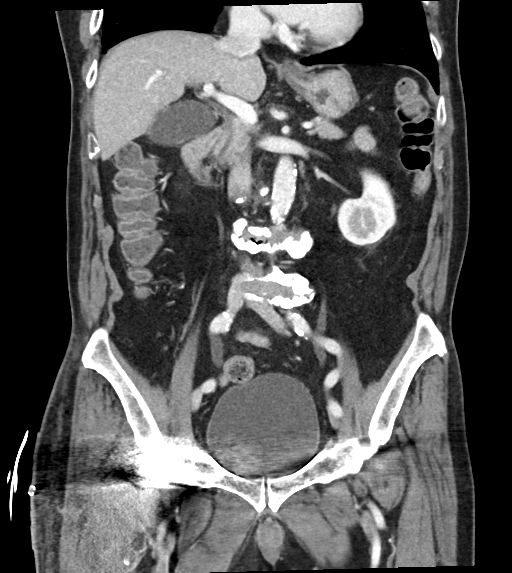

[16 of 46 positions shown; findings below may reference images not displayed]

FINDINGS: Lower chest: No acute abnormality.

Hepatobiliary: No suspicious focal liver lesion. Gallbladder is
mildly distended contains gallstones with no evidence of gallbladder
wall thickening. No biliary ductal dilation.

Pancreas: Unremarkable. No pancreatic ductal dilatation or
surrounding inflammatory changes.

Spleen: Normal in size without focal abnormality.

Adrenals/Urinary Tract: Adrenal glands are unremarkable. Kidneys are
normal, without renal calculi, focal lesion, or hydronephrosis.
Bladder is unremarkable.

Stomach/Bowel: Stomach is within normal limits. Appendix is not
visualized. No evidence of bowel wall thickening, distention, or
inflammatory changes.

Vascular/Lymphatic: Aortic atherosclerosis. No enlarged abdominal or
pelvic lymph nodes.

Reproductive: Calcifications of the prostate.

Other: No abdominal wall hernia or abnormality. No abdominopelvic
ascites.

Musculoskeletal: Postsurgical changes of right total hip
arthroplasty including small locules of air in the surrounding soft
tissues and overlying skin closure staples. Moderate degenerative
disc disease of the lumbar spine. Otherwise, no acute osseous
abnormality.
IMPRESSION: No acute CT findings in the abdomen or pelvis.

Postsurgical changes of right hip replacement.

Aortic Atherosclerosis ([id number]).

## 2022-10-20 DIAGNOSIS — I10 Essential (primary) hypertension: Secondary | ICD-10-CM | POA: Diagnosis not present

## 2022-10-20 DIAGNOSIS — M109 Gout, unspecified: Secondary | ICD-10-CM | POA: Diagnosis not present

## 2022-10-20 DIAGNOSIS — Z0000 Encounter for general adult medical examination without abnormal findings: Secondary | ICD-10-CM | POA: Diagnosis not present

## 2022-10-20 DIAGNOSIS — I482 Chronic atrial fibrillation, unspecified: Secondary | ICD-10-CM | POA: Diagnosis not present

## 2022-10-20 DIAGNOSIS — G4700 Insomnia, unspecified: Secondary | ICD-10-CM | POA: Diagnosis not present

## 2022-10-20 DIAGNOSIS — D6869 Other thrombophilia: Secondary | ICD-10-CM | POA: Diagnosis not present

## 2022-10-20 DIAGNOSIS — E113311 Type 2 diabetes mellitus with moderate nonproliferative diabetic retinopathy with macular edema, right eye: Secondary | ICD-10-CM | POA: Diagnosis not present

## 2022-10-20 DIAGNOSIS — Z23 Encounter for immunization: Secondary | ICD-10-CM | POA: Diagnosis not present

## 2022-10-20 DIAGNOSIS — Z1331 Encounter for screening for depression: Secondary | ICD-10-CM | POA: Diagnosis not present

## 2022-10-20 DIAGNOSIS — M519 Unspecified thoracic, thoracolumbar and lumbosacral intervertebral disc disorder: Secondary | ICD-10-CM | POA: Diagnosis not present

## 2022-10-20 DIAGNOSIS — N400 Enlarged prostate without lower urinary tract symptoms: Secondary | ICD-10-CM | POA: Diagnosis not present

## 2022-10-20 DIAGNOSIS — E782 Mixed hyperlipidemia: Secondary | ICD-10-CM | POA: Diagnosis not present

## 2022-11-09 DIAGNOSIS — M109 Gout, unspecified: Secondary | ICD-10-CM | POA: Diagnosis not present

## 2022-12-26 ENCOUNTER — Encounter (INDEPENDENT_AMBULATORY_CARE_PROVIDER_SITE_OTHER): Payer: Medicare HMO | Admitting: Ophthalmology

## 2022-12-26 DIAGNOSIS — H35033 Hypertensive retinopathy, bilateral: Secondary | ICD-10-CM

## 2022-12-26 DIAGNOSIS — Z961 Presence of intraocular lens: Secondary | ICD-10-CM

## 2022-12-26 DIAGNOSIS — E119 Type 2 diabetes mellitus without complications: Secondary | ICD-10-CM

## 2022-12-26 DIAGNOSIS — H34811 Central retinal vein occlusion, right eye, with macular edema: Secondary | ICD-10-CM

## 2022-12-26 DIAGNOSIS — I10 Essential (primary) hypertension: Secondary | ICD-10-CM

## 2023-01-02 ENCOUNTER — Encounter (INDEPENDENT_AMBULATORY_CARE_PROVIDER_SITE_OTHER): Payer: Medicare HMO | Admitting: Ophthalmology

## 2023-01-05 NOTE — Progress Notes (Signed)
[facility] - Clinic Note  01/16/2023     CHIEF COMPLAINT Patient presents for Retina Follow Up   HISTORY OF PRESENT ILLNESS: Robert Irwin is a 82 y.o. male who presents to the clinic today for:   HPI     Retina Follow Up   Patient presents with  CRVO/BRVO.  In right eye.  This started 3 months ago.  Duration of 3 months.  Since onset it is stable.  I, the attending physician,  performed the HPI with the patient and updated documentation appropriately.        Comments   3 month retina follow up pt is reporting no vision changes noticed he is seeing some floaters but denies any flashes of light       Last edited by Rennis Chris, MD on 01/16/2023  1:15 PM.    Pt states vision is the same, he is not on BP meds  Referring physician: Diona Foley, MD [address] [place],  [place] [address]  HISTORICAL INFORMATION:   Selected notes from the MEDICAL RECORD NUMBER Referred by Dr. Zenaida Niece for macular edema OD LEE:  Ocular Hx- PMH-    CURRENT MEDICATIONS: No current outpatient medications on file. (Ophthalmic Drugs)   No current facility-administered medications for this visit. (Ophthalmic Drugs)   Current Outpatient Medications (Other)  Medication Sig   acetaminophen (TYLENOL) 325 MG tablet Take 2 tablets (650 mg total) by mouth every 6 (six) hours as needed for mild pain (or Fever >/= 101).   atorvastatin (LIPITOR) 20 MG tablet TAKE 1 TABLET EVERY DAY   calcium citrate-vitamin D 200-200 MG-UNIT TABS Take 1 tablet by mouth daily.   diltiazem (TIADYLT ER) 240 MG 24 hr capsule Take 1 capsule (240 mg total) by mouth daily.   glimepiride (AMARYL) 2 MG tablet Take 2 mg by mouth daily before breakfast.   Glucosamine-Chondroitin (OSTEO BI-FLEX REGULAR STRENGTH PO) Take 1 tablet by mouth 2 (two) times daily.   Multiple Vitamin (MULITIVITAMIN WITH MINERALS) TABS Take 1 tablet by mouth daily.   prochlorperazine (COMPAZINE) 10 MG tablet Take 1 tablet (10 mg  total) by mouth every 6 (six) hours as needed for nausea or vomiting.   rivaroxaban (XARELTO) 20 MG TABS tablet Take 1 tablet (20 mg total) by mouth daily with supper.   traMADol (ULTRAM) 50 MG tablet Take 1 tablet (50 mg total) by mouth every 6 (six) hours as needed for moderate pain.   No current facility-administered medications for this visit. (Other)   REVIEW OF SYSTEMS: ROS   Positive for: Endocrine, Eyes Last edited by Etheleen Mayhew, COT on 01/16/2023 12:52 PM.      ALLERGIES No Known Allergies  PAST MEDICAL HISTORY Past Medical History:  Diagnosis Date   A-fib (HCC)    Arthritis    Atrial fibrillation (HCC)    Diabetes mellitus    Dysrhythmia    ED (erectile dysfunction)    Epistaxis    Hyperlipidemia    Hypertension    Past Surgical History:  Procedure Laterality Date   APPENDECTOMY     NASAL MASS EXCISION     NASAL SINUS SURGERY  12/16/2011   Procedure: ENDOSCOPIC SINUS SURGERY;  Surgeon: Drema Halon, MD;  Location: [facility] OR;  Service: ENT;  Laterality: N/A;  for control of epistaxis   TOTAL HIP ARTHROPLASTY Right 04/29/2021   Procedure: TOTAL HIP ARTHROPLASTY ANTERIOR APPROACH;  Surgeon: Samson Frederic, MD;  Location: [facility];  Service: Orthopedics;  Laterality: Right;   FAMILY HISTORY History reviewed. No pertinent family history.  SOCIAL HISTORY Social History   Tobacco Use   Smoking status: Former    Types: Cigarettes    Quit date: 07/25/1990    Years since quitting: 32.5   Smokeless tobacco: Never  Vaping Use   Vaping Use: Never used  Substance Use Topics   Alcohol use: Not Currently   Drug use: No       OPHTHALMIC EXAM:  Base Eye Exam     Visual Acuity (Snellen - Linear)       Right Left   Dist [place] 20/25 -2 20/20         Tonometry (Tonopen, 12:54 PM)       Right Left   Pressure 15 16         Pupils       Pupils Dark Light Shape React APD   Right PERRL 3 2 Round Brisk None   Left PERRL 3 2 Round Brisk None          Visual Fields       Left Right    Full Full         Extraocular Movement       Right Left    Full, Ortho Full, Ortho         Neuro/Psych     Oriented x3: Yes   Mood/Affect: Normal         Dilation     Both eyes: 2.5% Phenylephrine @ 12:54 PM           Slit Lamp and Fundus Exam     Slit Lamp Exam       Right Left   Lids/Lashes Dermatochalasis - upper lid Dermatochalasis - upper lid   Conjunctiva/Sclera Melanosis, nasal and temporal pinguecula Melanosis, nasal and temporal pinguecula   Cornea mild arcus, tear film debris, well healed cataract wound mild arcus, tear film debris, well healed cataract wound   Anterior Chamber deep and clear deep and clear   Iris Round and dilated, mild atrophy temporally, Transillumination defects Round and dilated   Lens 3 piece PC IOL in good position with open PC 3 piece PC IOL in good position with open PC   Anterior Vitreous Vitreous syneresis Vitreous syneresis         Fundus Exam       Right Left   Disc Pink and Sharp, mild hyperemia, +PPP mild Pallor, Sharp rim, mild PPP   C/D Ratio 0.3 0.4   Macula Flat, Blunted foveal reflex, central cystic changes -- improved, focal IRH inferior to fovea, RPE mottling Flat, Blunted foveal reflex, RPE mottling and clumping, No heme or edema   Vessels Attenuated arterioles, dilated and tortuous venules, AV crossing changes attenuated, mild tortuosity   Periphery Attached, 360 MA/DBH greatest temporally -- improving Attached, No heme           IMAGING AND PROCEDURES  Imaging and Procedures for 01/16/2023  OCT, Retina - OU - Both Eyes       Right Eye Quality was good. Central Foveal Thickness: 306. Progression has improved. Findings include normal foveal contour, no IRF, no SRF (Interval resolution of cystic changes superior fovea).   Left Eye Quality was good. Central Foveal Thickness: 297. Progression has been stable. Findings include normal foveal contour, no IRF,  no SRF.   Notes *Images captured and stored on drive  Diagnosis / Impression:  OD: Interval resolution of cystic changes superior fovea OS: NFP,  no IRF/SRF  Clinical management:  See below  Abbreviations: NFP - Normal foveal profile. CME - cystoid macular edema. PED - pigment epithelial detachment. IRF - intraretinal fluid. SRF - subretinal fluid. EZ - ellipsoid zone. ERM - epiretinal membrane. ORA - outer retinal atrophy. ORT - outer retinal tubulation. SRHM - subretinal hyper-reflective material. IRHM - intraretinal hyper-reflective material            ASSESSMENT/PLAN:    ICD-10-CM   1. Central retinal vein occlusion with macular edema of right eye  H34.8110 OCT, Retina - OU - Both Eyes    2. Diabetes mellitus type 2 without retinopathy (HCC)  E11.9     3. Long term (current) use of oral hypoglycemic drugs  Z79.84     4. Essential hypertension  I10     5. Hypertensive retinopathy of both eyes  H35.033     6. Pseudophakia, both eyes  Z96.1       Remote CRVO w/ mild CME OD - BCVA 20/25 OD and pt with minimal symptoms or visual complaints -- stable - exam shows interval improvement in 360 DBH - OCT shows OD: Central IRF/cystic changes superior fovea improved - FA (03.11.24) shows Mild perifoveal petaloid leakage, hyper fluorescence of the disc, mild MA with leakage peripherally -- ?remote RVO - daughter reports BP may have spiked in Feb, when pt's wife was diagnosed w/ gastric cancer - discussed findings and treatment options - no treatment recommended at this time -- will monitor - F/U 6 mos, sooner prn -- DFE/OCT  2,3. Diabetes mellitus, type 2 without retinopathy - The incidence, risk factors for progression, natural history and treatment options for diabetic retinopathy  were discussed with patient.   - The need for close monitoring of blood glucose, blood pressure, and serum lipids, avoiding cigarette or any type of tobacco, and the need for long term follow up  was also discussed with patient.  - FA 03.11.23 shows minimal MA OU - f/u in 1 year, sooner prn  4,5. Hypertensive retinopathy OU - discussed importance of tight BP control - monitor  6. Mixed Cataract OU - The symptoms of cataract, surgical options, and treatments and risks were discussed with patient. - discussed diagnosis and progression - not yet visually significant - monitor for now  Ophthalmic Meds Ordered this visit:  No orders of the defined types were placed in this encounter.    Return in about 6 months (around 07/18/2023) for f/u CRVO OD, DFE, OCT.  There are no Patient Instructions on file for this visit.   Explained the diagnoses, plan, and follow up with the patient and they expressed understanding.  Patient expressed understanding of the importance of proper follow up care.   This document serves as a record of services personally performed by Karie Chimera, MD, PhD. It was created on their behalf by De Blanch, an ophthalmic technician. The creation of this record is the provider's dictation and/or activities during the visit.    Electronically signed by: De Blanch, OA, 01/16/23  1:33 PM  This document serves as a record of services personally performed by Karie Chimera, MD, PhD. It was created on their behalf by Glee Arvin. Manson Passey, OA an ophthalmic technician. The creation of this record is the provider's dictation and/or activities during the visit.    Electronically signed by: Glee Arvin. Manson Passey, [place] 06.24.2024 1:33 PM  Karie Chimera, M.D., Ph.D. Diseases & Surgery of the Retina and Vitreous [facility]  I have reviewed the above documentation for accuracy and completeness, and I agree with the above. Karie Chimera, M.D., Ph.D. 01/16/23 1:34 PM   Abbreviations: M myopia (nearsighted); A astigmatism; H hyperopia (farsighted); P presbyopia; Mrx spectacle prescription;  CTL contact lenses; OD right eye; OS left eye; OU  both eyes  XT exotropia; ET esotropia; PEK punctate epithelial keratitis; PEE punctate epithelial erosions; DES dry eye syndrome; MGD meibomian gland dysfunction; ATs artificial tears; PFAT's preservative free artificial tears; NSC nuclear sclerotic cataract; PSC posterior subcapsular cataract; ERM epi-retinal membrane; PVD posterior vitreous detachment; RD retinal detachment; DM diabetes mellitus; DR diabetic retinopathy; NPDR non-proliferative diabetic retinopathy; PDR proliferative diabetic retinopathy; CSME clinically significant macular edema; DME diabetic macular edema; dbh dot blot hemorrhages; CWS cotton wool spot; POAG primary open angle glaucoma; C/D cup-to-disc ratio; HVF humphrey visual field; GVF goldmann visual field; OCT optical coherence tomography; IOP intraocular pressure; BRVO Branch retinal vein occlusion; CRVO central retinal vein occlusion; CRAO central retinal artery occlusion; BRAO branch retinal artery occlusion; RT retinal tear; SB scleral buckle; PPV pars plana vitrectomy; VH Vitreous hemorrhage; PRP panretinal laser photocoagulation; IVK intravitreal kenalog; VMT vitreomacular traction; MH Macular hole;  NVD neovascularization of the disc; NVE neovascularization elsewhere; AREDS age related eye disease study; ARMD age related macular degeneration; POAG primary open angle glaucoma; EBMD epithelial/anterior basement membrane dystrophy; ACIOL anterior chamber intraocular lens; IOL intraocular lens; PCIOL posterior chamber intraocular lens; Phaco/IOL phacoemulsification with intraocular lens placement; PRK photorefractive keratectomy; LASIK laser assisted in situ keratomileusis; HTN hypertension; DM diabetes mellitus; COPD chronic obstructive pulmonary disease

## 2023-01-16 ENCOUNTER — Ambulatory Visit (INDEPENDENT_AMBULATORY_CARE_PROVIDER_SITE_OTHER): Payer: Medicare HMO | Admitting: Ophthalmology

## 2023-01-16 ENCOUNTER — Encounter (INDEPENDENT_AMBULATORY_CARE_PROVIDER_SITE_OTHER): Payer: Self-pay | Admitting: Ophthalmology

## 2023-01-16 DIAGNOSIS — H34811 Central retinal vein occlusion, right eye, with macular edema: Secondary | ICD-10-CM | POA: Diagnosis not present

## 2023-01-16 DIAGNOSIS — H35033 Hypertensive retinopathy, bilateral: Secondary | ICD-10-CM

## 2023-01-16 DIAGNOSIS — E119 Type 2 diabetes mellitus without complications: Secondary | ICD-10-CM

## 2023-01-16 DIAGNOSIS — Z961 Presence of intraocular lens: Secondary | ICD-10-CM

## 2023-01-16 DIAGNOSIS — Z7984 Long term (current) use of oral hypoglycemic drugs: Secondary | ICD-10-CM

## 2023-01-16 DIAGNOSIS — I10 Essential (primary) hypertension: Secondary | ICD-10-CM | POA: Diagnosis not present

## 2023-01-28 ENCOUNTER — Encounter (HOSPITAL_BASED_OUTPATIENT_CLINIC_OR_DEPARTMENT_OTHER): Payer: Self-pay

## 2023-01-28 ENCOUNTER — Emergency Department (HOSPITAL_BASED_OUTPATIENT_CLINIC_OR_DEPARTMENT_OTHER)
Admission: EM | Admit: 2023-01-28 | Discharge: 2023-01-28 | Disposition: A | Discharge status: Home / Self Care | Payer: Medicare HMO | Attending: Emergency Medicine | Admitting: Emergency Medicine

## 2023-01-28 ENCOUNTER — Emergency Department (HOSPITAL_BASED_OUTPATIENT_CLINIC_OR_DEPARTMENT_OTHER): Payer: Medicare HMO

## 2023-01-28 DIAGNOSIS — S0990XA Unspecified injury of head, initial encounter: Secondary | ICD-10-CM | POA: Diagnosis not present

## 2023-01-28 DIAGNOSIS — Z7901 Long term (current) use of anticoagulants: Secondary | ICD-10-CM | POA: Insufficient documentation

## 2023-01-28 DIAGNOSIS — S060X1A Concussion with loss of consciousness of 30 minutes or less, initial encounter: Secondary | ICD-10-CM

## 2023-01-28 DIAGNOSIS — W01198A Fall on same level from slipping, tripping and stumbling with subsequent striking against other object, initial encounter: Secondary | ICD-10-CM | POA: Diagnosis not present

## 2023-01-28 DIAGNOSIS — S199XXA Unspecified injury of neck, initial encounter: Secondary | ICD-10-CM | POA: Diagnosis not present

## 2023-01-28 DIAGNOSIS — R001 Bradycardia, unspecified: Secondary | ICD-10-CM | POA: Diagnosis not present

## 2023-01-28 DIAGNOSIS — S060X0A Concussion without loss of consciousness, initial encounter: Secondary | ICD-10-CM | POA: Diagnosis not present

## 2023-01-28 DIAGNOSIS — G319 Degenerative disease of nervous system, unspecified: Secondary | ICD-10-CM | POA: Diagnosis not present

## 2023-01-28 DIAGNOSIS — R55 Syncope and collapse: Secondary | ICD-10-CM | POA: Diagnosis not present

## 2023-01-28 LAB — CBC WITH DIFFERENTIAL/PLATELET
Abs Immature Granulocytes: 0.02 10*3/uL (ref 0.00–0.07)
Basophils Absolute: 0 10*3/uL (ref 0.0–0.1)
Basophils Relative: 1 %
Eosinophils Absolute: 0.2 10*3/uL (ref 0.0–0.5)
Eosinophils Relative: 3 %
HCT: 43.2 % (ref 39.0–52.0)
Hemoglobin: 14.6 g/dL (ref 13.0–17.0)
Immature Granulocytes: 0 %
Lymphocytes Relative: 29 %
Lymphs Abs: 1.8 10*3/uL (ref 0.7–4.0)
MCH: 31.5 pg (ref 26.0–34.0)
MCHC: 33.8 g/dL (ref 30.0–36.0)
MCV: 93.1 fL (ref 80.0–100.0)
Monocytes Absolute: 0.4 10*3/uL (ref 0.1–1.0)
Monocytes Relative: 6 %
Neutro Abs: 3.8 10*3/uL (ref 1.7–7.7)
Neutrophils Relative %: 61 %
Platelets: 227 10*3/uL (ref 150–400)
RBC: 4.64 MIL/uL (ref 4.22–5.81)
RDW: 12.7 % (ref 11.5–15.5)
WBC: 6.2 10*3/uL (ref 4.0–10.5)
nRBC: 0 % (ref 0.0–0.2)

## 2023-01-28 LAB — COMPREHENSIVE METABOLIC PANEL
ALT: 16 U/L (ref 0–44)
AST: 18 U/L (ref 15–41)
Albumin: 4.8 g/dL (ref 3.5–5.0)
Alkaline Phosphatase: 85 U/L (ref 38–126)
Anion gap: 6 (ref 5–15)
BUN: 13 mg/dL (ref 8–23)
CO2: 31 mmol/L (ref 22–32)
Calcium: 10.4 mg/dL — ABNORMAL HIGH (ref 8.9–10.3)
Chloride: 103 mmol/L (ref 98–111)
Creatinine, Ser: 0.76 mg/dL (ref 0.61–1.24)
GFR, Estimated: >60 mL/min (ref >60)
Glucose, Bld: 102 mg/dL — ABNORMAL HIGH (ref 70–99)
Potassium: 4.1 mmol/L (ref 3.5–5.1)
Sodium: 140 mmol/L (ref 135–145)
Total Bilirubin: 0.7 mg/dL (ref 0.3–1.2)
Total Protein: 7.4 g/dL (ref 6.5–8.1)

## 2023-01-28 LAB — URINALYSIS, ROUTINE W REFLEX MICROSCOPIC
Bilirubin Urine: NEGATIVE
Glucose, UA: NEGATIVE mg/dL
Hgb urine dipstick: NEGATIVE
Ketones, ur: NEGATIVE mg/dL
Leukocytes,Ua: NEGATIVE
Nitrite: NEGATIVE
Protein, ur: NEGATIVE mg/dL
Specific Gravity, Urine: 1.005 (ref 1.005–1.030)
pH: 7.5 (ref 5.0–8.0)

## 2023-01-28 LAB — TROPONIN I (HIGH SENSITIVITY): Troponin I (High Sensitivity): 5 ng/L (ref <18)

## 2023-01-28 MED ORDER — METOCLOPRAMIDE HCL 10 MG PO TABS
5.0000 mg | ORAL_TABLET | Freq: Once | ORAL | Status: AC
  Administered 2023-01-28: 5 mg via ORAL
  Filled 2023-01-28: qty 1

## 2023-01-28 MED ORDER — METOCLOPRAMIDE HCL 10 MG PO TABS: 5.0000 mg | ORAL_TABLET | Freq: Four times a day (QID) | ORAL | 0 refills | Status: DC | PRN

## 2023-01-28 MED ORDER — METHOCARBAMOL 500 MG PO TABS: 500.0000 mg | ORAL_TABLET | Freq: Three times a day (TID) | ORAL | 0 refills | Status: AC | PRN

## 2023-01-28 MED ORDER — METOCLOPRAMIDE HCL 10 MG PO TABS: 5.0000 mg | ORAL_TABLET | Freq: Four times a day (QID) | ORAL | 0 refills | Status: AC | PRN

## 2023-01-28 MED ORDER — ACETAMINOPHEN 500 MG PO TABS
1000.0000 mg | ORAL_TABLET | Freq: Once | ORAL | Status: AC
  Administered 2023-01-28: 1000 mg via ORAL
  Filled 2023-01-28: qty 2

## 2023-01-28 NOTE — ED Triage Notes (Signed)
Pt states he fell backward on Monday, hitting head. Pt has been taking tylenol. Pt c/o head heaviness. Pt has been taking Tylenol with some relief.

## 2023-01-28 NOTE — ED Provider Notes (Signed)
[place] [facility] AT [facility] Provider Note   CSN: [id number] Arrival date & time: 01/28/23  4098     History  Chief Complaint  Patient presents with   Robert Irwin is a 82 y.o. male with a past medical history of A-fib atrial fibrillation chronically anticoagulated on Xarelto who presents to the emergency department for fall.  History is given by the patient and his daughter at bedside.  They are both currently caring for the patient's wife who has terminal stomach cancer.  They have been caring for her for a long time and she has now at hospice.  This past Monday the patient was putting a feeding into her J-tube and was flushing it when he suddenly passed out and hit the floor.  He states that he woke up and only felt that he needed to get up and finish doing what he was doing for his wife.  He was apparently very diaphoretic and was able to finish the flushing of the J-tube and then sat down immediately.  The patient attributes this to lack of sleep and constant care for his wife.  He denies palpitations and states that he has not noticed any black or tarry stools.  He states that he has atrial fibrillation and is on medication for this.  He sees Dr. Anne Fu.  During evaluation patient has had several episodes of bradycardia into the 30s.  He denies any sensation of near syncope at rest.  He has been otherwise eating and drinking normally and has no previous history of CHF or loss of consciousness.  Patient did hit his head and has had mild persistent headaches and neck stiffness.  No numbness tingling or weakness of the upper extremities   Fall       Home Medications Prior to Admission medications   Medication Sig Start Date End Date Taking? Authorizing Provider  acetaminophen (TYLENOL) 325 MG tablet Take 2 tablets (650 mg total) by mouth every 6 (six) hours as needed for mild pain (or Fever >/= 101). 05/17/21   Rodolph Bong, MD  atorvastatin  (LIPITOR) 20 MG tablet TAKE 1 TABLET EVERY DAY 07/21/22   Jake Bathe, MD  calcium citrate-vitamin D 200-200 MG-UNIT TABS Take 1 tablet by mouth daily.    [provider]  diltiazem (TIADYLT ER) 240 MG 24 hr capsule Take 1 capsule (240 mg total) by mouth daily. 03/24/22   Jake Bathe, MD  glimepiride (AMARYL) 2 MG tablet Take 2 mg by mouth daily before breakfast.    [provider]  Glucosamine-Chondroitin (OSTEO BI-FLEX REGULAR STRENGTH PO) Take 1 tablet by mouth 2 (two) times daily.    [provider]  Multiple Vitamin (MULITIVITAMIN WITH MINERALS) TABS Take 1 tablet by mouth daily.    [provider]  prochlorperazine (COMPAZINE) 10 MG tablet Take 1 tablet (10 mg total) by mouth every 6 (six) hours as needed for nausea or vomiting. 05/09/21   Rodolph Bong, MD  rivaroxaban (XARELTO) 20 MG TABS tablet Take 1 tablet (20 mg total) by mouth daily with supper. 08/16/22   Jake Bathe, MD  traMADol (ULTRAM) 50 MG tablet Take 1 tablet (50 mg total) by mouth every 6 (six) hours as needed for moderate pain. 05/09/21   Rodolph Bong, MD      Allergies    Patient has no known allergies.    Review of Systems   Review of Systems  Physical Exam  Updated Vital Signs BP (!) 144/81   Pulse 84   Temp 98.2 F (36.8 C) (Oral)   Resp 17   Ht 5\' 7"  (1.702 m)   Wt 68.9 kg   SpO2 99%   BMI 23.81 kg/m  Physical Exam Vitals and nursing note reviewed.  Constitutional:      General: He is not in acute distress.    Appearance: He is well-developed. He is not diaphoretic.  HENT:     Head: Normocephalic and atraumatic.  Eyes:     General: No scleral icterus.    Conjunctiva/sclera: Conjunctivae normal.  Cardiovascular:     Rate and Rhythm: Normal rate and regular rhythm.     Heart sounds: Normal heart sounds.  Pulmonary:     Effort: Pulmonary effort is normal. No respiratory distress.     Breath sounds: Normal breath sounds.  Abdominal:      Palpations: Abdomen is soft.     Tenderness: There is no abdominal tenderness.  Musculoskeletal:     Cervical back: Normal range of motion and neck supple.  Skin:    General: Skin is warm and dry.  Neurological:     Mental Status: He is alert.     Comments: Speech is clear and goal oriented, follows commands Major Cranial nerves without deficit, no facial droop Normal strength in upper and lower extremities bilaterally including dorsiflexion and plantar flexion, strong and equal grip strength Sensation normal to light and sharp touch Moves extremities without ataxia, coordination intact Normal finger to nose and rapid alternating movements Neg romberg, no pronator drift Normal gait Normal heel-shin and balance   Psychiatric:        Behavior: Behavior normal.     ED Results / Procedures / Treatments   Labs (all labs ordered are listed, but only abnormal results are displayed) Labs Reviewed - No data to display  EKG None  Radiology CT Head Wo Contrast  Result Date: 01/28/2023 CLINICAL DATA:  Minor head trauma EXAM: CT HEAD WITHOUT CONTRAST CT CERVICAL SPINE WITHOUT CONTRAST TECHNIQUE: Multidetector CT imaging of the head and cervical spine was performed following the standard protocol without intravenous contrast. Multiplanar CT image reconstructions of the cervical spine were also generated. RADIATION DOSE REDUCTION: This exam was performed according to the departmental dose-optimization program which includes automated exposure control, adjustment of the mA and/or kV according to patient size and/or use of iterative reconstruction technique. COMPARISON:  None Available. FINDINGS: CT HEAD FINDINGS Brain: No evidence of acute infarction, hemorrhage, hydrocephalus, extra-axial collection or mass lesion/mass effect. Generalized atrophy. Vascular: No hyperdense vessel or unexpected calcification. Skull: Normal. Negative for fracture or focal lesion. Sinuses/Orbits: No acute finding. CT  CERVICAL SPINE FINDINGS Alignment: Normal. Skull base and vertebrae: No acute fracture. No primary bone lesion or focal pathologic process. Soft tissues and spinal canal: No prevertebral fluid or swelling. No visible canal hematoma. Disc levels: Bulky ventral spondylitic spurring compatible with diffuse idiopathic skeletal hyperostosis. Osteophyte at C4-5 is prominent in size but the spinal canal still appears patent. Foraminal stenosis asymmetric to the right at C3-4 to C5-6. Upper chest: Negative for acute finding. Biapical pleural based scarring. IMPRESSION: No evidence of acute intracranial or cervical spine injury. Electronically Signed   By: Tiburcio Pea M.D.   On: 01/28/2023 11:53   CT Cervical Spine Wo Contrast  Result Date: 01/28/2023 CLINICAL DATA:  Minor head trauma EXAM: CT HEAD WITHOUT CONTRAST CT CERVICAL SPINE WITHOUT CONTRAST TECHNIQUE: Multidetector CT imaging of the head and cervical spine  was performed following the standard protocol without intravenous contrast. Multiplanar CT image reconstructions of the cervical spine were also generated. RADIATION DOSE REDUCTION: This exam was performed according to the departmental dose-optimization program which includes automated exposure control, adjustment of the mA and/or kV according to patient size and/or use of iterative reconstruction technique. COMPARISON:  None Available. FINDINGS: CT HEAD FINDINGS Brain: No evidence of acute infarction, hemorrhage, hydrocephalus, extra-axial collection or mass lesion/mass effect. Generalized atrophy. Vascular: No hyperdense vessel or unexpected calcification. Skull: Normal. Negative for fracture or focal lesion. Sinuses/Orbits: No acute finding. CT CERVICAL SPINE FINDINGS Alignment: Normal. Skull base and vertebrae: No acute fracture. No primary bone lesion or focal pathologic process. Soft tissues and spinal canal: No prevertebral fluid or swelling. No visible canal hematoma. Disc levels: Bulky ventral  spondylitic spurring compatible with diffuse idiopathic skeletal hyperostosis. Osteophyte at C4-5 is prominent in size but the spinal canal still appears patent. Foraminal stenosis asymmetric to the right at C3-4 to C5-6. Upper chest: Negative for acute finding. Biapical pleural based scarring. IMPRESSION: No evidence of acute intracranial or cervical spine injury. Electronically Signed   By: Tiburcio Pea M.D.   On: 01/28/2023 11:53    Procedures Procedures    Medications Ordered in ED Medications - No data to display  ED Course/ Medical Decision Making/ A&P                             Medical Decision Making Amount and/or Complexity of Data Reviewed Labs: ordered. Radiology: ordered. ECG/medicine tests: ordered.  Risk OTC drugs. Prescription drug management.   This patient presents to the ED for concern of loc, this involves an extensive number of treatment options, and is a complaint that carries with it a high risk of complications and morbidity.   The differential for syncope is extensive and includes, but is not limited to: arrythmia (Vtach, SVT, SSS, sinus arrest, AV block, bradycardia) aortic stenosis, AMI, HOCM, PE, atrial myxoma, pulmonary hypertension, orthostatic hypotension, (hypovolemia, drug effect, GB syndrome, micturition, cough, swall) carotid sinus sensitivity, Seizure, TIA/CVA, hypoglycemia,  Vertigo.   Co morbidities: .pmh  Social Determinants of Health:   has a past medical history of A-fib (HCC), Arthritis, Atrial fibrillation (HCC), Diabetes mellitus, Dysrhythmia, ED (erectile dysfunction), Epistaxis, Hyperlipidemia, and Hypertension.   Additional history:  Digital history obtained from daughter at bedside {External records from outside source obtained and reviewed including cardiology notes  Lab Tests:  I Ordered, and personally interpreted labs.  The pertinent results include: Urinalysis, CBC, troponin, CMP. Review of labs show no evidence of an  urinary tract infection, CBC and CMP without significant abnormality.  Troponin within normal limits.  Imaging Studies:  I ordered imaging studies including CT head and C-spine I independently visualized and interpreted imaging which showed no acute findings I agree with the radiologist interpretation  Cardiac Monitoring/ECG:  The patient was maintained on a cardiac monitor.  I personally viewed and interpreted the cardiac monitored which showed an underlying rhythm of:   Medicines ordered and prescription drug management:  I ordered medication including  Medications  acetaminophen (TYLENOL) tablet 1,000 mg (1,000 mg Oral Given 01/28/23 1358)  metoCLOPramide (REGLAN) tablet 5 mg (5 mg Oral Given 01/28/23 1400)   for headache  I have reviewed the patients home medicines and have made adjustments as needed  Test Considered:    Critical Interventions:    Consultations Obtained:   Problem List / ED Course:  ICD-10-CM   1. Syncope and collapse  R55 Ambulatory referral to Cardiology    2. Bradycardia  R00.1 Ambulatory referral to Cardiology    Ambulatory referral to Cardiology    3. Concussion with loss of consciousness of 30 minutes or less, initial encounter  S06.0X1A       MDM: Here with syncopal event 1 week ago.  He has had no recurrence of this.  He has a history of atrial fibrillation.  He does not appear to have any blood loss anemia.  He has had several episodes of bradycardia without symptoms here in the emergency department.  Given the current circumstances for the patient and his family and with his wife and hospice his immediate concern is to be with his wife who is in the process of dying.  I discussed his need for very close follow-up with cardiology given his syncopal event, history of A-fib and some episodes of bradycardia.  He will likely need some monitoring and reevaluation.  Discussed return precautions with the patient.  Regarding his headache I think he  is having postconcussive syndrome versus cervicogenic headache from injury.  His images are reassuring.  Will treat with Reglan and his usual Tylenol.  Will also give some Robaxin to use at bedtime for neck pain.  Strict return precautions and close outpatient follow-up were discussed with the patient and his daughter at bedside.   Dispostion:  After consideration of the diagnostic results and the patients response to treatment, I feel that the patent would benefit from discharge.         Final Clinical Impression(s) / ED Diagnoses Final diagnoses:  Syncope and collapse  Bradycardia  Concussion with loss of consciousness of 30 minutes or less, initial encounter    Rx / DC Orders ED Discharge Orders     None         Arthor Captain, PA-C 01/28/23 1542    Alvira Monday, MD 01/28/23 2053

## 2023-01-28 NOTE — Discharge Instructions (Signed)
  Contact a health care provider if: You have episodes of near fainting. Get help right away if: You faint. You hit your head or are injured after fainting. You have any of these symptoms that may indicate trouble with your heart: Fast or irregular heartbeats (palpitations). Unusual pain in your chest, abdomen, or back. Shortness of breath. You have a seizure. You have a severe headache. You are confused. You have vision problems. You have severe weakness or trouble walking. You are bleeding from your mouth or rectum, or you have black or tarry stool. These symptoms may represent a serious problem that is an emergency. Do not wait to see if your symptoms will go away. Get medical help right away. Call your local emergency services (911 in the [place] Do not drive yourself to the hospital. 

## 2023-02-28 ENCOUNTER — Ambulatory Visit: Payer: Medicare HMO | Admitting: Cardiology

## 2023-02-28 ENCOUNTER — Encounter: Payer: Self-pay | Admitting: Cardiology

## 2023-02-28 VITALS — BP 118/68 | HR 82 | Ht 67.0 in | Wt 156.0 lb

## 2023-02-28 DIAGNOSIS — I4821 Permanent atrial fibrillation: Secondary | ICD-10-CM

## 2023-02-28 DIAGNOSIS — E785 Hyperlipidemia, unspecified: Secondary | ICD-10-CM | POA: Diagnosis not present

## 2023-02-28 NOTE — Progress Notes (Signed)
  Cardiology Office Note:  .   Date:  02/28/2023  ID:  Yuxuan, Spadaro Aug 07, 1940, MRN [id number] PCP: Georgann Housekeeper, MD  [facility] Providers Cardiologist:  Donato Schultz, MD    History of Present Illness: .   Robert Irwin is a 82 y.o. male here for follow-up permanent atrial fibrillation, longstanding for several years.  Overall been doing fairly well with rate control.  He went to the emergency room on 01/28/2023 after a fall.  Unfortunately his wife had terminal stomach cancer on hospice.  He was helping her J-tube and flushing it when he suddenly passed out on the floor.  Diaphoretic.  Fatigue.  EKG unremarkable in ER with atrial fibrillation unchanged.  He has had in the past episodes of bradycardia.  Sounds to me like a vasovagal type episode.  No recurrence.  Right hip replacement in 2022.  ROS: Some trouble with insomnia.  Studies Reviewed: Marland Kitchen           No recent echocardiogram. Risk Assessment/Calculations:            Physical Exam:   VS:  BP 118/68   Pulse 82   Ht 5\' 7"  (1.702 m)   Wt 156 lb (70.8 kg)   SpO2 96%   BMI 24.43 kg/m    Wt Readings from Last 3 Encounters:  02/28/23 156 lb (70.8 kg)  01/28/23 152 lb (68.9 kg)  02/14/22 150 lb (68 kg)    GEN: Well nourished, well developed in no acute distress NECK: No JVD; No carotid bruits CARDIAC: Irreg, no murmurs, rubs, gallops RESPIRATORY:  Clear to auscultation without rales, wheezing or rhonchi  ABDOMEN: Soft, non-tender, non-distended EXTREMITIES:  No edema; No deformity   ASSESSMENT AND PLAN: .    Longstanding persistent atrial fibrillation - Overall well rate controlled diltiazem 240 mg long-acting.  Syncope - Likely vasovagal in the setting of fatigue while helping to care for his wife on hospice.  No recurrence.  If syncope returns, check a ZIO monitor to ensure that he is not having any significant pauses with his underlying atrial fibrillation.  Chronic anticoagulation - Xarelto 20 mg,  hemoglobin 14.1 creatinine 0.7.  Diabetes with hyperlipidemia - On atorvastatin 20 mg a day with LDL of 88 for prevention.  Prior hemoglobin A1c 6.3.  Gout - Stable on colchicine.  Hip replacement 2022 - Stable  Grieving - Wife died from stomach cancer.  She was on hospice.  He did ask about assistance with sleeping.  I did suggest he talk with Robert Irwin.  He had mentioned that he had given him a one-time prescription.  Perhaps with his exercise now, this will help.      Dispo: 1 year  Signed, Donato Schultz, MD

## 2023-02-28 NOTE — Patient Instructions (Signed)

## 2023-03-21 ENCOUNTER — Other Ambulatory Visit (HOSPITAL_COMMUNITY): Payer: Self-pay | Admitting: Internal Medicine

## 2023-03-21 DIAGNOSIS — R519 Headache, unspecified: Secondary | ICD-10-CM

## 2023-03-21 DIAGNOSIS — S0990XA Unspecified injury of head, initial encounter: Secondary | ICD-10-CM | POA: Diagnosis not present

## 2023-03-21 DIAGNOSIS — E113311 Type 2 diabetes mellitus with moderate nonproliferative diabetic retinopathy with macular edema, right eye: Secondary | ICD-10-CM | POA: Diagnosis not present

## 2023-03-21 DIAGNOSIS — W19XXXA Unspecified fall, initial encounter: Secondary | ICD-10-CM | POA: Diagnosis not present

## 2023-03-21 DIAGNOSIS — M542 Cervicalgia: Secondary | ICD-10-CM | POA: Diagnosis not present

## 2023-03-22 ENCOUNTER — Ambulatory Visit (HOSPITAL_COMMUNITY)
Admission: RE | Admit: 2023-03-22 | Discharge: 2023-03-22 | Disposition: A | Discharge status: Home / Self Care | Payer: Medicare HMO | Source: Ambulatory Visit | Attending: Internal Medicine | Admitting: Internal Medicine

## 2023-03-22 DIAGNOSIS — I6782 Cerebral ischemia: Secondary | ICD-10-CM | POA: Diagnosis not present

## 2023-03-22 DIAGNOSIS — R519 Headache, unspecified: Secondary | ICD-10-CM | POA: Insufficient documentation

## 2023-04-01 ENCOUNTER — Other Ambulatory Visit: Payer: Self-pay | Admitting: Cardiology

## 2023-04-01 DIAGNOSIS — I4821 Permanent atrial fibrillation: Secondary | ICD-10-CM

## 2023-04-03 NOTE — Telephone Encounter (Signed)
Prescription refill request for Xarelto received.  Indication:afib Last office visit:8/24 Weight:70.8  kg Age:82 Scr:0.76  7/24 CrCl:76.34  ml/min  Prescription refilled

## 2023-04-14 ENCOUNTER — Other Ambulatory Visit: Payer: Self-pay | Admitting: Cardiology

## 2023-04-15 ENCOUNTER — Emergency Department (HOSPITAL_BASED_OUTPATIENT_CLINIC_OR_DEPARTMENT_OTHER): Payer: Medicare HMO

## 2023-04-15 ENCOUNTER — Emergency Department (HOSPITAL_BASED_OUTPATIENT_CLINIC_OR_DEPARTMENT_OTHER)
Admission: EM | Admit: 2023-04-15 | Discharge: 2023-04-15 | Disposition: A | Discharge status: Home / Self Care | Payer: Medicare HMO | Attending: Emergency Medicine | Admitting: Emergency Medicine

## 2023-04-15 ENCOUNTER — Encounter (HOSPITAL_BASED_OUTPATIENT_CLINIC_OR_DEPARTMENT_OTHER): Payer: Self-pay

## 2023-04-15 ENCOUNTER — Other Ambulatory Visit: Payer: Self-pay

## 2023-04-15 DIAGNOSIS — Z7984 Long term (current) use of oral hypoglycemic drugs: Secondary | ICD-10-CM | POA: Insufficient documentation

## 2023-04-15 DIAGNOSIS — Z7901 Long term (current) use of anticoagulants: Secondary | ICD-10-CM | POA: Diagnosis not present

## 2023-04-15 DIAGNOSIS — K4020 Bilateral inguinal hernia, without obstruction or gangrene, not specified as recurrent: Secondary | ICD-10-CM | POA: Diagnosis not present

## 2023-04-15 DIAGNOSIS — E119 Type 2 diabetes mellitus without complications: Secondary | ICD-10-CM | POA: Diagnosis not present

## 2023-04-15 DIAGNOSIS — K8020 Calculus of gallbladder without cholecystitis without obstruction: Secondary | ICD-10-CM | POA: Diagnosis not present

## 2023-04-15 DIAGNOSIS — K529 Noninfective gastroenteritis and colitis, unspecified: Secondary | ICD-10-CM | POA: Diagnosis not present

## 2023-04-15 DIAGNOSIS — R109 Unspecified abdominal pain: Secondary | ICD-10-CM | POA: Diagnosis not present

## 2023-04-15 DIAGNOSIS — R1084 Generalized abdominal pain: Secondary | ICD-10-CM | POA: Diagnosis present

## 2023-04-15 DIAGNOSIS — I4891 Unspecified atrial fibrillation: Secondary | ICD-10-CM | POA: Diagnosis not present

## 2023-04-15 LAB — COMPREHENSIVE METABOLIC PANEL
ALT: 14 U/L (ref 0–44)
AST: 18 U/L (ref 15–41)
Albumin: 4.3 g/dL (ref 3.5–5.0)
Alkaline Phosphatase: 92 U/L (ref 38–126)
Anion gap: 7 (ref 5–15)
BUN: 17 mg/dL (ref 8–23)
CO2: 26 mmol/L (ref 22–32)
Calcium: 9.3 mg/dL (ref 8.9–10.3)
Chloride: 102 mmol/L (ref 98–111)
Creatinine, Ser: 0.73 mg/dL (ref 0.61–1.24)
GFR, Estimated: >60 mL/min (ref >60)
Glucose, Bld: 128 mg/dL — ABNORMAL HIGH (ref 70–99)
Potassium: 4.8 mmol/L (ref 3.5–5.1)
Sodium: 135 mmol/L (ref 135–145)
Total Bilirubin: 0.9 mg/dL (ref 0.3–1.2)
Total Protein: 7 g/dL (ref 6.5–8.1)

## 2023-04-15 LAB — CBC
HCT: 40.9 % (ref 39.0–52.0)
Hemoglobin: 14 g/dL (ref 13.0–17.0)
MCH: 32.2 pg (ref 26.0–34.0)
MCHC: 34.2 g/dL (ref 30.0–36.0)
MCV: 94 fL (ref 80.0–100.0)
Platelets: 205 10*3/uL (ref 150–400)
RBC: 4.35 MIL/uL (ref 4.22–5.81)
RDW: 13 % (ref 11.5–15.5)
WBC: 7.1 10*3/uL (ref 4.0–10.5)
nRBC: 0 % (ref 0.0–0.2)

## 2023-04-15 LAB — LIPASE, BLOOD: Lipase: 42 U/L (ref 11–51)

## 2023-04-15 MED ORDER — AMOXICILLIN-POT CLAVULANATE 875-125 MG PO TABS
1.0000 | ORAL_TABLET | Freq: Once | ORAL | Status: AC
  Administered 2023-04-15: 1 via ORAL
  Filled 2023-04-15: qty 1

## 2023-04-15 MED ORDER — AMOXICILLIN-POT CLAVULANATE 875-125 MG PO TABS: 1.0000 | ORAL_TABLET | Freq: Two times a day (BID) | ORAL | 0 refills | Status: AC

## 2023-04-15 MED ORDER — ONDANSETRON 4 MG PO TBDP: 4.0000 mg | ORAL_TABLET | Freq: Three times a day (TID) | ORAL | 0 refills | Status: AC | PRN

## 2023-04-15 MED ORDER — FENTANYL CITRATE PF 50 MCG/ML IJ SOSY
50.0000 ug | PREFILLED_SYRINGE | Freq: Once | INTRAMUSCULAR | Status: AC
  Administered 2023-04-15: 50 ug via INTRAVENOUS
  Filled 2023-04-15: qty 1

## 2023-04-15 MED ORDER — SODIUM CHLORIDE 0.9 % IV BOLUS
1000.0000 mL | Freq: Once | INTRAVENOUS | Status: AC
  Administered 2023-04-15: 1000 mL via INTRAVENOUS

## 2023-04-15 MED ORDER — IOHEXOL 300 MG/ML  SOLN
100.0000 mL | Freq: Once | INTRAMUSCULAR | Status: AC | PRN
  Administered 2023-04-15: 100 mL via INTRAVENOUS

## 2023-04-15 NOTE — ED Provider Notes (Signed)
EMERGENCY DEPARTMENT AT [facility] Provider Note   CSN: [id number] Arrival date & time: 04/15/23  1713     History  Chief Complaint  Patient presents with   Abdominal Pain    Robert Irwin is a 82 y.o. male.  Patient with abdominal pain and some loose stools.  Started tonight.  Mostly lower abdomen.  History of atrial fibrillation, high cholesterol, diabetes.  Nothing makes it worse or better.  Denies any fever or chills.  No nausea or vomiting.  Felt a little nauseous earlier today but feeling better.  Has been able to tolerate p.o.  Denies any pain urination.  No diverticulitis history.  The history is provided by the patient.       Home Medications Prior to Admission medications   Medication Sig Start Date End Date Taking? Authorizing Provider  amoxicillin-clavulanate (AUGMENTIN) 875-125 MG tablet Take 1 tablet by mouth every 12 (twelve) hours. 04/15/23  Yes Deontay Ladnier, DO  ondansetron (ZOFRAN-ODT) 4 MG disintegrating tablet Take 1 tablet (4 mg total) by mouth every 8 (eight) hours as needed. 04/15/23  Yes Luba Matzen, DO  acetaminophen (TYLENOL) 325 MG tablet Take 2 tablets (650 mg total) by mouth every 6 (six) hours as needed for mild pain (or Fever >/= 101). 05/17/21   Rodolph Bong, MD  atorvastatin (LIPITOR) 20 MG tablet TAKE 1 TABLET EVERY DAY 07/21/22   Jake Bathe, MD  calcium citrate-vitamin D 200-200 MG-UNIT TABS Take 1 tablet by mouth daily.    [provider]  glimepiride (AMARYL) 2 MG tablet Take 2 mg by mouth daily before breakfast.    [provider]  Glucosamine-Chondroitin (OSTEO BI-FLEX REGULAR STRENGTH PO) Take 1 tablet by mouth 2 (two) times daily.    [provider]  methocarbamol (ROBAXIN) 500 MG tablet Take 1 tablet (500 mg total) by mouth 3 (three) times daily as needed for muscle spasms. 01/28/23   Arthor Captain, PA-C  metoCLOPramide (REGLAN) 10 MG tablet Take 0.5 tablets (5 mg total) by mouth  every 6 (six) hours as needed for nausea (nausea/headache). 01/28/23   Arthor Captain, PA-C  Multiple Vitamin (MULITIVITAMIN WITH MINERALS) TABS Take 1 tablet by mouth daily.    [provider]  prochlorperazine (COMPAZINE) 10 MG tablet Take 1 tablet (10 mg total) by mouth every 6 (six) hours as needed for nausea or vomiting. 05/09/21   Rodolph Bong, MD  TIADYLT ER 240 MG 24 hr capsule TAKE 1 CAPSULE EVERY DAY 04/14/23   Jake Bathe, MD  traMADol (ULTRAM) 50 MG tablet Take 1 tablet (50 mg total) by mouth every 6 (six) hours as needed for moderate pain. 05/09/21   Rodolph Bong, MD  XARELTO 20 MG TABS tablet TAKE 1 TABLET EVERY DAY WITH SUPPER 04/03/23   Jake Bathe, MD      Allergies    Patient has no known allergies.    Review of Systems   Review of Systems  Physical Exam Updated Vital Signs BP (!) 141/82   Pulse (!) 104   Temp 99.3 F (37.4 C) (Oral)   Resp 13   SpO2 98%  Physical Exam Vitals and nursing note reviewed.  Constitutional:      General: He is not in acute distress.    Appearance: He is well-developed. He is not ill-appearing.  HENT:     Head: Normocephalic and atraumatic.  Eyes:     Extraocular Movements: Extraocular movements intact.     Conjunctiva/sclera:  Conjunctivae normal.  Cardiovascular:     Rate and Rhythm: Normal rate and regular rhythm.     Heart sounds: Normal heart sounds. No murmur heard. Pulmonary:     Effort: Pulmonary effort is normal. No respiratory distress.     Breath sounds: Normal breath sounds.  Abdominal:     General: Abdomen is flat.     Palpations: Abdomen is soft.     Tenderness: There is generalized abdominal tenderness.  Musculoskeletal:        General: No swelling.     Cervical back: Neck supple.  Skin:    General: Skin is warm and dry.     Capillary Refill: Capillary refill takes less than 2 seconds.  Neurological:     General: No focal deficit present.     Mental Status: He is alert.  Psychiatric:         Mood and Affect: Mood normal.     ED Results / Procedures / Treatments   Labs (all labs ordered are listed, but only abnormal results are displayed) Labs Reviewed  COMPREHENSIVE METABOLIC PANEL - Abnormal; Notable for the following components:      Result Value   Glucose, Bld 128 (*)    All other components within normal limits  LIPASE, BLOOD  CBC    EKG None  Radiology CT ABDOMEN PELVIS W CONTRAST  Result Date: 04/15/2023 CLINICAL DATA:  Left-sided abdominal pain. EXAM: CT ABDOMEN AND PELVIS WITH CONTRAST TECHNIQUE: Multidetector CT imaging of the abdomen and pelvis was performed using the standard protocol following bolus administration of intravenous contrast. RADIATION DOSE REDUCTION: This exam was performed according to the departmental dose-optimization program which includes automated exposure control, adjustment of the mA and/or kV according to patient size and/or use of iterative reconstruction technique. CONTRAST:  OMNIPAQUE IOHEXOL 300 MG/ML  SOLN COMPARISON:  May 07, 2021 FINDINGS: Lower chest: No acute abnormality. Hepatobiliary: There is diffuse fatty infiltration of the liver parenchyma. No focal liver abnormality is seen. Subcentimeter gallstones are seen within the lumen of an otherwise normal-appearing gallbladder. There is no evidence of biliary dilatation. Pancreas: Unremarkable. No pancreatic ductal dilatation or surrounding inflammatory changes. Spleen: Normal in size without focal abnormality. Adrenals/Urinary Tract: Adrenal glands are unremarkable. Kidneys are normal in size, without renal calculi or hydronephrosis. A stable subcentimeter cystic appearing lesion is seen within the lower pole of the right kidney (approximately 8.92 Hounsfield units). Bladder is unremarkable. Stomach/Bowel: Stomach is within normal limits. The appendix is surgically absent. No evidence of bowel dilatation. Segments of markedly thickened and inflamed small bowel are seen  within the medial aspects of the bilateral lower quadrants (axial CT images 55 through 66, CT series 2). Vascular/Lymphatic: Aortic atherosclerosis. No enlarged abdominal or pelvic lymph nodes. Reproductive: Prostate is unremarkable. Other: Small, bilateral fat containing inguinal hernias are seen. No abdominopelvic ascites. Musculoskeletal: A total right hip replacement is seen with associated streak artifact and subsequently limited evaluation of the adjacent osseous and soft tissue structures. Marked severity multilevel degenerative changes are seen throughout the lumbar spine. IMPRESSION: 1. Marked severity enteritis involving segments of small bowel within the bilateral lower quadrants. 2. Cholelithiasis. 3. Fatty liver. 4. Small, bilateral fat containing inguinal hernias. 5. Total right hip replacement. 6. Aortic atherosclerosis. Aortic Atherosclerosis (ICD10-I70.0). Electronically Signed   By: Aram Candela M.D.   On: 04/15/2023 19:59    Procedures Procedures    Medications Ordered in ED Medications  iohexol (OMNIPAQUE) 300 MG/ML solution 100 mL (100 mLs Intravenous Contrast Given  04/15/23 1900)  sodium chloride 0.9 % bolus 1,000 mL (1,000 mLs Intravenous New Bag/Given 04/15/23 2010)  fentaNYL (SUBLIMAZE) injection 50 mcg (50 mcg Intravenous Given 04/15/23 2010)  amoxicillin-clavulanate (AUGMENTIN) 875-125 MG per tablet 1 tablet (1 tablet Oral Given 04/15/23 2019)    ED Course/ Medical Decision Making/ A&P                                 Medical Decision Making Amount and/or Complexity of Data Reviewed Labs: ordered. Radiology: ordered.  Risk Prescription drug management.   Nelida Meuse is here with nausea nominal pain and may be loose stools.  Unremarkable vitals.  No fever.  No recent illness.  No recent antibiotics.  Differential diagnosis includes diverticulitis/colitis/enteritis/less likely bowel obstruction, UTI.  CBC, CMP, lipase, CT scan abdomen pelvis initiated.  Lab work  overall unremarkable per my review interpretation of labs.  No significant anemia electrolyte abnormality kidney injury or leukocytosis.  CT scan shows changes consistent with enteritis.  He was able to tolerate p.o.  He is feeling comfortable.  Will prescribe him Zofran and Augmentin.  He understands return precautions.  Discharged in good condition.  Has follow-up already in place with primary care doctor this week as he has a general appointment with them.  This chart was dictated using voice recognition software.  Despite best efforts to proofread,  errors can occur which can change the documentation meaning.         Final Clinical Impression(s) / ED Diagnoses Final diagnoses:  Enteritis    Rx / DC Orders ED Discharge Orders          Ordered    ondansetron (ZOFRAN-ODT) 4 MG disintegrating tablet  Every 8 hours PRN        04/15/23 2016    amoxicillin-clavulanate (AUGMENTIN) 875-125 MG tablet  Every 12 hours        04/15/23 2016              Virgina Norfolk, DO 04/15/23 2105

## 2023-04-15 NOTE — ED Notes (Signed)
Pt teaching provided on medications that may cause drowsiness. Pt instructed not to drive or operate heavy machinery while taking the prescribed medication. Pt verbalized understanding.   Pt provided discharge instructions and prescription information. Pt was given the opportunity to ask questions and questions were answered.   

## 2023-04-15 NOTE — ED Notes (Signed)
Pt unable to provide a urine sample at this time. Specimen cup at bedside.

## 2023-04-15 NOTE — ED Triage Notes (Signed)
He c/o left sided abd. Discomfort today. He had two bm's today, one firm and one soft. He is in no distress.

## 2023-04-17 DIAGNOSIS — E782 Mixed hyperlipidemia: Secondary | ICD-10-CM | POA: Diagnosis not present

## 2023-04-17 DIAGNOSIS — I4821 Permanent atrial fibrillation: Secondary | ICD-10-CM | POA: Diagnosis not present

## 2023-04-17 DIAGNOSIS — N400 Enlarged prostate without lower urinary tract symptoms: Secondary | ICD-10-CM | POA: Diagnosis not present

## 2023-04-17 DIAGNOSIS — M109 Gout, unspecified: Secondary | ICD-10-CM | POA: Diagnosis not present

## 2023-04-17 DIAGNOSIS — K529 Noninfective gastroenteritis and colitis, unspecified: Secondary | ICD-10-CM | POA: Diagnosis not present

## 2023-04-17 DIAGNOSIS — I10 Essential (primary) hypertension: Secondary | ICD-10-CM | POA: Diagnosis not present

## 2023-04-17 DIAGNOSIS — E113311 Type 2 diabetes mellitus with moderate nonproliferative diabetic retinopathy with macular edema, right eye: Secondary | ICD-10-CM | POA: Diagnosis not present

## 2023-04-17 DIAGNOSIS — D6869 Other thrombophilia: Secondary | ICD-10-CM | POA: Diagnosis not present

## 2023-04-17 DIAGNOSIS — Z23 Encounter for immunization: Secondary | ICD-10-CM | POA: Diagnosis not present

## 2023-06-13 DIAGNOSIS — Z96641 Presence of right artificial hip joint: Secondary | ICD-10-CM | POA: Diagnosis not present

## 2023-06-13 DIAGNOSIS — Z471 Aftercare following joint replacement surgery: Secondary | ICD-10-CM | POA: Diagnosis not present

## 2023-06-13 DIAGNOSIS — M545 Low back pain, unspecified: Secondary | ICD-10-CM | POA: Diagnosis not present

## 2023-06-23 ENCOUNTER — Other Ambulatory Visit: Payer: Self-pay | Admitting: Cardiology

## 2023-07-04 NOTE — Progress Notes (Signed)
[facility] - Clinic Note  07/10/2023     CHIEF COMPLAINT Patient presents for Retina Follow Up   HISTORY OF PRESENT ILLNESS: Robert Irwin is a 82 y.o. male who presents to the clinic today for:   HPI     Retina Follow Up   Patient presents with  CRVO/BRVO.  In right eye.  Severity is moderate.  Duration of 6 months.  Since onset it is stable.  I, the attending physician,  performed the HPI with the patient and updated documentation appropriately.        Comments   Pt here for 6 mo ret f/u CRVO OD. Pt states VA is maybe a little weaker than before, eyes get tired by end of day. Does not wear rx specs, just readers. Denies any eye drop use. Sugar levels have been in the 130s, not sure of recent A1C.       Last edited by Rennis Chris, MD on 07/10/2023 11:42 PM.    Pt states he felt like his vision was poor before he came here today, he states he is taking something to help him sleep  Referring physician: Diona Foley, MD [address] [place],  [place] [address]  HISTORICAL INFORMATION:   Selected notes from the MEDICAL RECORD NUMBER Referred by Dr. Zenaida Niece for macular edema OD LEE:  Ocular Hx- PMH-    CURRENT MEDICATIONS: No current outpatient medications on file. (Ophthalmic Drugs)   No current facility-administered medications for this visit. (Ophthalmic Drugs)   Current Outpatient Medications (Other)  Medication Sig   acetaminophen (TYLENOL) 325 MG tablet Take 2 tablets (650 mg total) by mouth every 6 (six) hours as needed for mild pain (or Fever >/= 101).   amoxicillin-clavulanate (AUGMENTIN) 875-125 MG tablet Take 1 tablet by mouth every 12 (twelve) hours.   atorvastatin (LIPITOR) 20 MG tablet TAKE 1 TABLET EVERY DAY (NEED MD APPOINTMENT)   calcium citrate-vitamin D 200-200 MG-UNIT TABS Take 1 tablet by mouth daily.   glimepiride (AMARYL) 2 MG tablet Take 2 mg by mouth daily before breakfast.   Glucosamine-Chondroitin (OSTEO BI-FLEX  REGULAR STRENGTH PO) Take 1 tablet by mouth 2 (two) times daily.   methocarbamol (ROBAXIN) 500 MG tablet Take 1 tablet (500 mg total) by mouth 3 (three) times daily as needed for muscle spasms.   metoCLOPramide (REGLAN) 10 MG tablet Take 0.5 tablets (5 mg total) by mouth every 6 (six) hours as needed for nausea (nausea/headache).   Multiple Vitamin (MULITIVITAMIN WITH MINERALS) TABS Take 1 tablet by mouth daily.   ondansetron (ZOFRAN-ODT) 4 MG disintegrating tablet Take 1 tablet (4 mg total) by mouth every 8 (eight) hours as needed.   prochlorperazine (COMPAZINE) 10 MG tablet Take 1 tablet (10 mg total) by mouth every 6 (six) hours as needed for nausea or vomiting.   TIADYLT ER 240 MG 24 hr capsule TAKE 1 CAPSULE EVERY DAY   traMADol (ULTRAM) 50 MG tablet Take 1 tablet (50 mg total) by mouth every 6 (six) hours as needed for moderate pain.   XARELTO 20 MG TABS tablet TAKE 1 TABLET EVERY DAY WITH SUPPER   No current facility-administered medications for this visit. (Other)   REVIEW OF SYSTEMS: ROS   Positive for: Endocrine, Eyes Negative for: Constitutional, Gastrointestinal, Neurological, Skin, Genitourinary, Musculoskeletal, HENT, Cardiovascular, Respiratory, Psychiatric, Allergic/Imm, Heme/Lymph Last edited by Thompson Grayer, COT on 07/10/2023  1:03 PM.     ALLERGIES No Known Allergies  PAST MEDICAL HISTORY Past Medical  History:  Diagnosis Date   A-fib (HCC)    Arthritis    Atrial fibrillation (HCC)    Diabetes mellitus    Dysrhythmia    ED (erectile dysfunction)    Epistaxis    Hyperlipidemia    Hypertension    Past Surgical History:  Procedure Laterality Date   APPENDECTOMY     NASAL MASS EXCISION     NASAL SINUS SURGERY  12/16/2011   Procedure: ENDOSCOPIC SINUS SURGERY;  Surgeon: Drema Halon, MD;  Location: [facility] OR;  Service: ENT;  Laterality: N/A;  for control of epistaxis   TOTAL HIP ARTHROPLASTY Right 04/29/2021   Procedure: TOTAL HIP ARTHROPLASTY ANTERIOR  APPROACH;  Surgeon: Samson Frederic, MD;  Location: [facility] ORS;  Service: Orthopedics;  Laterality: Right;   FAMILY HISTORY History reviewed. No pertinent family history.  SOCIAL HISTORY Social History   Tobacco Use   Smoking status: Former    Current packs/day: 0.00    Types: Cigarettes    Quit date: 07/25/1990    Years since quitting: 32.9   Smokeless tobacco: Never  Vaping Use   Vaping status: Never Used  Substance Use Topics   Alcohol use: Not Currently   Drug use: No       OPHTHALMIC EXAM:  Base Eye Exam     Visual Acuity (Snellen - Linear)       Right Left   Dist [place] 20/20 -1 20/20         Tonometry (Tonopen, 1:09 PM)       Right Left   Pressure 16 14         Pupils       Pupils Dark Light Shape React APD   Right PERRL 3 2 Round Brisk None   Left PERRL 3 2 Round Brisk None         Visual Fields (Counting fingers)       Left Right    Full Full         Extraocular Movement       Right Left    Full, Ortho Full, Ortho         Neuro/Psych     Oriented x3: Yes   Mood/Affect: Normal         Dilation     Both eyes: 1.0% Mydriacyl, 2.5% Phenylephrine @ 1:10 PM           Slit Lamp and Fundus Exam     Slit Lamp Exam       Right Left   Lids/Lashes Dermatochalasis - upper lid Dermatochalasis - upper lid   Conjunctiva/Sclera Melanosis, nasal and temporal pinguecula Melanosis, nasal and temporal pinguecula   Cornea mild arcus, tear film debris, well healed cataract wound mild arcus, trace tear film debris, well healed cataract wound   Anterior Chamber deep and clear deep and clear   Iris Round and dilated, mild atrophy temporally, Transillumination defects Round and dilated   Lens 3 piece PC IOL in good position with open PC 3 piece PC IOL in good position with open PC   Anterior Vitreous Vitreous syneresis Vitreous syneresis         Fundus Exam       Right Left   Disc Pink and Sharp, mild hyperemia -- improved, +PPP mild Pallor,  Sharp rim, mild PPP   C/D Ratio 0.3 0.4   Macula Flat, Blunted foveal reflex, central cystic changes -- improved, focal IRH inferior to fovea, RPE mottling Flat, Blunted foveal reflex, RPE mottling and  clumping, No heme or edema   Vessels attenuated, Tortuous, copper wiring attenuated, mild tortuosity   Periphery Attached, 360 MA/DBH greatest temporally -- improved Attached, No heme           IMAGING AND PROCEDURES  Imaging and Procedures for 07/10/2023  OCT, Retina - OU - Both Eyes       Right Eye Quality was good. Central Foveal Thickness: 293. Progression has been stable. Findings include normal foveal contour, no IRF, no SRF (Stable resolution of cystic changes superior fovea).   Left Eye Quality was good. Central Foveal Thickness: 298. Progression has been stable. Findings include normal foveal contour, no IRF, no SRF.   Notes *Images captured and stored on drive  Diagnosis / Impression:  OD: stable resolution of cystic changes superior fovea -- h/o remote CRVO OS: NFP, no IRF/SRF  Clinical management:  See below  Abbreviations: NFP - Normal foveal profile. CME - cystoid macular edema. PED - pigment epithelial detachment. IRF - intraretinal fluid. SRF - subretinal fluid. EZ - ellipsoid zone. ERM - epiretinal membrane. ORA - outer retinal atrophy. ORT - outer retinal tubulation. SRHM - subretinal hyper-reflective material. IRHM - intraretinal hyper-reflective material            ASSESSMENT/PLAN:    ICD-10-CM   1. Central retinal vein occlusion with macular edema of right eye  H34.8110 OCT, Retina - OU - Both Eyes    2. Diabetes mellitus type 2 without retinopathy (HCC)  E11.9     3. Long term (current) use of oral hypoglycemic drugs  Z79.84     4. Essential hypertension  I10     5. Hypertensive retinopathy of both eyes  H35.033     6. Pseudophakia, both eyes  Z96.1      Remote CRVO w/ mild CME OD - BCVA OD 20/20 from 20/25 and pt with minimal symptoms or  visual complaints -- stable - exam shows interval improvement in 360 DBH - OCT shows OD: Central IRF/cystic changes superior fovea stably improved - FA (03.11.24) shows Mild perifoveal petaloid leakage, hyper fluorescence of the disc, mild MA with leakage peripherally -- ?remote RVO - daughter reports BP may have spiked in Feb, when pt's wife was diagnosed w/ gastric cancer - no treatment recommended at this time -- will monitor - F/U 6 mos, sooner prn -- DFE/OCT  2,3. Diabetes mellitus, type 2 without retinopathy - The incidence, risk factors for progression, natural history and treatment options for diabetic retinopathy  were discussed with patient.   - The need for close monitoring of blood glucose, blood pressure, and serum lipids, avoiding cigarette or any type of tobacco, and the need for long term follow up was also discussed with patient.  - FA 03.11.23 shows minimal MA OU - f/u in 1 year, sooner prn  4,5. Hypertensive retinopathy OU - discussed importance of tight BP control - monitor  6. Pseudophakia OU  - s/p CE/IOL  - IOL in good position, doing well  - monitor   Ophthalmic Meds Ordered this visit:  No orders of the defined types were placed in this encounter.    Return in about 1 year (around 07/09/2024) for f/u CRVO OS, DFE, OCT.  There are no Patient Instructions on file for this visit.  Explained the diagnoses, plan, and follow up with the patient and they expressed understanding.  Patient expressed understanding of the importance of proper follow up care.   This document serves as a record of services personally performed  by Karie Chimera, MD, PhD. It was created on their behalf by Glee Arvin. Manson Passey, OA an ophthalmic technician. The creation of this record is the provider's dictation and/or activities during the visit.    Electronically signed by: Glee Arvin. Manson Passey, OA 07/10/23 11:42 PM  This document serves as a record of services personally performed by Karie Chimera, MD, PhD. It was created on their behalf by Glee Arvin. Manson Passey, OA an ophthalmic technician. The creation of this record is the provider's dictation and/or activities during the visit.    Electronically signed by: Glee Arvin. Manson Passey, OA 07/10/23 11:42 PM  Karie Chimera, M.D., Ph.D. Diseases & Surgery of the Retina and Vitreous [facility]  I have reviewed the above documentation for accuracy and completeness, and I agree with the above. Karie Chimera, M.D., Ph.D. 07/10/23 11:44 PM  Abbreviations: M myopia (nearsighted); A astigmatism; H hyperopia (farsighted); P presbyopia; Mrx spectacle prescription;  CTL contact lenses; OD right eye; OS left eye; OU both eyes  XT exotropia; ET esotropia; PEK punctate epithelial keratitis; PEE punctate epithelial erosions; DES dry eye syndrome; MGD meibomian gland dysfunction; ATs artificial tears; PFAT's preservative free artificial tears; NSC nuclear sclerotic cataract; PSC posterior subcapsular cataract; ERM epi-retinal membrane; PVD posterior vitreous detachment; RD retinal detachment; DM diabetes mellitus; DR diabetic retinopathy; NPDR non-proliferative diabetic retinopathy; PDR proliferative diabetic retinopathy; CSME clinically significant macular edema; DME diabetic macular edema; dbh dot blot hemorrhages; CWS cotton wool spot; POAG primary open angle glaucoma; C/D cup-to-disc ratio; HVF humphrey visual field; GVF goldmann visual field; OCT optical coherence tomography; IOP intraocular pressure; BRVO Branch retinal vein occlusion; CRVO central retinal vein occlusion; CRAO central retinal artery occlusion; BRAO branch retinal artery occlusion; RT retinal tear; SB scleral buckle; PPV pars plana vitrectomy; VH Vitreous hemorrhage; PRP panretinal laser photocoagulation; IVK intravitreal kenalog; VMT vitreomacular traction; MH Macular hole;  NVD neovascularization of the disc; NVE neovascularization elsewhere; AREDS age related eye disease  study; ARMD age related macular degeneration; POAG primary open angle glaucoma; EBMD epithelial/anterior basement membrane dystrophy; ACIOL anterior chamber intraocular lens; IOL intraocular lens; PCIOL posterior chamber intraocular lens; Phaco/IOL phacoemulsification with intraocular lens placement; PRK photorefractive keratectomy; LASIK laser assisted in situ keratomileusis; HTN hypertension; DM diabetes mellitus; COPD chronic obstructive pulmonary disease

## 2023-07-10 ENCOUNTER — Encounter (INDEPENDENT_AMBULATORY_CARE_PROVIDER_SITE_OTHER): Payer: Self-pay | Admitting: Ophthalmology

## 2023-07-10 ENCOUNTER — Ambulatory Visit (INDEPENDENT_AMBULATORY_CARE_PROVIDER_SITE_OTHER): Payer: Medicare HMO | Admitting: Ophthalmology

## 2023-07-10 DIAGNOSIS — Z7984 Long term (current) use of oral hypoglycemic drugs: Secondary | ICD-10-CM | POA: Diagnosis not present

## 2023-07-10 DIAGNOSIS — I10 Essential (primary) hypertension: Secondary | ICD-10-CM

## 2023-07-10 DIAGNOSIS — Z961 Presence of intraocular lens: Secondary | ICD-10-CM

## 2023-07-10 DIAGNOSIS — H35033 Hypertensive retinopathy, bilateral: Secondary | ICD-10-CM

## 2023-07-10 DIAGNOSIS — H34811 Central retinal vein occlusion, right eye, with macular edema: Secondary | ICD-10-CM

## 2023-07-10 DIAGNOSIS — E119 Type 2 diabetes mellitus without complications: Secondary | ICD-10-CM | POA: Diagnosis not present

## 2023-07-24 DIAGNOSIS — E785 Hyperlipidemia, unspecified: Secondary | ICD-10-CM | POA: Diagnosis not present

## 2023-07-24 DIAGNOSIS — M25551 Pain in right hip: Secondary | ICD-10-CM | POA: Diagnosis not present

## 2023-07-24 DIAGNOSIS — M79604 Pain in right leg: Secondary | ICD-10-CM | POA: Diagnosis not present

## 2023-07-24 DIAGNOSIS — M4800 Spinal stenosis, site unspecified: Secondary | ICD-10-CM | POA: Diagnosis not present

## 2023-07-24 DIAGNOSIS — I700 Atherosclerosis of aorta: Secondary | ICD-10-CM | POA: Diagnosis not present

## 2023-07-24 DIAGNOSIS — M549 Dorsalgia, unspecified: Secondary | ICD-10-CM | POA: Diagnosis not present

## 2023-07-24 DIAGNOSIS — M4810 Ankylosing hyperostosis [Forestier], site unspecified: Secondary | ICD-10-CM | POA: Diagnosis not present

## 2023-07-24 DIAGNOSIS — M545 Low back pain, unspecified: Secondary | ICD-10-CM | POA: Diagnosis not present

## 2023-07-24 DIAGNOSIS — M51362 Other intervertebral disc degeneration, lumbar region with discogenic back pain and lower extremity pain: Secondary | ICD-10-CM | POA: Diagnosis not present

## 2023-08-09 DIAGNOSIS — M79604 Pain in right leg: Secondary | ICD-10-CM | POA: Diagnosis not present

## 2023-08-09 DIAGNOSIS — M545 Low back pain, unspecified: Secondary | ICD-10-CM | POA: Diagnosis not present

## 2023-08-16 DIAGNOSIS — M545 Low back pain, unspecified: Secondary | ICD-10-CM | POA: Diagnosis not present

## 2023-08-16 DIAGNOSIS — M79604 Pain in right leg: Secondary | ICD-10-CM | POA: Diagnosis not present

## 2023-08-23 DIAGNOSIS — M545 Low back pain, unspecified: Secondary | ICD-10-CM | POA: Diagnosis not present

## 2023-08-30 DIAGNOSIS — M545 Low back pain, unspecified: Secondary | ICD-10-CM | POA: Diagnosis not present

## 2023-08-30 DIAGNOSIS — M79604 Pain in right leg: Secondary | ICD-10-CM | POA: Diagnosis not present

## 2023-09-06 DIAGNOSIS — I4891 Unspecified atrial fibrillation: Secondary | ICD-10-CM | POA: Diagnosis not present

## 2023-09-06 DIAGNOSIS — D6869 Other thrombophilia: Secondary | ICD-10-CM | POA: Diagnosis not present

## 2023-09-06 DIAGNOSIS — M5459 Other low back pain: Secondary | ICD-10-CM | POA: Diagnosis not present

## 2023-09-06 DIAGNOSIS — M25511 Pain in right shoulder: Secondary | ICD-10-CM | POA: Diagnosis not present

## 2023-09-11 DIAGNOSIS — M5416 Radiculopathy, lumbar region: Secondary | ICD-10-CM | POA: Diagnosis not present

## 2023-09-18 DIAGNOSIS — M5416 Radiculopathy, lumbar region: Secondary | ICD-10-CM | POA: Diagnosis not present

## 2023-09-18 DIAGNOSIS — M539 Dorsopathy, unspecified: Secondary | ICD-10-CM | POA: Diagnosis not present

## 2023-09-26 DIAGNOSIS — M7541 Impingement syndrome of right shoulder: Secondary | ICD-10-CM | POA: Diagnosis not present

## 2023-10-05 DIAGNOSIS — M5416 Radiculopathy, lumbar region: Secondary | ICD-10-CM | POA: Diagnosis not present

## 2023-10-19 DIAGNOSIS — G4700 Insomnia, unspecified: Secondary | ICD-10-CM | POA: Diagnosis not present

## 2023-10-19 DIAGNOSIS — K625 Hemorrhage of anus and rectum: Secondary | ICD-10-CM | POA: Diagnosis not present

## 2023-10-26 DIAGNOSIS — M5416 Radiculopathy, lumbar region: Secondary | ICD-10-CM | POA: Diagnosis not present

## 2023-11-07 DIAGNOSIS — M7541 Impingement syndrome of right shoulder: Secondary | ICD-10-CM | POA: Diagnosis not present

## 2023-11-07 DIAGNOSIS — M25511 Pain in right shoulder: Secondary | ICD-10-CM | POA: Diagnosis not present

## 2023-11-07 DIAGNOSIS — M19011 Primary osteoarthritis, right shoulder: Secondary | ICD-10-CM | POA: Diagnosis not present

## 2023-11-09 DIAGNOSIS — I10 Essential (primary) hypertension: Secondary | ICD-10-CM | POA: Diagnosis not present

## 2023-11-09 DIAGNOSIS — Z1331 Encounter for screening for depression: Secondary | ICD-10-CM | POA: Diagnosis not present

## 2023-11-09 DIAGNOSIS — I4821 Permanent atrial fibrillation: Secondary | ICD-10-CM | POA: Diagnosis not present

## 2023-11-09 DIAGNOSIS — D6869 Other thrombophilia: Secondary | ICD-10-CM | POA: Diagnosis not present

## 2023-11-09 DIAGNOSIS — M109 Gout, unspecified: Secondary | ICD-10-CM | POA: Diagnosis not present

## 2023-11-09 DIAGNOSIS — J309 Allergic rhinitis, unspecified: Secondary | ICD-10-CM | POA: Diagnosis not present

## 2023-11-09 DIAGNOSIS — E782 Mixed hyperlipidemia: Secondary | ICD-10-CM | POA: Diagnosis not present

## 2023-11-09 DIAGNOSIS — N400 Enlarged prostate without lower urinary tract symptoms: Secondary | ICD-10-CM | POA: Diagnosis not present

## 2023-11-09 DIAGNOSIS — M519 Unspecified thoracic, thoracolumbar and lumbosacral intervertebral disc disorder: Secondary | ICD-10-CM | POA: Diagnosis not present

## 2023-11-09 DIAGNOSIS — E113311 Type 2 diabetes mellitus with moderate nonproliferative diabetic retinopathy with macular edema, right eye: Secondary | ICD-10-CM | POA: Diagnosis not present

## 2023-11-09 DIAGNOSIS — Z0000 Encounter for general adult medical examination without abnormal findings: Secondary | ICD-10-CM | POA: Diagnosis not present

## 2023-11-13 DIAGNOSIS — Z0000 Encounter for general adult medical examination without abnormal findings: Secondary | ICD-10-CM | POA: Diagnosis not present

## 2023-11-13 DIAGNOSIS — E113311 Type 2 diabetes mellitus with moderate nonproliferative diabetic retinopathy with macular edema, right eye: Secondary | ICD-10-CM | POA: Diagnosis not present

## 2023-11-28 ENCOUNTER — Emergency Department (HOSPITAL_BASED_OUTPATIENT_CLINIC_OR_DEPARTMENT_OTHER)
Admission: EM | Admit: 2023-11-28 | Discharge: 2023-11-28 | Disposition: A | Discharge status: Home / Self Care | Attending: Emergency Medicine | Admitting: Emergency Medicine

## 2023-11-28 ENCOUNTER — Emergency Department (HOSPITAL_BASED_OUTPATIENT_CLINIC_OR_DEPARTMENT_OTHER)

## 2023-11-28 ENCOUNTER — Other Ambulatory Visit: Payer: Self-pay

## 2023-11-28 ENCOUNTER — Encounter (HOSPITAL_BASED_OUTPATIENT_CLINIC_OR_DEPARTMENT_OTHER): Payer: Self-pay | Admitting: Emergency Medicine

## 2023-11-28 DIAGNOSIS — K529 Noninfective gastroenteritis and colitis, unspecified: Secondary | ICD-10-CM | POA: Insufficient documentation

## 2023-11-28 DIAGNOSIS — R935 Abnormal findings on diagnostic imaging of other abdominal regions, including retroperitoneum: Secondary | ICD-10-CM | POA: Diagnosis not present

## 2023-11-28 DIAGNOSIS — R109 Unspecified abdominal pain: Secondary | ICD-10-CM | POA: Diagnosis present

## 2023-11-28 DIAGNOSIS — K4020 Bilateral inguinal hernia, without obstruction or gangrene, not specified as recurrent: Secondary | ICD-10-CM | POA: Diagnosis not present

## 2023-11-28 DIAGNOSIS — Z7901 Long term (current) use of anticoagulants: Secondary | ICD-10-CM | POA: Insufficient documentation

## 2023-11-28 DIAGNOSIS — K8020 Calculus of gallbladder without cholecystitis without obstruction: Secondary | ICD-10-CM | POA: Diagnosis not present

## 2023-11-28 LAB — HEMOGLOBIN AND HEMATOCRIT, BLOOD
HCT: 40.9 % (ref 39.0–52.0)
Hemoglobin: 13.8 g/dL (ref 13.0–17.0)

## 2023-11-28 LAB — CBC WITH DIFFERENTIAL/PLATELET
Abs Immature Granulocytes: 0.05 10*3/uL (ref 0.00–0.07)
Basophils Absolute: 0 10*3/uL (ref 0.0–0.1)
Basophils Relative: 0 %
Eosinophils Absolute: 0.1 10*3/uL (ref 0.0–0.5)
Eosinophils Relative: 1 %
HCT: 43.7 % (ref 39.0–52.0)
Hemoglobin: 14.8 g/dL (ref 13.0–17.0)
Immature Granulocytes: 1 %
Lymphocytes Relative: 8 %
Lymphs Abs: 0.8 10*3/uL (ref 0.7–4.0)
MCH: 32.6 pg (ref 26.0–34.0)
MCHC: 33.9 g/dL (ref 30.0–36.0)
MCV: 96.3 fL (ref 80.0–100.0)
Monocytes Absolute: 0.5 10*3/uL (ref 0.1–1.0)
Monocytes Relative: 5 %
Neutro Abs: 8.9 10*3/uL — ABNORMAL HIGH (ref 1.7–7.7)
Neutrophils Relative %: 85 %
Platelets: 167 10*3/uL (ref 150–400)
RBC: 4.54 MIL/uL (ref 4.22–5.81)
RDW: 13.9 % (ref 11.5–15.5)
WBC: 10.4 10*3/uL (ref 4.0–10.5)
nRBC: 0 % (ref 0.0–0.2)

## 2023-11-28 LAB — BASIC METABOLIC PANEL WITH GFR
Anion gap: 12 (ref 5–15)
BUN: 18 mg/dL (ref 8–23)
CO2: 27 mmol/L (ref 22–32)
Calcium: 9.5 mg/dL (ref 8.9–10.3)
Chloride: 97 mmol/L — ABNORMAL LOW (ref 98–111)
Creatinine, Ser: 1.02 mg/dL (ref 0.61–1.24)
GFR, Estimated: >60 mL/min (ref >60)
Glucose, Bld: 205 mg/dL — ABNORMAL HIGH (ref 70–99)
Potassium: 4.1 mmol/L (ref 3.5–5.1)
Sodium: 136 mmol/L (ref 135–145)

## 2023-11-28 LAB — LIPASE, BLOOD: Lipase: 57 U/L — ABNORMAL HIGH (ref 11–51)

## 2023-11-28 MED ORDER — IOHEXOL 300 MG/ML  SOLN
100.0000 mL | Freq: Once | INTRAMUSCULAR | Status: AC | PRN
  Administered 2023-11-28: 80 mL via INTRAVENOUS

## 2023-11-28 MED ORDER — DICYCLOMINE HCL 10 MG PO CAPS: 10.0000 mg | ORAL_CAPSULE | Freq: Three times a day (TID) | ORAL | 0 refills | Status: AC | PRN

## 2023-11-28 NOTE — ED Provider Notes (Signed)
 Robert Irwin EMERGENCY DEPARTMENT AT [facility] Provider Note   CSN: [id number] Arrival date & time: 11/28/23  5284     History  Chief Complaint  Patient presents with   Dizziness   Abdominal Pain    Robert Irwin is a 83 y.o. male.  Robert Irwin is 83 y.o. male presenting for abdominal pain.  Patient presents today for slowly worsening abdominal pain since Sunday evening.  Reports that he ate crab legs and some dairy products at that time.  Did not notice any symptoms at that time.  Pain is located in the left and right lower quadrants, described as crampy.  He denies any nausea or vomiting.  He has had multiple bowel movements this a.m. x 3 without diarrhea however he does state they are small.  His last regular bowel movement was Sunday morning he states that he had 1 that was black and tarry.  However, he does admit to taking Pepto-Bismol multiple times yesterday.  His most recent bowel movement he did notice a little bit of blood in his stool which is why he came to the ED.  He denies any cough, fever, runny nose, no difficulty breathing.  The history is provided by the patient.       Home Medications Prior to Admission medications   Medication Sig Start Date End Date Taking? Authorizing Provider  dicyclomine  (BENTYL ) 10 MG capsule Take 1 capsule (10 mg total) by mouth 3 (three) times daily as needed for spasms. 11/28/23  Yes Ivin Marrow, MD  acetaminophen  (TYLENOL ) 325 MG tablet Take 2 tablets (650 mg total) by mouth every 6 (six) hours as needed for mild pain (or Fever >/= 101). 05/17/21   Armenta Landau, MD  amoxicillin -clavulanate (AUGMENTIN ) 875-125 MG tablet Take 1 tablet by mouth every 12 (twelve) hours. 04/15/23   Curatolo, Adam, DO  atorvastatin  (LIPITOR) 20 MG tablet TAKE 1 TABLET EVERY DAY (NEED MD APPOINTMENT) 06/27/23   Hugh Madura, MD  calcium  citrate-vitamin D  200-200 MG-UNIT TABS Take 1 tablet by mouth daily.    [provider]  glimepiride   (AMARYL ) 2 MG tablet Take 2 mg by mouth daily before breakfast.    [provider]  Glucosamine-Chondroitin (OSTEO BI-FLEX REGULAR STRENGTH PO) Take 1 tablet by mouth 2 (two) times daily.    [provider]  methocarbamol  (ROBAXIN ) 500 MG tablet Take 1 tablet (500 mg total) by mouth 3 (three) times daily as needed for muscle spasms. 01/28/23   Harris, Abigail, PA-C  metoCLOPramide  (REGLAN ) 10 MG tablet Take 0.5 tablets (5 mg total) by mouth every 6 (six) hours as needed for nausea (nausea/headache). 01/28/23   Harris, Abigail, PA-C  Multiple Vitamin (MULITIVITAMIN WITH MINERALS) TABS Take 1 tablet by mouth daily.    [provider]  ondansetron  (ZOFRAN -ODT) 4 MG disintegrating tablet Take 1 tablet (4 mg total) by mouth every 8 (eight) hours as needed. 04/15/23   Curatolo, Adam, DO  prochlorperazine  (COMPAZINE ) 10 MG tablet Take 1 tablet (10 mg total) by mouth every 6 (six) hours as needed for nausea or vomiting. 05/09/21   Armenta Landau, MD  TIADYLT  ER 240 MG 24 hr capsule TAKE 1 CAPSULE EVERY DAY 04/14/23   Hugh Madura, MD  traMADol  (ULTRAM ) 50 MG tablet Take 1 tablet (50 mg total) by mouth every 6 (six) hours as needed for moderate pain. 05/09/21   Armenta Landau, MD  XARELTO  20 MG TABS tablet TAKE 1 TABLET EVERY DAY WITH SUPPER  04/03/23   Hugh Madura, MD      Allergies    Patient has no known allergies.    Review of Systems   Review of Systems  Physical Exam Updated Vital Signs BP 128/62 (BP Location: Right Arm)   Pulse 96   Temp 98.4 F (36.9 C) (Oral)   Resp 14   SpO2 99%  Physical Exam Vitals and nursing note reviewed.  Constitutional:      General: He is not in acute distress.    Appearance: He is well-developed.  HENT:     Head: Normocephalic and atraumatic.  Eyes:     Conjunctiva/sclera: Conjunctivae normal.  Cardiovascular:     Rate and Rhythm: Normal rate and regular rhythm.     Heart sounds: No murmur heard. Pulmonary:     Effort:  Pulmonary effort is normal. No respiratory distress.     Breath sounds: Normal breath sounds.  Abdominal:     Palpations: Abdomen is soft.     Tenderness: There is abdominal tenderness in the right lower quadrant. There is no guarding or rebound.  Genitourinary:    Rectum: Normal.     Comments: Mild external hemorrhoid, no obvious rectal bleeding Musculoskeletal:        General: No swelling.     Cervical back: Neck supple.  Skin:    General: Skin is warm and dry.     Capillary Refill: Capillary refill takes less than 2 seconds.  Neurological:     General: No focal deficit present.     Mental Status: He is alert.  Psychiatric:        Mood and Affect: Mood normal.   ED Results / Procedures / Treatments   Labs (all labs ordered are listed, but only abnormal results are displayed) Labs Reviewed  BASIC METABOLIC PANEL WITH GFR - Abnormal; Notable for the following components:      Result Value   Chloride 97 (*)    Glucose, Bld 205 (*)    All other components within normal limits  CBC WITH DIFFERENTIAL/PLATELET - Abnormal; Notable for the following components:   Neutro Abs 8.9 (*)    All other components within normal limits  LIPASE, BLOOD - Abnormal; Notable for the following components:   Lipase 57 (*)    All other components within normal limits  HEMOGLOBIN AND HEMATOCRIT, BLOOD    EKG None  Radiology CT ABDOMEN PELVIS W CONTRAST Result Date: 11/28/2023 CLINICAL DATA:  Lower GI bleed.  Lower abdominal pain. EXAM: CT ABDOMEN AND PELVIS WITH CONTRAST TECHNIQUE: Multidetector CT imaging of the abdomen and pelvis was performed using the standard protocol following bolus administration of intravenous contrast. RADIATION DOSE REDUCTION: This exam was performed according to the departmental dose-optimization program which includes automated exposure control, adjustment of the mA and/or kV according to patient size and/or use of iterative reconstruction technique. CONTRAST:  80mL  OMNIPAQUE  IOHEXOL  300 MG/ML  SOLN COMPARISON:  CT scan abdomen and pelvis from 04/15/2023. FINDINGS: Lower chest: The lung bases are clear. No pleural effusion. The heart is normal in size. No pericardial effusion. Hepatobiliary: The liver is normal in size. Non-cirrhotic configuration. These is diffuse hepatic steatosis. No suspicious mass. Note is made of a subcentimeter hypoattenuating focus in the left hepatic lobe, segment 3, which is too small to adequately characterize. No intrahepatic or extrahepatic bile duct dilation. Small volume dependent calcified gallstones noted without imaging signs of acute cholecystitis. Normal gallbladder wall thickness. No pericholecystic inflammatory changes. Pancreas: Unremarkable. No pancreatic  ductal dilatation or surrounding inflammatory changes. Spleen: Within normal limits. No focal lesion. Adrenals/Urinary Tract: Adrenal glands are unremarkable. No suspicious renal mass. There are several subcentimeter hypoattenuating foci 4 bilateral kidneys, which are too small to adequately characterize. No hydronephrosis. No renal or ureteric calculi. Unremarkable urinary bladder. Stomach/Bowel: No disproportionate dilation of the small or large bowel loops. There are at least 3 short (length between 3-5 cm) segments of distal ileum (marked with electronic arrow sign on series 4) which exhibit mild-to-moderate circumferential wall thickening and mucosal hyperattenuation with associated perienteric fat stranding and prominence of Vasa recta. There are intervening small relatively unaffected areas. No associated abscess or loculated collection. No pneumatosis, pneumoperitoneum or portal venous gas. Findings favor multifocal enteritis most likely infective or inflammatory etiology. Correlate clinically. Please note terminal ileum appears unremarkable. No active extravasation of intravenous contrast noted to suggest active GI bleeding. The appendix was not visualized; however there is no  acute inflammatory process in the right lower quadrant. Vascular/Lymphatic: No ascites or pneumoperitoneum. No abdominal or pelvic lymphadenopathy, by size criteria. No aneurysmal dilation of the major abdominal arteries. There are moderate peripheral atherosclerotic vascular calcifications of the aorta and its major branches. Reproductive: Normal size prostate. Symmetric seminal vesicles. Other: There are fat containing umbilical and bilateral inguinal hernias. The soft tissues and abdominal wall are otherwise unremarkable. Musculoskeletal: No suspicious osseous lesions. There are moderate multilevel degenerative changes in the visualized spine. Note is made of right hip arthroplasty. IMPRESSION: 1. There are at least 3 short segments of distal ileum exhibiting mild-to-moderate circumferential wall thickening and mucosal hyperattenuation with associated perienteric fat stranding and prominence of Vasa recta. Findings favor multifocal enteritis of likely infective or inflammatory (such as Crohn's disease) etiology. No associated abscess or loculated collection. No active extravasation of contrast noted to suggest active GI bleeding. 2. Multiple other nonacute observations, as described above. Aortic Atherosclerosis (ICD10-I70.0). Electronically Signed   By: Beula Brunswick M.D.   On: 11/28/2023 12:10    Procedures Procedures    Medications Ordered in ED Medications  iohexol  (OMNIPAQUE ) 300 MG/ML solution 100 mL (80 mLs Intravenous Contrast Given 11/28/23 1134)    ED Course/ Medical Decision Making/ A&P                                 Medical Decision Making Differential for abdominal pain is includes but is not limited to diverticulitis, constipation, colon cancer, hemorrhoids, appendicitis, pancreatitis, cholecystis, gi bleed.  Given patient's reported mild hematochezia and current anticoagulation with Xarelto  will workup for possible GI bleed, lower suspicion for infectious etiology at this time  without systemic symptoms.  Patient's last colonoscopy appears to been in 2015 with minor polyps.  Will get BMP, CBC, lipase and CT abdomen pelvis with contrast to further evaluate.  Reassuringly patient's abdominal exam mild tender is nonacute and he is overall well-appearing with stable vitals.  CT abdomen and pelvis with contrast significant for enteritis consistent with inflammatory or infectious etiology.  No signs of acute bleeding.  Lipase mildly elevated but not x3 above normal. This is likely infectious enteritis given clinical context of abnormal diet. Expect improvement over next several days. Will send bentyl  to patient's pharmacy to help with pain.   Pending recheck H&H to trend Hgb. If not acute dropping patient stable for discharge with PCP follow-up. Discussed with patient who is agreeable to plan.    H&H stable, no signs of bleeding.  At  this time there does not appear to be any evidence of an acute emergency medical condition and the patient appears stable for discharge with appropriate outpatient follow up. Diagnosis was discussed with patient who verbalizes understanding of care plan and is agreeable to discharge. I have discussed return precautions with patient and father who verbalizes understanding. Patient encouraged to follow-up with their PCP as soon as possible. All questions answered.    Amount and/or Complexity of Data Reviewed Labs: ordered. Radiology: ordered.  Risk Prescription drug management.          Final Clinical Impression(s) / ED Diagnoses Final diagnoses:  Enteritis    Rx / DC Orders ED Discharge Orders          Ordered    dicyclomine  (BENTYL ) 10 MG capsule  3 times daily PRN        11/28/23 1349              Ivin Marrow, MD 11/28/23 1508    Afton Horse T, DO 12/07/23 0730

## 2023-11-28 NOTE — Discharge Instructions (Addendum)
 You came to the ED with abdominal pain and blood in your stool. You got a CT scan that showed inflammation of your small bowels, there is no evidence of bleeding. I have sent Bentyl to your pharmacy this can help with your pain.  You may take this up to 3 times a day as needed. I recommend you follow-up with the stomach doctor listed below. You can call to schedule an appointment and also you should follow-up with your primary care provider. If you have any further bleeding, new fevers or worsening abdominal pain please return to be seen.

## 2023-11-28 NOTE — ED Notes (Signed)
 Reviewed AVS/discharge instruction with patient. Time allotted for and all questions answered. Patient is agreeable for d/c and escorted to ed exit by staff.

## 2023-11-28 NOTE — ED Triage Notes (Signed)
 C/o lower abd pain since yesterday. Denies n/v/d. States pain started after eating crab legs. Reports one black stools.

## 2023-12-21 ENCOUNTER — Emergency Department (HOSPITAL_BASED_OUTPATIENT_CLINIC_OR_DEPARTMENT_OTHER)
Admission: EM | Admit: 2023-12-21 | Discharge: 2023-12-21 | Disposition: A | Discharge status: Home / Self Care | Attending: Emergency Medicine | Admitting: Emergency Medicine

## 2023-12-21 ENCOUNTER — Encounter (HOSPITAL_BASED_OUTPATIENT_CLINIC_OR_DEPARTMENT_OTHER): Payer: Self-pay | Admitting: Emergency Medicine

## 2023-12-21 ENCOUNTER — Other Ambulatory Visit: Payer: Self-pay

## 2023-12-21 ENCOUNTER — Emergency Department (HOSPITAL_BASED_OUTPATIENT_CLINIC_OR_DEPARTMENT_OTHER)

## 2023-12-21 DIAGNOSIS — U071 COVID-19: Secondary | ICD-10-CM | POA: Insufficient documentation

## 2023-12-21 DIAGNOSIS — R0602 Shortness of breath: Secondary | ICD-10-CM | POA: Diagnosis present

## 2023-12-21 DIAGNOSIS — E119 Type 2 diabetes mellitus without complications: Secondary | ICD-10-CM | POA: Diagnosis not present

## 2023-12-21 DIAGNOSIS — I4891 Unspecified atrial fibrillation: Secondary | ICD-10-CM | POA: Diagnosis not present

## 2023-12-21 DIAGNOSIS — I10 Essential (primary) hypertension: Secondary | ICD-10-CM | POA: Diagnosis not present

## 2023-12-21 DIAGNOSIS — R6883 Chills (without fever): Secondary | ICD-10-CM | POA: Diagnosis not present

## 2023-12-21 DIAGNOSIS — Z96641 Presence of right artificial hip joint: Secondary | ICD-10-CM | POA: Insufficient documentation

## 2023-12-21 DIAGNOSIS — Z79899 Other long term (current) drug therapy: Secondary | ICD-10-CM | POA: Insufficient documentation

## 2023-12-21 DIAGNOSIS — Z7984 Long term (current) use of oral hypoglycemic drugs: Secondary | ICD-10-CM | POA: Insufficient documentation

## 2023-12-21 DIAGNOSIS — Z7901 Long term (current) use of anticoagulants: Secondary | ICD-10-CM | POA: Diagnosis not present

## 2023-12-21 DIAGNOSIS — R059 Cough, unspecified: Secondary | ICD-10-CM | POA: Diagnosis not present

## 2023-12-21 LAB — URINALYSIS, ROUTINE W REFLEX MICROSCOPIC
Bilirubin Urine: NEGATIVE
Glucose, UA: NEGATIVE mg/dL
Hgb urine dipstick: NEGATIVE
Ketones, ur: NEGATIVE mg/dL
Leukocytes,Ua: NEGATIVE
Nitrite: NEGATIVE
Protein, ur: NEGATIVE mg/dL
Specific Gravity, Urine: 1.006 (ref 1.005–1.030)
pH: 7.5 (ref 5.0–8.0)

## 2023-12-21 LAB — CBC WITH DIFFERENTIAL/PLATELET
Abs Immature Granulocytes: 0.04 10*3/uL (ref 0.00–0.07)
Basophils Absolute: 0 10*3/uL (ref 0.0–0.1)
Basophils Relative: 0 %
Eosinophils Absolute: 0 10*3/uL (ref 0.0–0.5)
Eosinophils Relative: 0 %
HCT: 39.8 % (ref 39.0–52.0)
Hemoglobin: 13.5 g/dL (ref 13.0–17.0)
Immature Granulocytes: 1 %
Lymphocytes Relative: 10 %
Lymphs Abs: 0.9 10*3/uL (ref 0.7–4.0)
MCH: 32.7 pg (ref 26.0–34.0)
MCHC: 33.9 g/dL (ref 30.0–36.0)
MCV: 96.4 fL (ref 80.0–100.0)
Monocytes Absolute: 0.6 10*3/uL (ref 0.1–1.0)
Monocytes Relative: 7 %
Neutro Abs: 7.1 10*3/uL (ref 1.7–7.7)
Neutrophils Relative %: 82 %
Platelets: 213 10*3/uL (ref 150–400)
RBC: 4.13 MIL/uL — ABNORMAL LOW (ref 4.22–5.81)
RDW: 13.4 % (ref 11.5–15.5)
WBC: 8.7 10*3/uL (ref 4.0–10.5)
nRBC: 0 % (ref 0.0–0.2)

## 2023-12-21 LAB — RESP PANEL BY RT-PCR (RSV, FLU A&B, COVID)  RVPGX2
Influenza A by PCR: NEGATIVE
Influenza B by PCR: NEGATIVE
Resp Syncytial Virus by PCR: NEGATIVE
SARS Coronavirus 2 by RT PCR: POSITIVE — AB

## 2023-12-21 LAB — GROUP A STREP BY PCR: Group A Strep by PCR: NOT DETECTED

## 2023-12-21 LAB — COMPREHENSIVE METABOLIC PANEL WITH GFR
ALT: 23 U/L (ref 0–44)
AST: 22 U/L (ref 15–41)
Albumin: 4.2 g/dL (ref 3.5–5.0)
Alkaline Phosphatase: 78 U/L (ref 38–126)
Anion gap: 14 (ref 5–15)
BUN: <5 mg/dL — ABNORMAL LOW (ref 8–23)
CO2: 22 mmol/L (ref 22–32)
Calcium: 9.4 mg/dL (ref 8.9–10.3)
Chloride: 101 mmol/L (ref 98–111)
Creatinine, Ser: 0.75 mg/dL (ref 0.61–1.24)
GFR, Estimated: >60 mL/min (ref >60)
Glucose, Bld: 90 mg/dL (ref 70–99)
Potassium: 3.8 mmol/L (ref 3.5–5.1)
Sodium: 136 mmol/L (ref 135–145)
Total Bilirubin: 0.6 mg/dL (ref 0.0–1.2)
Total Protein: 6.8 g/dL (ref 6.5–8.1)

## 2023-12-21 LAB — PRO BRAIN NATRIURETIC PEPTIDE: Pro Brain Natriuretic Peptide: 442 pg/mL — ABNORMAL HIGH (ref <300.0)

## 2023-12-21 LAB — LACTIC ACID, PLASMA: Lactic Acid, Venous: 1.2 mmol/L (ref 0.5–1.9)

## 2023-12-21 LAB — MAGNESIUM: Magnesium: 2.1 mg/dL (ref 1.7–2.4)

## 2023-12-21 MED ORDER — BENZONATATE 100 MG PO CAPS
100.0000 mg | ORAL_CAPSULE | Freq: Three times a day (TID) | ORAL | 0 refills | Status: AC
Start: 2023-12-21 — End: ?

## 2023-12-21 MED ORDER — ACETAMINOPHEN 500 MG PO TABS
1000.0000 mg | ORAL_TABLET | Freq: Once | ORAL | Status: AC
  Administered 2023-12-21: 1000 mg via ORAL
  Filled 2023-12-21: qty 2

## 2023-12-21 MED ORDER — DILTIAZEM HCL 25 MG/5ML IV SOLN
15.0000 mg | Freq: Once | INTRAVENOUS | Status: AC
  Administered 2023-12-21: 15 mg via INTRAVENOUS
  Filled 2023-12-21: qty 5

## 2023-12-21 NOTE — ED Provider Notes (Signed)
 [facility] EMERGENCY DEPARTMENT AT [facility] Provider Note   CSN: [id number] Arrival date & time: 12/21/23  0830     History  Chief Complaint  Patient presents with   Generalized Body Aches    Robert Irwin is a 83 y.o. male.  HPI      83yo male with history of atrial fibrillation on xarelto , DM, hypertension, hyperlipidemia, lumbar radiculopathy, who presents with concern for cough, body aches and congestion and was found to be in atrial fibrillation with RVR.   Reports he recently flew back from [place].  2 days ago started to notice some cough, congestion and last night worsened with more severe cough> Up all night coughing. Nonproductive cough. Throat is very sore. Has nasal congestion. No known fever but has had chills, body aches all over. No chest pain or dyspnea. No n/v/d.  Has not had BM today.  Took his heart medicine today, xarelto  last night    Past Medical History:  Diagnosis Date   A-fib (HCC)    Arthritis    Atrial fibrillation (HCC)    Diabetes mellitus    Dysrhythmia    ED (erectile dysfunction)    Epistaxis    Hyperlipidemia    Hypertension     Past Surgical History:  Procedure Laterality Date   APPENDECTOMY     NASAL MASS EXCISION     NASAL SINUS SURGERY  12/16/2011   Procedure: ENDOSCOPIC SINUS SURGERY;  Surgeon: Prescott Brodie, MD;  Location: [facility] OR;  Service: ENT;  Laterality: N/A;  for control of epistaxis   TOTAL HIP ARTHROPLASTY Right 04/29/2021   Procedure: TOTAL HIP ARTHROPLASTY ANTERIOR APPROACH;  Surgeon: Adonica Hoose, MD;  Location: [facility] ORS;  Service: Orthopedics;  Laterality: Right;    Home Medications Prior to Admission medications   Medication Sig Start Date End Date Taking? Authorizing Provider  benzonatate (TESSALON) 100 MG capsule Take 1 capsule (100 mg total) by mouth every 8 (eight) hours. 12/21/23  Yes Scarlette Currier, MD  Eszopiclone 3 MG TABS Take 3 mg by mouth at bedtime as needed. 12/20/23  Yes [provider]  acetaminophen  (TYLENOL ) 325 MG tablet Take 2 tablets (650 mg total) by mouth every 6 (six) hours as needed for mild pain (or Fever >/= 101). 05/17/21   Armenta Landau, MD  amoxicillin -clavulanate (AUGMENTIN ) 875-125 MG tablet Take 1 tablet by mouth every 12 (twelve) hours. 04/15/23   Curatolo, Adam, DO  atorvastatin  (LIPITOR) 20 MG tablet TAKE 1 TABLET EVERY DAY (NEED MD APPOINTMENT) 06/27/23   Hugh Madura, MD  calcium  citrate-vitamin D  200-200 MG-UNIT TABS Take 1 tablet by mouth daily.    [provider]  dicyclomine  (BENTYL ) 10 MG capsule Take 1 capsule (10 mg total) by mouth 3 (three) times daily as needed for spasms. 11/28/23   Ivin Marrow, MD  glimepiride  (AMARYL ) 2 MG tablet Take 2 mg by mouth daily before breakfast.    [provider]  Glucosamine-Chondroitin (OSTEO BI-FLEX REGULAR STRENGTH PO) Take 1 tablet by mouth 2 (two) times daily.    [provider]  methocarbamol  (ROBAXIN ) 500 MG tablet Take 1 tablet (500 mg total) by mouth 3 (three) times daily as needed for muscle spasms. 01/28/23   Harris, Abigail, PA-C  metoCLOPramide  (REGLAN ) 10 MG tablet Take 0.5 tablets (5 mg total) by mouth every 6 (six) hours as needed for nausea (nausea/headache). 01/28/23   Harris, Abigail, PA-C  Multiple Vitamin (MULITIVITAMIN WITH MINERALS) TABS Take 1 tablet by mouth daily.  [provider]  ondansetron  (ZOFRAN -ODT) 4 MG disintegrating tablet Take 1 tablet (4 mg total) by mouth every 8 (eight) hours as needed. 04/15/23   Curatolo, Adam, DO  prochlorperazine  (COMPAZINE ) 10 MG tablet Take 1 tablet (10 mg total) by mouth every 6 (six) hours as needed for nausea or vomiting. 05/09/21   Armenta Landau, MD  TIADYLT  ER 240 MG 24 hr capsule TAKE 1 CAPSULE EVERY DAY 04/14/23   Hugh Madura, MD  traMADol  (ULTRAM ) 50 MG tablet Take 1 tablet (50 mg total) by mouth every 6 (six) hours as needed for moderate pain. 05/09/21   Armenta Landau, MD  XARELTO  20  MG TABS tablet TAKE 1 TABLET EVERY DAY WITH SUPPER 04/03/23   Hugh Madura, MD      Allergies    Patient has no known allergies.    Review of Systems   Review of Systems  Physical Exam Updated Vital Signs BP 124/70   Pulse (!) 109   Temp 99.7 F (37.6 C) (Oral)   Resp (!) 25   SpO2 95%  Physical Exam Vitals and nursing note reviewed.  Constitutional:      General: He is not in acute distress.    Appearance: He is well-developed. He is not diaphoretic.  HENT:     Head: Normocephalic and atraumatic.     Mouth/Throat:     Pharynx: Posterior oropharyngeal erythema (mild) present.  Eyes:     Conjunctiva/sclera: Conjunctivae normal.  Cardiovascular:     Rate and Rhythm: Tachycardia present. Rhythm irregular.     Heart sounds: Normal heart sounds. No murmur heard.    No friction rub. No gallop.  Pulmonary:     Effort: Pulmonary effort is normal. No respiratory distress.     Breath sounds: Normal breath sounds. No wheezing or rales.  Abdominal:     General: There is no distension.     Palpations: Abdomen is soft.     Tenderness: There is no abdominal tenderness. There is no guarding.  Musculoskeletal:     Cervical back: Normal range of motion.  Skin:    General: Skin is warm and dry.  Neurological:     Mental Status: He is alert and oriented to person, place, and time.     ED Results / Procedures / Treatments   Labs (all labs ordered are listed, but only abnormal results are displayed) Labs Reviewed  RESP PANEL BY RT-PCR (RSV, FLU A&B, COVID)  RVPGX2 - Abnormal; Notable for the following components:      Result Value   SARS Coronavirus 2 by RT PCR POSITIVE (*)    All other components within normal limits  CBC WITH DIFFERENTIAL/PLATELET - Abnormal; Notable for the following components:   RBC 4.13 (*)    All other components within normal limits  COMPREHENSIVE METABOLIC PANEL WITH GFR - Abnormal; Notable for the following components:   BUN <5 (*)    All other  components within normal limits  PRO BRAIN NATRIURETIC PEPTIDE - Abnormal; Notable for the following components:   Pro Brain Natriuretic Peptide 442.0 (*)    All other components within normal limits  GROUP A STREP BY PCR  CULTURE, BLOOD (ROUTINE X 2)  CULTURE, BLOOD (ROUTINE X 2)  MAGNESIUM   LACTIC ACID, PLASMA  URINALYSIS, ROUTINE W REFLEX MICROSCOPIC    EKG EKG Interpretation Date/Time:  Thursday Dec 21 2023 08:42:00 EDT Ventricular Rate:  139 PR Interval:    QRS Duration:  78 QT Interval:  288 QTC Calculation: 429 R Axis:   -26  Text Interpretation: Atrial fibrillation Ventricular premature complex Borderline left axis deviation Anterior infarct, old Minimal ST depression, inferior leads Baseline wander in lead(s) V3 Since prior ECG, rate has increased Confirmed by Scarlette Currier ([id number]) on 12/21/2023 8:51:27 AM  Radiology DG Chest Portable 1 View Result Date: 12/21/2023 CLINICAL DATA:  Recent return from long trip to chorea with cough and chills, initial encounter EXAM: PORTABLE CHEST 1 VIEW COMPARISON:  None Available. FINDINGS: The heart size and mediastinal contours are within normal limits. Both lungs are clear. The visualized skeletal structures are unremarkable. IMPRESSION: No active disease. Electronically Signed   By: Violeta Grey M.D.   On: 12/21/2023 10:22    Procedures Procedures    Medications Ordered in ED Medications  diltiazem  (CARDIZEM ) injection 15 mg (15 mg Intravenous Given 12/21/23 0907)  acetaminophen  (TYLENOL ) tablet 1,000 mg (1,000 mg Oral Given 12/21/23 0906)    ED Course/ Medical Decision Making/ A&P                                  83yo male with history of atrial fibrillation on xarelto , DM, hypertension, hyperlipidemia, lumbar radiculopathy, who presents with concern for cough, body aches and congestion and was found to be in atrial fibrillation with RVR.   DDx includes viral syndrome including COVID/influenza, pneumonia, other infection  such as UTI, consider cough related to CHF.  No sign of PTA/RPA/epiglottitis.   Suspect by history most likely infectious etiology of symptoms causing atrial fibrillation with RVR.   EKG completed and personally evaluated interpreted by me shows atrial fibrillation with.  Labs completed and personally evaluated interpreted by me shows no anemia, no leukocytosis, no clinically significant electrolyte abnormality, normal magnesium , normal lactic acid.  proBNP is 442 which is not significantly elevated for age.  UA is not consistent with infection.  Chest x-ray completed and personally evaluated interpreted by me shows no sign of pneumonia or edema.  Was given diltiazem  and acetaminophen  with HR decreased to the 100s.  His COVID-19 testing is positive.  Flu, strep, RSV are negative.  Suspect COVID-19 triggering A-fib with RVR.  Given his improvement with rate controlled medication, asymptomatic from the atrial fibrillation with no chest pain, shortness of breath, nor lightheadedness, feel he is stable for discharge with follow-up in the A-fib clinic.  Recommend continued supportive care for his COVID-19, including acetaminophen , Tessalon Perles for cough.  Given he is on xarelto , feel risks of paxlovid outweigh benefits.  Discussed strict return precautions. Patient discharged in stable condition with understanding of reasons to return.           Final Clinical Impression(s) / ED Diagnoses Final diagnoses:  Atrial fibrillation, unspecified type (HCC)  COVID-19    Rx / DC Orders ED Discharge Orders          Ordered    benzonatate (TESSALON) 100 MG capsule  Every 8 hours        12/21/23 1041              Scarlette Currier, MD 12/21/23 1042

## 2023-12-21 NOTE — ED Triage Notes (Signed)
 Returned from [place] on 5/26 Now body aches, chills, sore throat cough

## 2023-12-21 NOTE — ED Notes (Signed)
 DC paperwork given and verbally understood.

## 2023-12-21 NOTE — ED Notes (Signed)
 Provider cleared to hold 2nd culture until needed.Aaron AasAaron Aas

## 2023-12-26 LAB — CULTURE, BLOOD (ROUTINE X 2)
Culture: NO GROWTH
Special Requests: ADEQUATE

## 2023-12-27 DIAGNOSIS — U071 COVID-19: Secondary | ICD-10-CM | POA: Diagnosis not present

## 2023-12-27 DIAGNOSIS — Z1211 Encounter for screening for malignant neoplasm of colon: Secondary | ICD-10-CM | POA: Diagnosis not present

## 2023-12-27 DIAGNOSIS — R059 Cough, unspecified: Secondary | ICD-10-CM | POA: Diagnosis not present

## 2024-01-15 DIAGNOSIS — E113311 Type 2 diabetes mellitus with moderate nonproliferative diabetic retinopathy with macular edema, right eye: Secondary | ICD-10-CM | POA: Diagnosis not present

## 2024-01-15 DIAGNOSIS — I10 Essential (primary) hypertension: Secondary | ICD-10-CM | POA: Diagnosis not present

## 2024-01-15 DIAGNOSIS — I4821 Permanent atrial fibrillation: Secondary | ICD-10-CM | POA: Diagnosis not present

## 2024-01-15 DIAGNOSIS — I482 Chronic atrial fibrillation, unspecified: Secondary | ICD-10-CM | POA: Diagnosis not present

## 2024-01-18 DIAGNOSIS — J309 Allergic rhinitis, unspecified: Secondary | ICD-10-CM | POA: Diagnosis not present

## 2024-01-18 DIAGNOSIS — L309 Dermatitis, unspecified: Secondary | ICD-10-CM | POA: Diagnosis not present

## 2024-01-18 DIAGNOSIS — R059 Cough, unspecified: Secondary | ICD-10-CM | POA: Diagnosis not present

## 2024-01-24 DIAGNOSIS — E113311 Type 2 diabetes mellitus with moderate nonproliferative diabetic retinopathy with macular edema, right eye: Secondary | ICD-10-CM | POA: Diagnosis not present

## 2024-01-24 DIAGNOSIS — I4821 Permanent atrial fibrillation: Secondary | ICD-10-CM | POA: Diagnosis not present

## 2024-01-24 DIAGNOSIS — I10 Essential (primary) hypertension: Secondary | ICD-10-CM | POA: Diagnosis not present

## 2024-01-24 DIAGNOSIS — I482 Chronic atrial fibrillation, unspecified: Secondary | ICD-10-CM | POA: Diagnosis not present

## 2024-02-07 ENCOUNTER — Other Ambulatory Visit: Payer: Self-pay | Admitting: Cardiology

## 2024-02-13 DIAGNOSIS — E113311 Type 2 diabetes mellitus with moderate nonproliferative diabetic retinopathy with macular edema, right eye: Secondary | ICD-10-CM | POA: Diagnosis not present

## 2024-02-13 DIAGNOSIS — I10 Essential (primary) hypertension: Secondary | ICD-10-CM | POA: Diagnosis not present

## 2024-02-13 DIAGNOSIS — I4821 Permanent atrial fibrillation: Secondary | ICD-10-CM | POA: Diagnosis not present

## 2024-02-13 DIAGNOSIS — I482 Chronic atrial fibrillation, unspecified: Secondary | ICD-10-CM | POA: Diagnosis not present

## 2024-02-22 DIAGNOSIS — E782 Mixed hyperlipidemia: Secondary | ICD-10-CM | POA: Diagnosis not present

## 2024-02-22 DIAGNOSIS — E113311 Type 2 diabetes mellitus with moderate nonproliferative diabetic retinopathy with macular edema, right eye: Secondary | ICD-10-CM | POA: Diagnosis not present

## 2024-02-22 DIAGNOSIS — I4821 Permanent atrial fibrillation: Secondary | ICD-10-CM | POA: Diagnosis not present

## 2024-02-22 DIAGNOSIS — N400 Enlarged prostate without lower urinary tract symptoms: Secondary | ICD-10-CM | POA: Diagnosis not present

## 2024-02-22 DIAGNOSIS — I482 Chronic atrial fibrillation, unspecified: Secondary | ICD-10-CM | POA: Diagnosis not present

## 2024-02-22 DIAGNOSIS — I10 Essential (primary) hypertension: Secondary | ICD-10-CM | POA: Diagnosis not present

## 2024-02-29 ENCOUNTER — Other Ambulatory Visit: Payer: Self-pay | Admitting: Cardiology

## 2024-03-14 DIAGNOSIS — E113311 Type 2 diabetes mellitus with moderate nonproliferative diabetic retinopathy with macular edema, right eye: Secondary | ICD-10-CM | POA: Diagnosis not present

## 2024-03-14 DIAGNOSIS — I482 Chronic atrial fibrillation, unspecified: Secondary | ICD-10-CM | POA: Diagnosis not present

## 2024-03-14 DIAGNOSIS — I4821 Permanent atrial fibrillation: Secondary | ICD-10-CM | POA: Diagnosis not present

## 2024-03-14 DIAGNOSIS — I10 Essential (primary) hypertension: Secondary | ICD-10-CM | POA: Diagnosis not present

## 2024-03-24 DIAGNOSIS — I4821 Permanent atrial fibrillation: Secondary | ICD-10-CM | POA: Diagnosis not present

## 2024-03-24 DIAGNOSIS — I482 Chronic atrial fibrillation, unspecified: Secondary | ICD-10-CM | POA: Diagnosis not present

## 2024-03-24 DIAGNOSIS — E113311 Type 2 diabetes mellitus with moderate nonproliferative diabetic retinopathy with macular edema, right eye: Secondary | ICD-10-CM | POA: Diagnosis not present

## 2024-03-24 DIAGNOSIS — E782 Mixed hyperlipidemia: Secondary | ICD-10-CM | POA: Diagnosis not present

## 2024-03-24 DIAGNOSIS — I10 Essential (primary) hypertension: Secondary | ICD-10-CM | POA: Diagnosis not present

## 2024-03-24 DIAGNOSIS — N400 Enlarged prostate without lower urinary tract symptoms: Secondary | ICD-10-CM | POA: Diagnosis not present

## 2024-04-03 ENCOUNTER — Other Ambulatory Visit: Payer: Self-pay | Admitting: Cardiology

## 2024-04-03 DIAGNOSIS — I4821 Permanent atrial fibrillation: Secondary | ICD-10-CM

## 2024-04-03 NOTE — Telephone Encounter (Signed)
 Xarelto  20mg  refill request received. Pt is 82 years old, weight-70.8kg, Crea-0.75 on 12/21/23, last seen by Dr. Jeffrie on 02/28/23 and pending appt on 05/27/24, Diagnosis-Afib, CrCl-76.04 mL/min; Dose is appropriate based on dosing criteria. Will send in refill to requested pharmacy.

## 2024-04-05 NOTE — Progress Notes (Signed)
 [facility] - Clinic Note  04/09/2024     CHIEF COMPLAINT Patient presents for Retina Evaluation   HISTORY OF PRESENT ILLNESS: Robert Irwin is a 83 y.o. male who presents to the clinic today for:   HPI     Retina Evaluation   In right eye.  This started 1 month ago.  Duration of 1 month.  I, the attending physician,  performed the HPI with the patient and updated documentation appropriately.        Comments   Patient here for Retina Evaluation. Patient states vision sometimes like a screen moving around. Vision sometimes good and sometimes not good. No eye pain.       Last edited by Robert Rogue, MD on 04/16/2024  9:53 PM.     Patient feels like he is seeing a moving floater in his vision started about 2-3 months.  Referring physician: Fleeta Zerita ONEIDA, MD [address] [place],  [place] [address]  HISTORICAL INFORMATION:   Selected notes from the MEDICAL RECORD NUMBER Referred by Dr. Fleeta for macular edema OD LEE:  Ocular Hx- PMH-    CURRENT MEDICATIONS: No current outpatient medications on file. (Ophthalmic Drugs)   No current facility-administered medications for this visit. (Ophthalmic Drugs)   Current Outpatient Medications (Other)  Medication Sig   acetaminophen  (TYLENOL ) 325 MG tablet Take 2 tablets (650 mg total) by mouth every 6 (six) hours as needed for mild pain (or Fever >/= 101).   amoxicillin -clavulanate (AUGMENTIN ) 875-125 MG tablet Take 1 tablet by mouth every 12 (twelve) hours.   atorvastatin  (LIPITOR) 20 MG tablet TAKE 1 TABLET EVERY DAY (NEED MD APPOINTMENT)   benzonatate  (TESSALON ) 100 MG capsule Take 1 capsule (100 mg total) by mouth every 8 (eight) hours.   calcium  citrate-vitamin D  200-200 MG-UNIT TABS Take 1 tablet by mouth daily.   dicyclomine  (BENTYL ) 10 MG capsule Take 1 capsule (10 mg total) by mouth 3 (three) times daily as needed for spasms.   diltiazem  (TIADYLT  ER) 240 MG 24 hr capsule TAKE 1 CAPSULE EVERY DAY    Eszopiclone 3 MG TABS Take 3 mg by mouth at bedtime as needed.   glimepiride  (AMARYL ) 2 MG tablet Take 2 mg by mouth daily before breakfast.   Glucosamine-Chondroitin (OSTEO BI-FLEX REGULAR STRENGTH PO) Take 1 tablet by mouth 2 (two) times daily.   methocarbamol  (ROBAXIN ) 500 MG tablet Take 1 tablet (500 mg total) by mouth 3 (three) times daily as needed for muscle spasms.   metoCLOPramide  (REGLAN ) 10 MG tablet Take 0.5 tablets (5 mg total) by mouth every 6 (six) hours as needed for nausea (nausea/headache).   Multiple Vitamin (MULITIVITAMIN WITH MINERALS) TABS Take 1 tablet by mouth daily.   ondansetron  (ZOFRAN -ODT) 4 MG disintegrating tablet Take 1 tablet (4 mg total) by mouth every 8 (eight) hours as needed.   prochlorperazine  (COMPAZINE ) 10 MG tablet Take 1 tablet (10 mg total) by mouth every 6 (six) hours as needed for nausea or vomiting.   rivaroxaban  (XARELTO ) 20 MG TABS tablet TAKE 1 TABLET EVERY DAY WITH SUPPER   traMADol  (ULTRAM ) 50 MG tablet Take 1 tablet (50 mg total) by mouth every 6 (six) hours as needed for moderate pain.   No current facility-administered medications for this visit. (Other)   REVIEW OF SYSTEMS: ROS   Positive for: Endocrine, Eyes Negative for: Constitutional, Gastrointestinal, Neurological, Skin, Genitourinary, Musculoskeletal, HENT, Cardiovascular, Respiratory, Psychiatric, Allergic/Imm, Heme/Lymph Last edited by Orval Asberry RAMAN, COA on 04/09/2024  9:20  AM.     ALLERGIES No Known Allergies  PAST MEDICAL HISTORY Past Medical History:  Diagnosis Date   A-fib (HCC)    Arthritis    Atrial fibrillation (HCC)    Diabetes mellitus    Dysrhythmia    ED (erectile dysfunction)    Epistaxis    Hyperlipidemia    Hypertension    Past Surgical History:  Procedure Laterality Date   APPENDECTOMY     NASAL MASS EXCISION     NASAL SINUS SURGERY  12/16/2011   Procedure: ENDOSCOPIC SINUS SURGERY;  Surgeon: Lonni FORBES Angle, MD;  Location: [facility] OR;  Service:  ENT;  Laterality: N/A;  for control of epistaxis   TOTAL HIP ARTHROPLASTY Right 04/29/2021   Procedure: TOTAL HIP ARTHROPLASTY ANTERIOR APPROACH;  Surgeon: Fidel Rogue, MD;  Location: [facility] ORS;  Service: Orthopedics;  Laterality: Right;   FAMILY HISTORY History reviewed. No pertinent family history.  SOCIAL HISTORY Social History   Tobacco Use   Smoking status: Former    Current packs/day: 0.00    Types: Cigarettes    Quit date: 07/25/1990    Years since quitting: 33.7   Smokeless tobacco: Never  Vaping Use   Vaping status: Never Used  Substance Use Topics   Alcohol  use: Not Currently   Drug use: No       OPHTHALMIC EXAM:  Base Eye Exam     Visual Acuity (Snellen - Linear)       Right Left   Dist [place] 20/20 20/20 -2         Tonometry (Tonopen, 9:17 AM)       Right Left   Pressure 15 13         Pupils       Dark Light Shape React APD   Right 3 2 Round Brisk None   Left 3 2 Round Brisk None         Visual Fields (Counting fingers)       Left Right    Full Full         Extraocular Movement       Right Left    Full, Ortho Full, Ortho         Neuro/Psych     Oriented x3: Yes   Mood/Affect: Normal         Dilation     Both eyes: 1.0% Mydriacyl, 2.5% Phenylephrine  @ 9:17 AM           Slit Lamp and Fundus Exam     Slit Lamp Exam       Right Left   Lids/Lashes Dermatochalasis - upper lid Dermatochalasis - upper lid   Conjunctiva/Sclera Melanosis, nasal and temporal pinguecula Melanosis, nasal and temporal pinguecula   Cornea mild arcus, tear film debris, well healed cataract wound, trace Punctate epithelial erosions mild arcus, trace tear film debris, well healed cataract wound, trace Punctate epithelial erosions   Anterior Chamber deep and clear deep and clear   Iris Round and dilated, mild atrophy temporally, Transillumination defects Round and dilated   Lens 3 piece PC IOL in good position with open PC 3 piece PC IOL in good  position with open PC   Anterior Vitreous Vitreous syneresis, Posterior vitreous detachment Vitreous syneresis, Posterior vitreous detachment         Fundus Exam       Right Left   Disc Pink and Sharp, mild hyperemia -- stably improved, +PPP mild Pallor, Sharp rim, mild PPP   C/D Ratio 0.3  0.4   Macula Flat, Blunted foveal reflex, central cystic changes --stably  improved, RPE mottling, No heme Flat, Blunted foveal reflex, RPE mottling and clumping, No heme or edema   Vessels attenuated, Tortuous, copper wiring attenuated, mild tortuosity   Periphery Attached, 360 MA/DBH greatest temporally -- improved Attached, No heme           IMAGING AND PROCEDURES  Imaging and Procedures for 04/09/2024  OCT, Retina - OU - Both Eyes       Right Eye Quality was good. Central Foveal Thickness: 285. Progression has been stable. Findings include normal foveal contour, no IRF, no SRF (Stable resolution of cystic changes superior fovea).   Left Eye Quality was good. Central Foveal Thickness: 295. Progression has been stable. Findings include normal foveal contour, no IRF, no SRF.   Notes *Images captured and stored on drive  Diagnosis / Impression:  OD: stable resolution of cystic changes superior fovea -- h/o remote CRVO OS: NFP, no IRF/SRF  Clinical management:  See below  Abbreviations: NFP - Normal foveal profile. CME - cystoid macular edema. PED - pigment epithelial detachment. IRF - intraretinal fluid. SRF - subretinal fluid. EZ - ellipsoid zone. ERM - epiretinal membrane. ORA - outer retinal atrophy. ORT - outer retinal tubulation. SRHM - subretinal hyper-reflective material. IRHM - intraretinal hyper-reflective material            ASSESSMENT/PLAN:    ICD-10-CM   1. Stable central retinal vein occlusion of right eye  H34.8112 OCT, Retina - OU - Both Eyes    2. Diabetes mellitus type 2 without retinopathy (HCC)  E11.9     3. Long term (current) use of oral hypoglycemic  drugs  Z79.84     4. Essential hypertension  I10     5. Hypertensive retinopathy of both eyes  H35.033     6. Pseudophakia, both eyes  Z96.1      Remote CRVO w/ mild CME OD - BCVA OD 20/20 and pt with minimal symptoms or visual complaints -- stable - exam shows interval improvement in 360 DBH - OCT shows OD: Central IRF/cystic changes superior fovea stably improved - FA (03.11.24) shows Mild perifoveal petaloid leakage, hyper fluorescence of the disc, mild MA with leakage peripherally -- ?remote RVO - daughter reports BP may have spiked in Feb 2024, when pt's wife was diagnosed w/ gastric cancer - no treatment recommended at this time -- will monitor - F/U 9 mos, sooner prn -- DFE/OCT  2,3. Diabetes mellitus, type 2 without retinopathy - The incidence, risk factors for progression, natural history and treatment options for diabetic retinopathy  were discussed with patient.   - The need for close monitoring of blood glucose, blood pressure, and serum lipids, avoiding cigarette or any type of tobacco, and the need for long term follow up was also discussed with patient.  - FA 03.11.23 shows minimal MA OU - f/u in 1 year, sooner prn  4,5. Hypertensive retinopathy OU - discussed importance of tight BP control - monitor  6. Pseudophakia OU  - s/p CE/IOL  - IOL in good position, doing well  - monitor   Ophthalmic Meds Ordered this visit:  No orders of the defined types were placed in this encounter.    Return in about 9 months (around 01/07/2025) for f/u CRVO , DFE, OCT.  There are no Patient Instructions on file for this visit.  Explained the diagnoses, plan, and follow up with the patient and they expressed understanding.  Patient  expressed understanding of the importance of proper follow up care.   This document serves as a record of services personally performed by Redell JUDITHANN Hans, MD, PhD. It was created on their behalf by Wanda GEANNIE Keens, COT an ophthalmic technician.  The creation of this record is the provider's dictation and/or activities during the visit.    Electronically signed by:  Wanda GEANNIE Keens, COT  04/16/24 9:57 PM   Redell JUDITHANN Hans, M.D., Ph.D. Diseases & Surgery of the Retina and Vitreous [facility]  I have reviewed the above documentation for accuracy and completeness, and I agree with the above. Redell JUDITHANN Hans, M.D., Ph.D. 04/16/24 9:58 PM   Abbreviations: M myopia (nearsighted); A astigmatism; H hyperopia (farsighted); P presbyopia; Mrx spectacle prescription;  CTL contact lenses; OD right eye; OS left eye; OU both eyes  XT exotropia; ET esotropia; PEK punctate epithelial keratitis; PEE punctate epithelial erosions; DES dry eye syndrome; MGD meibomian gland dysfunction; ATs artificial tears; PFAT's preservative free artificial tears; NSC nuclear sclerotic cataract; PSC posterior subcapsular cataract; ERM epi-retinal membrane; PVD posterior vitreous detachment; RD retinal detachment; DM diabetes mellitus; DR diabetic retinopathy; NPDR non-proliferative diabetic retinopathy; PDR proliferative diabetic retinopathy; CSME clinically significant macular edema; DME diabetic macular edema; dbh dot blot hemorrhages; CWS cotton wool spot; POAG primary open angle glaucoma; C/D cup-to-disc ratio; HVF humphrey visual field; GVF goldmann visual field; OCT optical coherence tomography; IOP intraocular pressure; BRVO Branch retinal vein occlusion; CRVO central retinal vein occlusion; CRAO central retinal artery occlusion; BRAO branch retinal artery occlusion; RT retinal tear; SB scleral buckle; PPV pars plana vitrectomy; VH Vitreous hemorrhage; PRP panretinal laser photocoagulation; IVK intravitreal kenalog ; VMT vitreomacular traction; MH Macular hole;  NVD neovascularization of the disc; NVE neovascularization elsewhere; AREDS age related eye disease study; ARMD age related macular degeneration; POAG primary open angle glaucoma; EBMD  epithelial/anterior basement membrane dystrophy; ACIOL anterior chamber intraocular lens; IOL intraocular lens; PCIOL posterior chamber intraocular lens; Phaco/IOL phacoemulsification with intraocular lens placement; PRK photorefractive keratectomy; LASIK laser assisted in situ keratomileusis; HTN hypertension; DM diabetes mellitus; COPD chronic obstructive pulmonary disease

## 2024-04-09 ENCOUNTER — Ambulatory Visit (INDEPENDENT_AMBULATORY_CARE_PROVIDER_SITE_OTHER): Admitting: Ophthalmology

## 2024-04-09 ENCOUNTER — Encounter (INDEPENDENT_AMBULATORY_CARE_PROVIDER_SITE_OTHER): Payer: Self-pay | Admitting: Ophthalmology

## 2024-04-09 DIAGNOSIS — E119 Type 2 diabetes mellitus without complications: Secondary | ICD-10-CM

## 2024-04-09 DIAGNOSIS — H348112 Central retinal vein occlusion, right eye, stable: Secondary | ICD-10-CM

## 2024-04-09 DIAGNOSIS — H34811 Central retinal vein occlusion, right eye, with macular edema: Secondary | ICD-10-CM

## 2024-04-09 DIAGNOSIS — Z7984 Long term (current) use of oral hypoglycemic drugs: Secondary | ICD-10-CM | POA: Diagnosis not present

## 2024-04-09 DIAGNOSIS — I10 Essential (primary) hypertension: Secondary | ICD-10-CM

## 2024-04-09 DIAGNOSIS — Z961 Presence of intraocular lens: Secondary | ICD-10-CM | POA: Diagnosis not present

## 2024-04-09 DIAGNOSIS — H35033 Hypertensive retinopathy, bilateral: Secondary | ICD-10-CM | POA: Diagnosis not present

## 2024-04-13 DIAGNOSIS — I10 Essential (primary) hypertension: Secondary | ICD-10-CM | POA: Diagnosis not present

## 2024-04-13 DIAGNOSIS — E113311 Type 2 diabetes mellitus with moderate nonproliferative diabetic retinopathy with macular edema, right eye: Secondary | ICD-10-CM | POA: Diagnosis not present

## 2024-04-13 DIAGNOSIS — I4821 Permanent atrial fibrillation: Secondary | ICD-10-CM | POA: Diagnosis not present

## 2024-04-13 DIAGNOSIS — I482 Chronic atrial fibrillation, unspecified: Secondary | ICD-10-CM | POA: Diagnosis not present

## 2024-04-16 ENCOUNTER — Encounter (INDEPENDENT_AMBULATORY_CARE_PROVIDER_SITE_OTHER): Payer: Self-pay | Admitting: Ophthalmology

## 2024-04-23 DIAGNOSIS — E113311 Type 2 diabetes mellitus with moderate nonproliferative diabetic retinopathy with macular edema, right eye: Secondary | ICD-10-CM | POA: Diagnosis not present

## 2024-04-23 DIAGNOSIS — I4821 Permanent atrial fibrillation: Secondary | ICD-10-CM | POA: Diagnosis not present

## 2024-04-23 DIAGNOSIS — N400 Enlarged prostate without lower urinary tract symptoms: Secondary | ICD-10-CM | POA: Diagnosis not present

## 2024-04-23 DIAGNOSIS — I482 Chronic atrial fibrillation, unspecified: Secondary | ICD-10-CM | POA: Diagnosis not present

## 2024-04-23 DIAGNOSIS — E782 Mixed hyperlipidemia: Secondary | ICD-10-CM | POA: Diagnosis not present

## 2024-04-23 DIAGNOSIS — I10 Essential (primary) hypertension: Secondary | ICD-10-CM | POA: Diagnosis not present

## 2024-05-10 DIAGNOSIS — K8020 Calculus of gallbladder without cholecystitis without obstruction: Secondary | ICD-10-CM | POA: Diagnosis not present

## 2024-05-10 DIAGNOSIS — I10 Essential (primary) hypertension: Secondary | ICD-10-CM | POA: Diagnosis not present

## 2024-05-10 DIAGNOSIS — E113311 Type 2 diabetes mellitus with moderate nonproliferative diabetic retinopathy with macular edema, right eye: Secondary | ICD-10-CM | POA: Diagnosis not present

## 2024-05-10 DIAGNOSIS — R1013 Epigastric pain: Secondary | ICD-10-CM | POA: Diagnosis not present

## 2024-05-10 DIAGNOSIS — M109 Gout, unspecified: Secondary | ICD-10-CM | POA: Diagnosis not present

## 2024-05-10 DIAGNOSIS — I700 Atherosclerosis of aorta: Secondary | ICD-10-CM | POA: Diagnosis not present

## 2024-05-10 DIAGNOSIS — K4090 Unilateral inguinal hernia, without obstruction or gangrene, not specified as recurrent: Secondary | ICD-10-CM | POA: Diagnosis not present

## 2024-05-10 DIAGNOSIS — D6869 Other thrombophilia: Secondary | ICD-10-CM | POA: Diagnosis not present

## 2024-05-10 DIAGNOSIS — I4821 Permanent atrial fibrillation: Secondary | ICD-10-CM | POA: Diagnosis not present

## 2024-05-12 ENCOUNTER — Other Ambulatory Visit: Payer: Self-pay | Admitting: Cardiology

## 2024-05-13 DIAGNOSIS — I4821 Permanent atrial fibrillation: Secondary | ICD-10-CM | POA: Diagnosis not present

## 2024-05-13 DIAGNOSIS — I10 Essential (primary) hypertension: Secondary | ICD-10-CM | POA: Diagnosis not present

## 2024-05-13 DIAGNOSIS — E113311 Type 2 diabetes mellitus with moderate nonproliferative diabetic retinopathy with macular edema, right eye: Secondary | ICD-10-CM | POA: Diagnosis not present

## 2024-05-13 DIAGNOSIS — I482 Chronic atrial fibrillation, unspecified: Secondary | ICD-10-CM | POA: Diagnosis not present

## 2024-05-24 DIAGNOSIS — N400 Enlarged prostate without lower urinary tract symptoms: Secondary | ICD-10-CM | POA: Diagnosis not present

## 2024-05-24 DIAGNOSIS — I482 Chronic atrial fibrillation, unspecified: Secondary | ICD-10-CM | POA: Diagnosis not present

## 2024-05-24 DIAGNOSIS — I4821 Permanent atrial fibrillation: Secondary | ICD-10-CM | POA: Diagnosis not present

## 2024-05-24 DIAGNOSIS — I10 Essential (primary) hypertension: Secondary | ICD-10-CM | POA: Diagnosis not present

## 2024-05-24 DIAGNOSIS — E113311 Type 2 diabetes mellitus with moderate nonproliferative diabetic retinopathy with macular edema, right eye: Secondary | ICD-10-CM | POA: Diagnosis not present

## 2024-05-24 DIAGNOSIS — E782 Mixed hyperlipidemia: Secondary | ICD-10-CM | POA: Diagnosis not present

## 2024-05-27 ENCOUNTER — Encounter: Payer: Self-pay | Admitting: Cardiology

## 2024-05-27 ENCOUNTER — Ambulatory Visit: Attending: Cardiology | Admitting: Cardiology

## 2024-05-27 VITALS — BP 146/59 | HR 86 | Ht 67.0 in | Wt 152.0 lb

## 2024-05-27 DIAGNOSIS — E119 Type 2 diabetes mellitus without complications: Secondary | ICD-10-CM | POA: Diagnosis not present

## 2024-05-27 DIAGNOSIS — I4821 Permanent atrial fibrillation: Secondary | ICD-10-CM

## 2024-05-27 DIAGNOSIS — I10 Essential (primary) hypertension: Secondary | ICD-10-CM | POA: Diagnosis not present

## 2024-05-27 DIAGNOSIS — E785 Hyperlipidemia, unspecified: Secondary | ICD-10-CM

## 2024-05-27 NOTE — Progress Notes (Signed)
  Cardiology Office Note:  .   Date:  05/27/2024  ID:  Braxson, Hollingsworth 1940-08-13, MRN [id number] PCP: Ransom Other, MD  [facility] Providers Cardiologist:  Oneil Parchment, MD    History of Present Illness: .   Robert Irwin is a 83 y.o. male Discussed the use of AI scribe software for clinical note transcription with the patient, who gave verbal consent to proceed.  History of Present Illness Robert Irwin is an 83 year old male with atrial fibrillation and diabetes who presents for a cardiovascular follow-up.  He has a history of atrial fibrillation, which is well-controlled with diltiazem  240 mg daily and Xarelto  20 mg daily for anticoagulation. He has been on these medications for years and is doing well with them.  He also has a history of diabetes, with a recent hemoglobin A1c of 6.5. He is on glimepiride  2 mg daily for diabetes management.  He underwent hip replacement surgery in 2022 and sometimes experiences gout, although it is currently resolved. He engages in daily exercises including stretching, sit-ups, and leg movements, performing sit-ups 35 times, three times a day, and leg movements 300 times a day. He has a special room at home for exercise and avoids exercises that cause joint pain.  He checks his blood pressure regularly and reports that it is generally well-controlled at home, although it was slightly elevated during a recent visit.  He has been taking eszopiclone 3 mg at bedtime since his wife passed away a year and three months ago, as he finds it difficult to stay alone at night. 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      ROS: No CP, no SOB  Studies Reviewed: .        Results LABS   Hemoglobin A1c: 6.5% Risk Assessment/Calculations:           Physical Exam:   VS:  BP (!) 146/59   Pulse 86   Ht 5' 7 (1.702 m)   Wt 152 lb (68.9 kg)   SpO2 97%   BMI 23.81 kg/m    Wt Readings from Last 3 Encounters:  05/27/24 152 lb (68.9  kg)  02/28/23 156 lb (70.8 kg)  01/28/23 152 lb (68.9 kg)    GEN: Well nourished, well developed in no acute distress NECK: No JVD; No carotid bruits CARDIAC: irreg irreg, no murmurs, no rubs, no gallops RESPIRATORY:  Clear to auscultation without rales, wheezing or rhonchi  ABDOMEN: Soft, non-tender, non-distended EXTREMITIES:  No edema; No deformity   ASSESSMENT AND PLAN: .    Assessment and Plan Assessment & Plan Permanent atrial fibrillation Well-controlled with current medication regimen. He is on diltiazem  240 mg daily and Xarelto  20 mg daily for stroke prevention. - Continue diltiazem  240 mg oral long-acting daily. - Continue Xarelto  20 mg oral daily.  Type 2 diabetes mellitus Well-managed with a hemoglobin A1c of 6.5%. He is on glimepiride  2 mg daily. - Continue glimepiride  2 mg oral daily.  Insomnia Managed with eszopiclone 3 mg at bedtime. He has been using this medication for over a year due to grief following the passing of his wife. Discussed the possibility of weaning off the medication over time. - Continue eszopiclone 3 mg oral at bedtime. - Encouraged gradual weaning off eszopiclone when feasible.          Signed, Oneil Parchment, MD

## 2024-05-27 NOTE — Patient Instructions (Signed)

## 2024-06-12 DIAGNOSIS — E113311 Type 2 diabetes mellitus with moderate nonproliferative diabetic retinopathy with macular edema, right eye: Secondary | ICD-10-CM | POA: Diagnosis not present

## 2024-06-12 DIAGNOSIS — I10 Essential (primary) hypertension: Secondary | ICD-10-CM | POA: Diagnosis not present

## 2024-06-12 DIAGNOSIS — I4821 Permanent atrial fibrillation: Secondary | ICD-10-CM | POA: Diagnosis not present

## 2024-06-12 DIAGNOSIS — I482 Chronic atrial fibrillation, unspecified: Secondary | ICD-10-CM | POA: Diagnosis not present

## 2024-06-23 DIAGNOSIS — I10 Essential (primary) hypertension: Secondary | ICD-10-CM | POA: Diagnosis not present

## 2024-06-23 DIAGNOSIS — N400 Enlarged prostate without lower urinary tract symptoms: Secondary | ICD-10-CM | POA: Diagnosis not present

## 2024-06-23 DIAGNOSIS — I4821 Permanent atrial fibrillation: Secondary | ICD-10-CM | POA: Diagnosis not present

## 2024-06-23 DIAGNOSIS — I482 Chronic atrial fibrillation, unspecified: Secondary | ICD-10-CM | POA: Diagnosis not present

## 2024-06-23 DIAGNOSIS — E113311 Type 2 diabetes mellitus with moderate nonproliferative diabetic retinopathy with macular edema, right eye: Secondary | ICD-10-CM | POA: Diagnosis not present

## 2024-06-23 DIAGNOSIS — E782 Mixed hyperlipidemia: Secondary | ICD-10-CM | POA: Diagnosis not present

## 2024-06-24 NOTE — Progress Notes (Shared)
 [facility] - Clinic Note  07/08/2024     CHIEF COMPLAINT Patient presents for No chief complaint on file.   HISTORY OF PRESENT ILLNESS: Robert Irwin is a 83 y.o. male who presents to the clinic today for:     Patient feels like he is seeing a moving floater in his vision started about 2-3 months.  Referring physician: Fleeta Zerita ONEIDA, MD [address] [place],  [place] [address]  HISTORICAL INFORMATION:   Selected notes from the MEDICAL RECORD NUMBER Referred by Dr. Fleeta for macular edema OD LEE:  Ocular Hx- PMH-    CURRENT MEDICATIONS: No current outpatient medications on file. (Ophthalmic Drugs)   No current facility-administered medications for this visit. (Ophthalmic Drugs)   Current Outpatient Medications (Other)  Medication Sig   acetaminophen  (TYLENOL ) 325 MG tablet Take 2 tablets (650 mg total) by mouth every 6 (six) hours as needed for mild pain (or Fever >/= 101).   amoxicillin -clavulanate (AUGMENTIN ) 875-125 MG tablet Take 1 tablet by mouth every 12 (twelve) hours.   atorvastatin  (LIPITOR) 20 MG tablet TAKE 1 TABLET EVERY DAY (NEED MD APPOINTMENT)   benzonatate  (TESSALON ) 100 MG capsule Take 1 capsule (100 mg total) by mouth every 8 (eight) hours.   calcium  citrate-vitamin D  200-200 MG-UNIT TABS Take 1 tablet by mouth daily.   dicyclomine  (BENTYL ) 10 MG capsule Take 1 capsule (10 mg total) by mouth 3 (three) times daily as needed for spasms.   diltiazem  (TIADYLT  ER) 240 MG 24 hr capsule TAKE 1 CAPSULE EVERY DAY   Eszopiclone 3 MG TABS Take 3 mg by mouth at bedtime as needed.   glimepiride  (AMARYL ) 2 MG tablet Take 2 mg by mouth daily before breakfast.   Glucosamine-Chondroitin (OSTEO BI-FLEX REGULAR STRENGTH PO) Take 1 tablet by mouth 2 (two) times daily.   methocarbamol  (ROBAXIN ) 500 MG tablet Take 1 tablet (500 mg total) by mouth 3 (three) times daily as needed for muscle spasms.   metoCLOPramide  (REGLAN ) 10 MG tablet Take 0.5 tablets  (5 mg total) by mouth every 6 (six) hours as needed for nausea (nausea/headache).   Multiple Vitamin (MULITIVITAMIN WITH MINERALS) TABS Take 1 tablet by mouth daily.   ondansetron  (ZOFRAN -ODT) 4 MG disintegrating tablet Take 1 tablet (4 mg total) by mouth every 8 (eight) hours as needed.   prochlorperazine  (COMPAZINE ) 10 MG tablet Take 1 tablet (10 mg total) by mouth every 6 (six) hours as needed for nausea or vomiting.   rivaroxaban  (XARELTO ) 20 MG TABS tablet TAKE 1 TABLET EVERY DAY WITH SUPPER   traMADol  (ULTRAM ) 50 MG tablet Take 1 tablet (50 mg total) by mouth every 6 (six) hours as needed for moderate pain.   No current facility-administered medications for this visit. (Other)   REVIEW OF SYSTEMS:   ALLERGIES No Known Allergies  PAST MEDICAL HISTORY Past Medical History:  Diagnosis Date   A-fib (HCC)    Arthritis    Atrial fibrillation (HCC)    Diabetes mellitus    Dysrhythmia    ED (erectile dysfunction)    Epistaxis    Hyperlipidemia    Hypertension    Past Surgical History:  Procedure Laterality Date   APPENDECTOMY     NASAL MASS EXCISION     NASAL SINUS SURGERY  12/16/2011   Procedure: ENDOSCOPIC SINUS SURGERY;  Surgeon: Lonni FORBES Angle, MD;  Location: [facility] OR;  Service: ENT;  Laterality: N/A;  for control of epistaxis   TOTAL HIP ARTHROPLASTY Right 04/29/2021  Procedure: TOTAL HIP ARTHROPLASTY ANTERIOR APPROACH;  Surgeon: Fidel Rogue, MD;  Location: [facility] ORS;  Service: Orthopedics;  Laterality: Right;   FAMILY HISTORY No family history on file.  SOCIAL HISTORY Social History   Tobacco Use   Smoking status: Former    Current packs/day: 0.00    Types: Cigarettes    Quit date: 07/25/1990    Years since quitting: 33.9   Smokeless tobacco: Never  Vaping Use   Vaping status: Never Used  Substance Use Topics   Alcohol  use: Not Currently   Drug use: No       OPHTHALMIC EXAM:  Not recorded    IMAGING AND PROCEDURES  Imaging and Procedures for  07/08/2024          ASSESSMENT/PLAN:    ICD-10-CM   1. Stable central retinal vein occlusion of right eye (HCC)  H34.8112     2. Diabetes mellitus type 2 without retinopathy (HCC)  E11.9     3. Long term (current) use of oral hypoglycemic drugs  Z79.84     4. Essential hypertension  I10     5. Hypertensive retinopathy of both eyes  H35.033     6. Pseudophakia, both eyes  Z96.1       Remote CRVO w/ mild CME OD - BCVA OD 20/20 and pt with minimal symptoms or visual complaints -- stable - exam shows interval improvement in 360 DBH - OCT shows OD: Central IRF/cystic changes superior fovea stably improved - FA (03.11.24) shows Mild perifoveal petaloid leakage, hyper fluorescence of the disc, mild MA with leakage peripherally -- ?remote RVO - daughter reports BP may have spiked in Feb 2024, when pt's wife was diagnosed w/ gastric cancer - no treatment recommended at this time -- will monitor - F/U 9 mos, sooner prn -- DFE/OCT  2,3. Diabetes mellitus, type 2 without retinopathy - The incidence, risk factors for progression, natural history and treatment options for diabetic retinopathy  were discussed with patient.   - The need for close monitoring of blood glucose, blood pressure, and serum lipids, avoiding cigarette or any type of tobacco, and the need for long term follow up was also discussed with patient.  - FA 03.11.23 shows minimal MA OU - f/u in 1 year, sooner prn  4,5. Hypertensive retinopathy OU - discussed importance of tight BP control - monitor  6. Pseudophakia OU  - s/p CE/IOL  - IOL in good position, doing well  - monitor  Ophthalmic Meds Ordered this visit:  No orders of the defined types were placed in this encounter.    No follow-ups on file.  There are no Patient Instructions on file for this visit.  Explained the diagnoses, plan, and follow up with the patient and they expressed understanding.  Patient expressed understanding of the importance of  proper follow up care.   This document serves as a record of services personally performed by Rogue JUDITHANN Hans, MD, PhD. It was created on their behalf by Avelina Pereyra, COA an ophthalmic technician. The creation of this record is the provider's dictation and/or activities during the visit.   Electronically signed by: Avelina GORMAN Pereyra, COT  06/24/24  10:47 AM   [place]ogue JUDITHANN Hans, M.D., Ph.D. Diseases & Surgery of the Retina and Vitreous [facility] 07/08/2024  Abbreviations: M myopia (nearsighted); A astigmatism; H hyperopia (farsighted); P presbyopia; Mrx spectacle prescription;  CTL contact lenses; OD right eye; OS left eye; OU both eyes  XT exotropia; ET  esotropia; PEK punctate epithelial keratitis; PEE punctate epithelial erosions; DES dry eye syndrome; MGD meibomian gland dysfunction; ATs artificial tears; PFAT's preservative free artificial tears; NSC nuclear sclerotic cataract; PSC posterior subcapsular cataract; ERM epi-retinal membrane; PVD posterior vitreous detachment; RD retinal detachment; DM diabetes mellitus; DR diabetic retinopathy; NPDR non-proliferative diabetic retinopathy; PDR proliferative diabetic retinopathy; CSME clinically significant macular edema; DME diabetic macular edema; dbh dot blot hemorrhages; CWS cotton wool spot; POAG primary open angle glaucoma; C/D cup-to-disc ratio; HVF humphrey visual field; GVF goldmann visual field; OCT optical coherence tomography; IOP intraocular pressure; BRVO Branch retinal vein occlusion; CRVO central retinal vein occlusion; CRAO central retinal artery occlusion; BRAO branch retinal artery occlusion; RT retinal tear; SB scleral buckle; PPV pars plana vitrectomy; VH Vitreous hemorrhage; PRP panretinal laser photocoagulation; IVK intravitreal kenalog ; VMT vitreomacular traction; MH Macular hole;  NVD neovascularization of the disc; NVE neovascularization elsewhere; AREDS age related eye disease study; ARMD age related  macular degeneration; POAG primary open angle glaucoma; EBMD epithelial/anterior basement membrane dystrophy; ACIOL anterior chamber intraocular lens; IOL intraocular lens; PCIOL posterior chamber intraocular lens; Phaco/IOL phacoemulsification with intraocular lens placement; PRK photorefractive keratectomy; LASIK laser assisted in situ keratomileusis; HTN hypertension; DM diabetes mellitus; COPD chronic obstructive pulmonary disease

## 2024-07-02 DIAGNOSIS — R059 Cough, unspecified: Secondary | ICD-10-CM | POA: Diagnosis not present

## 2024-07-02 DIAGNOSIS — J309 Allergic rhinitis, unspecified: Secondary | ICD-10-CM | POA: Diagnosis not present

## 2024-07-02 DIAGNOSIS — G4700 Insomnia, unspecified: Secondary | ICD-10-CM | POA: Diagnosis not present

## 2024-07-08 ENCOUNTER — Encounter (INDEPENDENT_AMBULATORY_CARE_PROVIDER_SITE_OTHER): Payer: Medicare HMO | Admitting: Ophthalmology

## 2024-07-08 DIAGNOSIS — I10 Essential (primary) hypertension: Secondary | ICD-10-CM

## 2024-07-08 DIAGNOSIS — H348112 Central retinal vein occlusion, right eye, stable: Secondary | ICD-10-CM

## 2024-07-08 DIAGNOSIS — Z7984 Long term (current) use of oral hypoglycemic drugs: Secondary | ICD-10-CM

## 2024-07-08 DIAGNOSIS — Z961 Presence of intraocular lens: Secondary | ICD-10-CM

## 2024-07-08 DIAGNOSIS — E119 Type 2 diabetes mellitus without complications: Secondary | ICD-10-CM

## 2024-07-08 DIAGNOSIS — H35033 Hypertensive retinopathy, bilateral: Secondary | ICD-10-CM

## 2024-07-25 ENCOUNTER — Other Ambulatory Visit: Payer: Self-pay | Admitting: Cardiology

## 2024-08-28 ENCOUNTER — Other Ambulatory Visit: Payer: Self-pay | Admitting: Cardiology

## 2024-08-28 DIAGNOSIS — I4821 Permanent atrial fibrillation: Secondary | ICD-10-CM

## 2024-08-28 NOTE — Telephone Encounter (Signed)
 Xarelto  20mg  refill request received. Pt is 84 years old, weight-68.9kg, Crea-0.75 on 12/21/23, last seen by Dr. Jeffrie on 05/27/24, Diagnosis-Afib, CrCl- 74 mL/min; Dose is appropriate based on dosing criteria. Will send in refill to requested pharmacy.

## 2025-01-07 ENCOUNTER — Encounter (INDEPENDENT_AMBULATORY_CARE_PROVIDER_SITE_OTHER): Admitting: Ophthalmology
